# Patient Record
Sex: Female | Born: 1955
Health system: Southern US, Community
[De-identification: ages and names within clinical notes are randomized; demographics above are authoritative.]

## PROBLEM LIST (undated history)

## (undated) DIAGNOSIS — G4730 Sleep apnea, unspecified: Secondary | ICD-10-CM

## (undated) DIAGNOSIS — Z8489 Family history of other specified conditions: Secondary | ICD-10-CM

## (undated) DIAGNOSIS — Z86718 Personal history of other venous thrombosis and embolism: Secondary | ICD-10-CM

## (undated) DIAGNOSIS — R002 Palpitations: Secondary | ICD-10-CM

## (undated) DIAGNOSIS — M549 Dorsalgia, unspecified: Secondary | ICD-10-CM

## (undated) DIAGNOSIS — K219 Gastro-esophageal reflux disease without esophagitis: Secondary | ICD-10-CM

## (undated) DIAGNOSIS — I10 Essential (primary) hypertension: Secondary | ICD-10-CM

## (undated) DIAGNOSIS — Z800 Family history of malignant neoplasm of digestive organs: Secondary | ICD-10-CM

## (undated) DIAGNOSIS — Z923 Personal history of irradiation: Secondary | ICD-10-CM

## (undated) DIAGNOSIS — E785 Hyperlipidemia, unspecified: Secondary | ICD-10-CM

## (undated) DIAGNOSIS — Z803 Family history of malignant neoplasm of breast: Secondary | ICD-10-CM

## (undated) DIAGNOSIS — R600 Localized edema: Secondary | ICD-10-CM

## (undated) DIAGNOSIS — I471 Supraventricular tachycardia, unspecified: Secondary | ICD-10-CM

## (undated) DIAGNOSIS — K5900 Constipation, unspecified: Secondary | ICD-10-CM

## (undated) DIAGNOSIS — R011 Cardiac murmur, unspecified: Secondary | ICD-10-CM

## (undated) DIAGNOSIS — C801 Malignant (primary) neoplasm, unspecified: Secondary | ICD-10-CM

## (undated) HISTORY — DX: Supraventricular tachycardia, unspecified: I47.10

## (undated) HISTORY — DX: Constipation, unspecified: K59.00

## (undated) HISTORY — DX: Personal history of other venous thrombosis and embolism: Z86.718

## (undated) HISTORY — DX: Family history of malignant neoplasm of digestive organs: Z80.0

## (undated) HISTORY — DX: Family history of malignant neoplasm of breast: Z80.3

## (undated) HISTORY — DX: Localized edema: R60.0

## (undated) HISTORY — PX: HERNIA REPAIR: SHX51

## (undated) HISTORY — DX: Supraventricular tachycardia: I47.1

## (undated) HISTORY — DX: Dorsalgia, unspecified: M54.9

## (undated) HISTORY — DX: Palpitations: R00.2

## (undated) HISTORY — PX: ABDOMINAL HYSTERECTOMY: SHX81

---

## 1997-05-20 ENCOUNTER — Encounter: Admission: RE | Admit: 1997-05-20 | Discharge: 1997-05-20 | Payer: Self-pay | Admitting: Family Medicine

## 1997-05-25 ENCOUNTER — Encounter: Admission: RE | Admit: 1997-05-25 | Discharge: 1997-05-25 | Payer: Self-pay | Admitting: Family Medicine

## 1997-07-18 ENCOUNTER — Encounter: Admission: RE | Admit: 1997-07-18 | Discharge: 1997-07-18 | Payer: Self-pay | Admitting: Family Medicine

## 1997-08-10 ENCOUNTER — Encounter: Admission: RE | Admit: 1997-08-10 | Discharge: 1997-08-10 | Payer: Self-pay | Admitting: Family Medicine

## 2001-12-31 ENCOUNTER — Encounter: Payer: Self-pay | Admitting: Emergency Medicine

## 2001-12-31 ENCOUNTER — Emergency Department (HOSPITAL_COMMUNITY): Admission: EM | Admit: 2001-12-31 | Discharge: 2002-01-01 | Payer: Self-pay | Admitting: Emergency Medicine

## 2002-06-23 ENCOUNTER — Encounter: Admission: RE | Admit: 2002-06-23 | Discharge: 2002-06-23 | Payer: Self-pay | Admitting: Internal Medicine

## 2002-06-23 ENCOUNTER — Encounter: Payer: Self-pay | Admitting: Internal Medicine

## 2003-05-20 ENCOUNTER — Other Ambulatory Visit: Admission: RE | Admit: 2003-05-20 | Discharge: 2003-05-20 | Payer: Self-pay | Admitting: Obstetrics and Gynecology

## 2004-04-14 ENCOUNTER — Emergency Department (HOSPITAL_COMMUNITY): Admission: EM | Admit: 2004-04-14 | Discharge: 2004-04-14 | Payer: Self-pay | Admitting: Emergency Medicine

## 2007-01-30 DIAGNOSIS — J309 Allergic rhinitis, unspecified: Secondary | ICD-10-CM | POA: Insufficient documentation

## 2007-01-30 DIAGNOSIS — E1169 Type 2 diabetes mellitus with other specified complication: Secondary | ICD-10-CM | POA: Insufficient documentation

## 2007-01-30 DIAGNOSIS — I10 Essential (primary) hypertension: Secondary | ICD-10-CM | POA: Insufficient documentation

## 2007-01-30 DIAGNOSIS — E785 Hyperlipidemia, unspecified: Secondary | ICD-10-CM | POA: Insufficient documentation

## 2008-10-27 ENCOUNTER — Encounter: Admission: RE | Admit: 2008-10-27 | Discharge: 2008-10-27 | Payer: Self-pay | Admitting: Internal Medicine

## 2011-03-17 ENCOUNTER — Other Ambulatory Visit: Payer: Self-pay

## 2011-03-17 ENCOUNTER — Observation Stay (HOSPITAL_COMMUNITY)
Admission: EM | Admit: 2011-03-17 | Discharge: 2011-03-18 | Disposition: A | Discharge status: Home / Self Care | Payer: 59 | Attending: Internal Medicine | Admitting: Internal Medicine

## 2011-03-17 ENCOUNTER — Encounter (HOSPITAL_COMMUNITY): Payer: Self-pay | Admitting: *Deleted

## 2011-03-17 DIAGNOSIS — R000 Tachycardia, unspecified: Secondary | ICD-10-CM

## 2011-03-17 DIAGNOSIS — I498 Other specified cardiac arrhythmias: Principal | ICD-10-CM | POA: Insufficient documentation

## 2011-03-17 DIAGNOSIS — I10 Essential (primary) hypertension: Secondary | ICD-10-CM | POA: Insufficient documentation

## 2011-03-17 DIAGNOSIS — I471 Supraventricular tachycardia: Secondary | ICD-10-CM

## 2011-03-17 DIAGNOSIS — R002 Palpitations: Secondary | ICD-10-CM | POA: Insufficient documentation

## 2011-03-17 DIAGNOSIS — E876 Hypokalemia: Secondary | ICD-10-CM | POA: Insufficient documentation

## 2011-03-17 DIAGNOSIS — E785 Hyperlipidemia, unspecified: Secondary | ICD-10-CM | POA: Insufficient documentation

## 2011-03-17 DIAGNOSIS — E119 Type 2 diabetes mellitus without complications: Secondary | ICD-10-CM

## 2011-03-17 DIAGNOSIS — R079 Chest pain, unspecified: Secondary | ICD-10-CM | POA: Insufficient documentation

## 2011-03-17 DIAGNOSIS — E1169 Type 2 diabetes mellitus with other specified complication: Secondary | ICD-10-CM | POA: Insufficient documentation

## 2011-03-17 DIAGNOSIS — R0602 Shortness of breath: Secondary | ICD-10-CM | POA: Insufficient documentation

## 2011-03-17 HISTORY — DX: Hyperlipidemia, unspecified: E78.5

## 2011-03-17 HISTORY — DX: Essential (primary) hypertension: I10

## 2011-03-17 LAB — DIFFERENTIAL
Basophils Absolute: 0.1 10*3/uL (ref 0.0–0.1)
Basophils Relative: 1 % (ref 0–1)
Eosinophils Absolute: 0.2 10*3/uL (ref 0.0–0.7)
Eosinophils Relative: 3 % (ref 0–5)
Lymphocytes Relative: 24 % (ref 12–46)
Lymphs Abs: 2 10*3/uL (ref 0.7–4.0)
Monocytes Absolute: 0.6 10*3/uL (ref 0.1–1.0)
Monocytes Relative: 7 % (ref 3–12)
Neutro Abs: 5.5 10*3/uL (ref 1.7–7.7)
Neutrophils Relative %: 65 % (ref 43–77)

## 2011-03-17 LAB — CBC
HCT: 39.8 % (ref 36.0–46.0)
HCT: 36.6 % (ref 36.0–46.0)
Hemoglobin: 13.2 g/dL (ref 12.0–15.0)
Hemoglobin: 11.9 g/dL — ABNORMAL LOW (ref 12.0–15.0)
MCH: 26.6 pg (ref 26.0–34.0)
MCH: 26 pg (ref 26.0–34.0)
MCHC: 33.2 g/dL (ref 30.0–36.0)
MCHC: 32.5 g/dL (ref 30.0–36.0)
MCV: 80.2 fL (ref 78.0–100.0)
MCV: 80.1 fL (ref 78.0–100.0)
Platelets: 265 10*3/uL (ref 150–400)
Platelets: 242 10*3/uL (ref 150–400)
RBC: 4.96 MIL/uL (ref 3.87–5.11)
RBC: 4.57 MIL/uL (ref 3.87–5.11)
RDW: 14.4 % (ref 11.5–15.5)
RDW: 14.5 % (ref 11.5–15.5)
WBC: 8.4 10*3/uL (ref 4.0–10.5)
WBC: 8.9 10*3/uL (ref 4.0–10.5)

## 2011-03-17 LAB — TROPONIN I
Troponin I: 0.37 ng/mL (ref <0.30)
Troponin I: 0.36 ng/mL (ref <0.30)

## 2011-03-17 LAB — PROTIME-INR
INR: 0.91 (ref 0.00–1.49)
Prothrombin Time: 12.5 seconds (ref 11.6–15.2)

## 2011-03-17 LAB — BASIC METABOLIC PANEL
BUN: 21 mg/dL (ref 6–23)
CO2: 25 mEq/L (ref 19–32)
Calcium: 10.1 mg/dL (ref 8.4–10.5)
Chloride: 101 mEq/L (ref 96–112)
Creatinine, Ser: 1.12 mg/dL — ABNORMAL HIGH (ref 0.50–1.10)
GFR calc Af Amer: 63 mL/min — ABNORMAL LOW (ref >90)
GFR calc non Af Amer: 54 mL/min — ABNORMAL LOW (ref >90)
Glucose, Bld: 95 mg/dL (ref 70–99)
Potassium: 3 mEq/L — ABNORMAL LOW (ref 3.5–5.1)
Sodium: 140 mEq/L (ref 135–145)

## 2011-03-17 LAB — CARDIAC PANEL(CRET KIN+CKTOT+MB+TROPI)
CK, MB: 2.3 ng/mL (ref 0.3–4.0)
Relative Index: 0.8 (ref 0.0–2.5)
Total CK: 295 U/L — ABNORMAL HIGH (ref 7–177)
Troponin I: 0.37 ng/mL (ref <0.30)

## 2011-03-17 LAB — MAGNESIUM: Magnesium: 1.9 mg/dL (ref 1.5–2.5)

## 2011-03-17 LAB — CREATININE, SERUM
Creatinine, Ser: 0.98 mg/dL (ref 0.50–1.10)
GFR calc Af Amer: 74 mL/min — ABNORMAL LOW (ref >90)
GFR calc non Af Amer: 64 mL/min — ABNORMAL LOW (ref >90)

## 2011-03-17 MED ORDER — ROSUVASTATIN CALCIUM 20 MG PO TABS
20.0000 mg | ORAL_TABLET | Freq: Every day | ORAL | Status: DC
  Administered 2011-03-17 – 2011-03-18 (×2): 20 mg via ORAL
  Filled 2011-03-17 (×3): qty 1

## 2011-03-17 MED ORDER — HYDROCHLOROTHIAZIDE 25 MG PO TABS
25.0000 mg | ORAL_TABLET | Freq: Every day | ORAL | Status: DC
  Administered 2011-03-18: 25 mg via ORAL
  Filled 2011-03-17: qty 1

## 2011-03-17 MED ORDER — POTASSIUM CHLORIDE CRYS ER 20 MEQ PO TBCR
40.0000 meq | EXTENDED_RELEASE_TABLET | Freq: Every day | ORAL | Status: DC
  Administered 2011-03-18: 40 meq via ORAL
  Filled 2011-03-17: qty 2

## 2011-03-17 MED ORDER — ONDANSETRON HCL 4 MG/2ML IJ SOLN: 4.0000 mg | Freq: Four times a day (QID) | INTRAMUSCULAR | Status: DC | PRN

## 2011-03-17 MED ORDER — NITROGLYCERIN 0.4 MG SL SUBL: 0.4000 mg | SUBLINGUAL_TABLET | SUBLINGUAL | Status: DC | PRN

## 2011-03-17 MED ORDER — OLMESARTAN MEDOXOMIL 20 MG PO TABS
20.0000 mg | ORAL_TABLET | Freq: Every day | ORAL | Status: DC
  Administered 2011-03-18: 20 mg via ORAL
  Filled 2011-03-17: qty 1

## 2011-03-17 MED ORDER — ACETAMINOPHEN 325 MG PO TABS: 650.0000 mg | ORAL_TABLET | ORAL | Status: DC | PRN

## 2011-03-17 MED ORDER — POTASSIUM CHLORIDE 20 MEQ PO PACK
40.0000 meq | PACK | Freq: Once | ORAL | Status: DC
  Filled 2011-03-17: qty 2

## 2011-03-17 MED ORDER — POTASSIUM CHLORIDE 20 MEQ PO PACK
40.0000 meq | PACK | Freq: Every day | ORAL | Status: DC
  Filled 2011-03-17: qty 2

## 2011-03-17 MED ORDER — ASPIRIN 81 MG PO CHEW
324.0000 mg | CHEWABLE_TABLET | ORAL | Status: AC
  Administered 2011-03-17: 324 mg via ORAL
  Filled 2011-03-17: qty 4

## 2011-03-17 MED ORDER — HEPARIN SODIUM (PORCINE) 5000 UNIT/ML IJ SOLN
5000.0000 [IU] | Freq: Three times a day (TID) | INTRAMUSCULAR | Status: DC
  Administered 2011-03-17 – 2011-03-18 (×2): 5000 [IU] via SUBCUTANEOUS
  Filled 2011-03-17 (×5): qty 1

## 2011-03-17 MED ORDER — SODIUM CHLORIDE 0.9 % IJ SOLN: 3.0000 mL | INTRAMUSCULAR | Status: DC | PRN

## 2011-03-17 MED ORDER — ASPIRIN EC 325 MG PO TBEC
325.0000 mg | DELAYED_RELEASE_TABLET | Freq: Every day | ORAL | Status: DC
  Administered 2011-03-18: 325 mg via ORAL
  Filled 2011-03-17: qty 1

## 2011-03-17 MED ORDER — POTASSIUM CHLORIDE CRYS ER 20 MEQ PO TBCR
40.0000 meq | EXTENDED_RELEASE_TABLET | Freq: Once | ORAL | Status: AC
  Administered 2011-03-17: 40 meq via ORAL
  Filled 2011-03-17: qty 2

## 2011-03-17 MED ORDER — SODIUM CHLORIDE 0.9 % IV SOLN: 250.0000 mL | INTRAVENOUS | Status: DC | PRN

## 2011-03-17 MED ORDER — ASPIRIN 300 MG RE SUPP: 300.0000 mg | RECTAL | Status: AC

## 2011-03-17 MED ORDER — VALSARTAN-HYDROCHLOROTHIAZIDE 160-25 MG PO TABS: 1.0000 | ORAL_TABLET | Freq: Every day | ORAL | Status: DC

## 2011-03-17 MED ORDER — SODIUM CHLORIDE 0.9 % IJ SOLN
3.0000 mL | Freq: Two times a day (BID) | INTRAMUSCULAR | Status: DC
  Administered 2011-03-17 – 2011-03-18 (×2): 3 mL via INTRAVENOUS

## 2011-03-17 MED ORDER — POTASSIUM CHLORIDE CRYS ER 20 MEQ PO TBCR
40.0000 meq | EXTENDED_RELEASE_TABLET | Freq: Once | ORAL | Status: AC
  Administered 2011-03-18: 40 meq via ORAL
  Filled 2011-03-17: qty 2

## 2011-03-17 MED ORDER — ADULT MULTIVITAMIN W/MINERALS CH
1.0000 | ORAL_TABLET | Freq: Every day | ORAL | Status: DC
  Administered 2011-03-17 – 2011-03-18 (×2): 1 via ORAL
  Filled 2011-03-17 (×2): qty 1

## 2011-03-17 NOTE — ED Notes (Signed)
No distress at this time.  NSR on monitor.  No CP and no SOB, tachycardia at work was resolved with vagal maneuver.

## 2011-03-17 NOTE — ED Provider Notes (Signed)
History     CSN: [id number]  Arrival date & time 03/17/11  1713   First MD Initiated Contact with Patient 03/17/11 1716      No chief complaint on file.   (Consider location/radiation/quality/duration/timing/severity/associated sxs/prior treatment) Patient is a 56 y.o. female presenting with palpitations. The history is provided by the patient and the EMS personnel.  Palpitations  This is a new problem. The current episode started less than 1 hour ago. Episode frequency: first time. The problem has been resolved. Associated with: Occurred at rest. On average, each episode lasts 30 minutes. Associated symptoms include irregular heartbeat (He described his heart pounding) and weakness (Generalized). Pertinent negatives include no diaphoresis, no fever, no numbness, no chest pain, no chest pressure, no near-syncope, no syncope, no abdominal pain, no nausea, no vomiting, no back pain, no dizziness and no shortness of breath. Associated symptoms comments: Felt warm all over. She has tried nothing for the symptoms. Risk factors include diabetes mellitus and dyslipidemia. Her past medical history does not include hyperthyroidism.    Past Medical History  Diagnosis Date  . Diabetes mellitus   . Hypertension   . Hyperlipemia     No past surgical history on file.  No family history on file.  History  Substance Use Topics  . Smoking status: Not on file  . Smokeless tobacco: Not on file  . Alcohol Use: Not on file    OB History    Grav Para Term Preterm Abortions TAB SAB Ect Mult Living                  Review of Systems  Constitutional: Negative for fever, chills and diaphoresis.  HENT: Negative for congestion, sore throat and rhinorrhea.   Eyes: Negative for pain, redness and visual disturbance.  Respiratory: Negative for chest tightness and shortness of breath.   Cardiovascular: Positive for palpitations. Negative for chest pain, syncope and near-syncope.  Gastrointestinal:  Negative for nausea, vomiting, abdominal pain, diarrhea, constipation and rectal pain.  Genitourinary: Negative for dysuria and difficulty urinating.  Musculoskeletal: Negative for back pain and arthralgias.  Skin: Negative for rash and wound.  Neurological: Positive for weakness (Generalized). Negative for dizziness and numbness.  Psychiatric/Behavioral: Negative for confusion and dysphoric mood.  All other systems reviewed and are negative.    Allergies  Codeine and Meloxicam  Home Medications  No current outpatient prescriptions on file.  BP 131/80  Pulse 91  Temp 98.5 F (36.9 C)  Resp 20  SpO2 100%  Physical Exam  Nursing note and vitals reviewed. Constitutional: She is oriented to person, place, and time. She appears well-developed and well-nourished. No distress.  HENT:  Head: Normocephalic and atraumatic.  Mouth/Throat: Oropharynx is clear and moist.  Eyes: EOM are normal. Pupils are equal, round, and reactive to light.  Neck: Normal range of motion. Neck supple.  Cardiovascular: Normal rate, regular rhythm and normal heart sounds.  Exam reveals no friction rub.   No murmur heard. Pulmonary/Chest: Effort normal and breath sounds normal. No respiratory distress. She has no wheezes. She has no rales.  Abdominal: Soft. There is no tenderness. There is no rebound and no guarding.  Musculoskeletal: Normal range of motion. She exhibits no edema and no tenderness.  Lymphadenopathy:    She has no cervical adenopathy.  Neurological: She is alert and oriented to person, place, and time.  Skin: Skin is warm and dry. No rash noted.  Psychiatric: She has a normal mood and affect. Her behavior is normal.  ED Course  Procedures (including critical care time)  Results for orders placed during the hospital encounter of 03/17/11  CBC      Component Value Range   WBC 8.4  4.0 - 10.5 (K/uL)   RBC 4.96  3.87 - 5.11 (MIL/uL)   Hemoglobin 13.2  12.0 - 15.0 (g/dL)   HCT 16.1   09.6 - 04.5 (%)   MCV 80.2  78.0 - 100.0 (fL)   MCH 26.6  26.0 - 34.0 (pg)   MCHC 33.2  30.0 - 36.0 (g/dL)   RDW 40.9  81.1 - 91.4 (%)   Platelets 265  150 - 400 (K/uL)  DIFFERENTIAL      Component Value Range   Neutrophils Relative 65  43 - 77 (%)   Neutro Abs 5.5  1.7 - 7.7 (K/uL)   Lymphocytes Relative 24  12 - 46 (%)   Lymphs Abs 2.0  0.7 - 4.0 (K/uL)   Monocytes Relative 7  3 - 12 (%)   Monocytes Absolute 0.6  0.1 - 1.0 (K/uL)   Eosinophils Relative 3  0 - 5 (%)   Eosinophils Absolute 0.2  0.0 - 0.7 (K/uL)   Basophils Relative 1  0 - 1 (%)   Basophils Absolute 0.1  0.0 - 0.1 (K/uL)  BASIC METABOLIC PANEL      Component Value Range   Sodium 140  135 - 145 (mEq/L)   Potassium 3.0 (*) 3.5 - 5.1 (mEq/L)   Chloride 101  96 - 112 (mEq/L)   CO2 25  19 - 32 (mEq/L)   Glucose, Bld 95  70 - 99 (mg/dL)   BUN 21  6 - 23 (mg/dL)   Creatinine, Ser 7.82 (*) 0.50 - 1.10 (mg/dL)   Calcium 95.6  8.4 - 10.5 (mg/dL)   GFR calc non Af Amer 54 (*) >90 (mL/min)   GFR calc Af Amer 63 (*) >90 (mL/min)  TSH      Component Value Range   TSH 1.379  0.350 - 4.500 (uIU/mL)  T4, FREE      Component Value Range   Free T4 1.14  0.80 - 1.80 (ng/dL)  TROPONIN I      Component Value Range   Troponin I 0.37 (*) <0.30 (ng/mL)  TROPONIN I      Component Value Range   Troponin I 0.36 (*) <0.30 (ng/mL)  CARDIAC PANEL(CRET KIN+CKTOT+MB+TROPI)      Component Value Range   Total CK 295 (*) 7 - 177 (U/L)   CK, MB 2.3  0.3 - 4.0 (ng/mL)   Troponin I 0.37 (*) <0.30 (ng/mL)   Relative Index 0.8  0.0 - 2.5   PROTIME-INR      Component Value Range   Prothrombin Time 12.5  11.6 - 15.2 (seconds)   INR 0.91  0.00 - 1.49   MAGNESIUM      Component Value Range   Magnesium 1.9  1.5 - 2.5 (mg/dL)  CBC      Component Value Range   WBC 8.9  4.0 - 10.5 (K/uL)   RBC 4.57  3.87 - 5.11 (MIL/uL)   Hemoglobin 11.9 (*) 12.0 - 15.0 (g/dL)   HCT 21.3  08.6 - 57.8 (%)   MCV 80.1  78.0 - 100.0 (fL)   MCH 26.0  26.0 -  34.0 (pg)   MCHC 32.5  30.0 - 36.0 (g/dL)   RDW 46.9  62.9 - 52.8 (%)   Platelets 242  150 - 400 (K/uL)  CREATININE, SERUM  Component Value Range   Creatinine, Ser 0.98  0.50 - 1.10 (mg/dL)   GFR calc non Af Amer 64 (*) >90 (mL/min)   GFR calc Af Amer 74 (*) >90 (mL/min)     Date: 03/18/2011  Rate: 90  Rhythm: normal sinus rhythm  QRS Axis: normal  Intervals: normal  ST/T Wave abnormalities: nonspecific T wave changes  Conduction Disutrbances:none  Narrative Interpretation:   Old EKG Reviewed: none available     Clinical impression:  1. Chest pain 2. Supraventricular impression    MDM  57:19 PM 56 year old female with a history of hypertension, diabetes, hyperlipidemia here with palpitations. She reports that she was at work and [employer], [employer] when she'll the sudden felt palpitations. She also felt warm all over. She denies any chest pain shortness of breath nausea or vomiting. She states nurses at her facility took her heart rate her heart rate was in the 180s. This episode lasted roughly 30 minutes and resolved on its own. She is has been in normal sinus rhythm with a normal heart rate and rhythm for EMS. She is stable here and has no complaints. Her original low 90s currently. She denies any history of heart dysrhythmias or SVT. Her vitals are stable. Her lungs are clear. She does not drink a significant amount of caffeine. She's had no recent fever or illness. We'll check a CBC, BMP, TSH and free T4 and reassess.  Patient had a mildly elevated troponin at 0.36. Labs are otherwise unremarkable. Given that she did have some mild chest pain along with the fact that she is 41, with a history of diabetes, hypertension and ischemia cardiology was consulted and patient was admitted for further management.      Sheran Luz, MD 03/18/11 [id number]

## 2011-03-17 NOTE — ED Notes (Signed)
KENDERID: pt. Went into SVT for 30 minutes and converted on own. SVT HR 184. Pt. Asymptomatic. No cp, no cop, no diaphoresis and n/v. VS: 140/90, 88, 17, 100 l [place] 2l .piv 20ga rt. A/c. aler and oriented.

## 2011-03-17 NOTE — H&P (Signed)
Primary Cardiologist: None  CC: Narrow complex tachycardia at 180 BPM, elevated troponins  HPI: 56 YOAAF with PMH of DM, HTN, HLD who presents for evaluation of narrow complex tachycardia and chest pain.  She was in her usual state of health today and was working at [employer]).  She developed acute onset of tachypalpitations with associated chest pressure and shortness of breath.  She has pre-syncope but no syncope.  This last 20 minutes and resolved with vagal maneuvers.  She was placed on a monitor, but unfortunately, a 12-lead EKG was not obtained.  We do have 5 seconds of a telemetry strip that shows a narrow complex tachycardia at 180 BPM.  She denies chest pain currently.   Past Medical History  Diagnosis Date  . Diabetes mellitus   . Hypertension   . Hyperlipemia    SOCIAL HISTORY: The patient denies tobacco, alcohol or drug use.   FAMILY HISTORY: There is no family history of arrythmia.  Her dad died of MI at age 39.    Review of Systems: All systems reviewed and are negative except as mentioned above in the history of present illness.   MEDICATIONS: 1. Lipitor 40mg  daily 2. Metformin 500mg  BID 3. MVI daily 4. Diovan-HCT 160/25mg  daily  ALLERGIES: none  PHYSICAL EXAM: Filed Vitals:   03/17/11 2000  BP: 105/61  Pulse: 72  Temp:   Resp: 14   GENERAL: No acute distress.   HEENT: Normocephalic, atraumatic.  Oropharynx is pink and moist without lesions.  NECK: Supple, no LAD, no JVD, no masses. CV: Regular rate and rhythm with no murmurs, rubs, or gallops.   LUNGS: Clear to auscultation bilaterally.   ABDOMEN: +BS, soft, nontender, nondistended.  EXTREMITIES: No clubbing, cyanosis, or edema.   NEURO: AO x 3, no focal deficits. PYSCH: Normal affect. SKIN: No rashes.    ECG: 1: tele strip from OSH - narrow complex tachycardia at 180 BPM 2: 1720 - NSR 90 BPM with nonspecific T-wave changes V3-V6  Results for orders placed during the hospital encounter of  03/17/11 (from the past 24 hour(s))  CBC     Status: Normal   Collection Time   03/17/11  5:41 PM      Component Value Range   WBC 8.4  4.0 - 10.5 (K/uL)   RBC 4.96  3.87 - 5.11 (MIL/uL)   Hemoglobin 13.2  12.0 - 15.0 (g/dL)   HCT 16.1  09.6 - 04.5 (%)   MCV 80.2  78.0 - 100.0 (fL)   MCH 26.6  26.0 - 34.0 (pg)   MCHC 33.2  30.0 - 36.0 (g/dL)   RDW 40.9  81.1 - 91.4 (%)   Platelets 265  150 - 400 (K/uL)  DIFFERENTIAL     Status: Normal   Collection Time   03/17/11  5:41 PM      Component Value Range   Neutrophils Relative 65  43 - 77 (%)   Neutro Abs 5.5  1.7 - 7.7 (K/uL)   Lymphocytes Relative 24  12 - 46 (%)   Lymphs Abs 2.0  0.7 - 4.0 (K/uL)   Monocytes Relative 7  3 - 12 (%)   Monocytes Absolute 0.6  0.1 - 1.0 (K/uL)   Eosinophils Relative 3  0 - 5 (%)   Eosinophils Absolute 0.2  0.0 - 0.7 (K/uL)   Basophils Relative 1  0 - 1 (%)   Basophils Absolute 0.1  0.0 - 0.1 (K/uL)  BASIC METABOLIC PANEL  Status: Abnormal   Collection Time   03/17/11  5:41 PM      Component Value Range   Sodium 140  135 - 145 (mEq/L)   Potassium 3.0 (*) 3.5 - 5.1 (mEq/L)   Chloride 101  96 - 112 (mEq/L)   CO2 25  19 - 32 (mEq/L)   Glucose, Bld 95  70 - 99 (mg/dL)   BUN 21  6 - 23 (mg/dL)   Creatinine, Ser 0.98 (*) 0.50 - 1.10 (mg/dL)   Calcium 11.9  8.4 - 10.5 (mg/dL)   GFR calc non Af Amer 54 (*) >90 (mL/min)   GFR calc Af Amer 63 (*) >90 (mL/min)  TROPONIN I     Status: Abnormal   Collection Time   03/17/11  5:48 PM      Component Value Range   Troponin I 0.37 (*) <0.30 (ng/mL)   ASSESSMENT and PLAN:  89 YOAAF with PMH of DM, HTN, HLD who presents for evaluation of symptomatic narrow complex tachycardia (180 BPM) and chest pain which occurred in the setting of hypokalemia (K = 3.0)  1: Admit to [facility] - unassigned ([facility])  2: Symptomatic narrow complex tachycardia - it is unclear what type of SVT this is, as we do not have a 12-lead EKG.  This was likely precipitated by her  hypokalemia.  We will replace her potassium and start her on supplementation as she takes a diuretic.  Will start low dose beta-blocker (lopressor 25mg  BID).  Check TSH and magnesium.  Her elevated troponin is likely secondary to demand ischemia from her SVT (as opposed to an ACS) - however, in light of her risk factors and T-wave changes, I feel that she should be monitored overnight and ruled out for MI with CE.  Will start ASA 325mg  daily.  Consider outpatient EPS +/- ablation if she has a recurrence on beta-blockers.  3: HLD - check FLP, continue lipitor  4: HTN - continue home medications  5: DM - hold metformin, check A1c  6: FEN: SLIVF, Hypokalemia - will replaced potassium and repeat BMP, NPO after midnight.    7: DVT prophylaxis: sq heparin.  Bernestine Amass. Mayford Knife, MD Level 3 Admission

## 2011-03-17 NOTE — ED Notes (Signed)
Lab called with critical troponin,  ermd made aware of results.

## 2011-03-17 NOTE — ED Notes (Signed)
Report given to 2000 

## 2011-03-18 ENCOUNTER — Other Ambulatory Visit: Payer: Self-pay

## 2011-03-18 ENCOUNTER — Encounter (HOSPITAL_COMMUNITY): Payer: Self-pay | Admitting: *Deleted

## 2011-03-18 DIAGNOSIS — I10 Essential (primary) hypertension: Secondary | ICD-10-CM

## 2011-03-18 DIAGNOSIS — E119 Type 2 diabetes mellitus without complications: Secondary | ICD-10-CM

## 2011-03-18 DIAGNOSIS — E876 Hypokalemia: Secondary | ICD-10-CM | POA: Diagnosis present

## 2011-03-18 DIAGNOSIS — I498 Other specified cardiac arrhythmias: Secondary | ICD-10-CM

## 2011-03-18 DIAGNOSIS — I471 Supraventricular tachycardia: Secondary | ICD-10-CM | POA: Diagnosis present

## 2011-03-18 LAB — BASIC METABOLIC PANEL
BUN: 19 mg/dL (ref 6–23)
CO2: 27 mEq/L (ref 19–32)
Calcium: 9.6 mg/dL (ref 8.4–10.5)
Chloride: 104 mEq/L (ref 96–112)
Creatinine, Ser: 0.91 mg/dL (ref 0.50–1.10)
GFR calc Af Amer: 81 mL/min — ABNORMAL LOW (ref >90)
GFR calc non Af Amer: 70 mL/min — ABNORMAL LOW (ref >90)
Glucose, Bld: 106 mg/dL — ABNORMAL HIGH (ref 70–99)
Potassium: 3.5 mEq/L (ref 3.5–5.1)
Sodium: 139 mEq/L (ref 135–145)

## 2011-03-18 LAB — TSH
TSH: 1.379 u[IU]/mL (ref 0.350–4.500)
TSH: 2.664 u[IU]/mL (ref 0.350–4.500)

## 2011-03-18 LAB — HEMOGLOBIN A1C
Hgb A1c MFr Bld: 6.1 % — ABNORMAL HIGH (ref <5.7)
Mean Plasma Glucose: 128 mg/dL — ABNORMAL HIGH (ref <117)

## 2011-03-18 LAB — CARDIAC PANEL(CRET KIN+CKTOT+MB+TROPI)
CK, MB: 2.3 ng/mL (ref 0.3–4.0)
CK, MB: 2.2 ng/mL (ref 0.3–4.0)
Relative Index: 0.8 (ref 0.0–2.5)
Relative Index: 0.8 (ref 0.0–2.5)
Total CK: 277 U/L — ABNORMAL HIGH (ref 7–177)
Total CK: 274 U/L — ABNORMAL HIGH (ref 7–177)
Troponin I: 0.36 ng/mL (ref <0.30)
Troponin I: 0.32 ng/mL (ref <0.30)

## 2011-03-18 LAB — LIPID PANEL
Cholesterol: 163 mg/dL (ref 0–200)
HDL: 49 mg/dL (ref >39)
LDL Cholesterol: 91 mg/dL (ref 0–99)
Total CHOL/HDL Ratio: 3.3 RATIO
Triglycerides: 114 mg/dL (ref <150)
VLDL: 23 mg/dL (ref 0–40)

## 2011-03-18 LAB — T4, FREE: Free T4: 1.14 ng/dL (ref 0.80–1.80)

## 2011-03-18 MED ORDER — ASPIRIN 81 MG PO TBEC: 81.0000 mg | DELAYED_RELEASE_TABLET | Freq: Every day | ORAL | Status: AC

## 2011-03-18 MED ORDER — DILTIAZEM HCL ER COATED BEADS 120 MG PO CP24: 120.0000 mg | ORAL_CAPSULE | Freq: Every day | ORAL | Status: DC

## 2011-03-18 MED ORDER — POTASSIUM CHLORIDE CRYS ER 20 MEQ PO TBCR: 20.0000 meq | EXTENDED_RELEASE_TABLET | Freq: Every day | ORAL | Status: DC

## 2011-03-18 MED ORDER — NITROGLYCERIN 0.4 MG SL SUBL: 0.4000 mg | SUBLINGUAL_TABLET | SUBLINGUAL | Status: DC | PRN

## 2011-03-18 NOTE — Discharge Summary (Signed)
Patient ID: Melody Zhang,  MRN: [id number], DOB/AGE: [birth year] 56 y.o.  Admit date: 03/17/2011 Discharge date: 03/18/2011  Primary Cardiologist: J. Jacquita Mulhearn  Discharge Diagnoses Principal Problem:  *SVT (supraventricular tachycardia) Active Problems:  HYPERLIPIDEMIA  HYPERTENSION  Hypokalemia  Diabetes mellitus   Allergies No Known Allergies  Procedures  None  History of Present Illness  56 y/o female with the above problem list.  She was in her usoh, until the evening of 03/17/2011 when, while @ work, she had sudden onset of tachy-palpitations associated with sob and chest pain.  Pt was attached to a monitor (works @ [employer]) and rhythm strip was captured and showed SVT.  Vagal maneuvers were performed and SVT broke within approximately 20 minutes.  Pt was taken to the [facility] ED for further evaluation where she was found to have mild troponin elevation and she was admitted for further evaluation.  Hospital Course  Pt has had no further SVT, c/p, or dyspnea.  Her troponins have remained mildly elevated with a flat trend (0.36 - 0.37 - 0.36) and normal CK- MB's.  We have initiated low-dose Diltiazem therapy along with ASA.  Further, we have added KCl to her daily regimen secondary to hypokalemia noted during this admission.  In the setting of mild troponin elevation, we have arranged for a BMET later this week and an exercise myoview next week.  She is being discharged today in good condition.  Discharge Vitals Blood pressure 117/65, pulse 62, temperature 98 F (36.7 C), temperature source Oral, resp. rate 18, height 5\' 7"  (1.702 m), weight 255 lb 1.2 oz (115.7 kg), SpO2 97.00%.  Filed Weights   03/17/11 2342 03/18/11 0012  Weight: 255 lb 1.2 oz (115.7 kg) 255 lb 1.2 oz (115.7 kg)    Labs  CBC  Basename 03/17/11 2210 03/17/11 1741  WBC 8.9 8.4  NEUTROABS -- 5.5  HGB 11.9* 13.2  HCT 36.6 39.8  MCV 80.1 80.2  PLT 242 265   Basic Metabolic Panel  Basename  03/18/11 0331 03/17/11 2210 03/17/11 1741  NA 139 -- 140  K 3.5 -- 3.0*  CL 104 -- 101  CO2 27 -- 25  GLUCOSE 106* -- 95  BUN 19 -- 21  CREATININE 0.91 0.98 --  CALCIUM 9.6 -- 10.1  MG -- 1.9 --  PHOS -- -- --   Cardiac Enzymes  Basename 03/18/11 0331 03/17/11 2215 03/17/11 2042  CKTOTAL 277* 295* --  CKMB 2.3 2.3 --  CKMBINDEX -- -- --  TROPONINI 0.36* 0.37* 0.36*   Fasting Lipid Panel  Basename 03/18/11 0331  CHOL 163  HDL 49  LDLCALC 91  TRIG 114  CHOLHDL 3.3  LDLDIRECT --   Thyroid Function Tests  Basename 03/17/11 1741  TSH 1.379  T4TOTAL --  T3FREE --  THYROIDAB --    Disposition  Pt is being discharged home today in good condition.  Follow-up Plans & Appointments  Follow-up Information    [facility] with  [facility] on 03/[facility]/2011. (8:15am for stress test  - nothing to eat or drink after midnight.  No caffeine on 2/26.)    Contact information:   [address] [place] [phone]      Follow up with Tereso Newcomer, PA on 04/12/2011. (10:30am)    Contact information:   [address] [place] [address] [phone]       Follow up with [facility] on 03/20/2011. (Blood chemistry to follow-up potassium - anytime after 8:30am)  Contact information:   [address] [place] [phone]         Discharge Medications  Medication List  As of 03/18/2011  8:51 AM   TAKE these medications         aspirin 81 MG EC tablet   Take 1 tablet (81 mg total) by mouth daily.      atorvastatin 40 MG tablet   Commonly known as: LIPITOR   Take 40 mg by mouth at bedtime.      diltiazem 120 MG 24 hr capsule   Commonly known as: CARDIZEM CD   Take 1 capsule (120 mg total) by mouth daily.      metFORMIN 500 MG tablet   Commonly known as: GLUCOPHAGE   Take 500 mg by mouth 2 (two) times daily with a meal.      mulitivitamin with minerals Tabs   Take 1 tablet by mouth daily.      nitroGLYCERIN 0.4  MG SL tablet   Commonly known as: NITROSTAT   Place 1 tablet (0.4 mg total) under the tongue every 5 (five) minutes x 3 doses as needed for chest pain.      potassium chloride SA 20 MEQ tablet   Commonly known as: K-DUR,KLOR-CON   Take 1 tablet (20 mEq total) by mouth daily.      valsartan-hydrochlorothiazide 160-25 MG per tablet   Commonly known as: DIOVAN-HCT   Take 1 tablet by mouth daily.            Outstanding Labs/Studies  BMET on 2/20; Outpt exercise myoview on 2/27 (2 day study)  Duration of Discharge Encounter   Greater than 30 minutes including physician time.  Signed, Nicolasa Ducking NP 03/18/2011, 8:51 AM   I have seen, examined the patient, and reviewed the above assessment and plan.   Co Sign: Hillis Range, MD 03/18/2011 10:08 AM

## 2011-03-18 NOTE — Discharge Instructions (Signed)
***  PLEASE REMEMBER TO BRING ALL OF YOUR MEDICATIONS TO EACH OF YOUR FOLLOW-UP OFFICE VISITS.  

## 2011-03-18 NOTE — ED Provider Notes (Signed)
I saw and evaluated the patient, reviewed the resident's note and I agree with the findings and plan.  Acute onset of palpitations with chest pain and SOB.  SVT to 180s for EMS, broke spontaneously.  Asymptomatic with NSR 90s on arrival. Hx DM, HLD, HTN, mild troponin elevation.  Perhaps from demand ischemia but with risk factors, cardiology will rule out overnight.  Glynn Octave, MD 03/18/11 [phone]

## 2011-03-18 NOTE — Progress Notes (Signed)
SUBJECTIVE: The patient is doing well today.  At this time, she denies chest pain, shortness of breath, or any new concerns.   She has had no further arrhythmias overnight.     Marland Kitchen aspirin  324 mg Oral NOW   Or  . aspirin  300 mg Rectal NOW  . aspirin EC  325 mg Oral Daily  . heparin  5,000 Units Subcutaneous Q8H  . hydrochlorothiazide  25 mg Oral Daily  . mulitivitamin with minerals  1 tablet Oral Daily  . olmesartan  20 mg Oral Daily  . potassium chloride  40 mEq Oral Once  . potassium chloride  40 mEq Oral Once  . potassium chloride SA  40 mEq Oral Daily  . rosuvastatin  20 mg Oral Daily  . sodium chloride  3 mL Intravenous Q12H  . DISCONTD: potassium chloride  40 mEq Oral Once  . DISCONTD: potassium chloride  40 mEq Oral Daily  . DISCONTD: valsartan-hydrochlorothiazide  1 tablet Oral Daily      OBJECTIVE: Physical Exam: Filed Vitals:   03/17/11 2300 03/17/11 2342 03/18/11 0012 03/18/11 0401  BP: 118/76 126/81 126/81 117/65  Pulse: 62 64 64 62  Temp:  98.3 F (36.8 C) 98.3 F (36.8 C) 98 F (36.7 C)  TempSrc:  Oral  Oral  Resp: 15 17 17 18   Height:   5\' 7"  (1.702 m)   Weight:  255 lb 1.2 oz (115.7 kg) 255 lb 1.2 oz (115.7 kg)   SpO2: 100% 99% 99% 97%   No intake or output data in the 24 hours ending 03/18/11 0748  Telemetry reveals sinus rhythm  GEN- The patient is well appearing, alert and oriented x 3 today.   Head- normocephalic, atraumatic Eyes-  Sclera clear, conjunctiva pink Ears- hearing intact Oropharynx- clear Neck- supple, no JVP Lymph- no cervical lymphadenopathy Lungs- Clear to ausculation bilaterally, normal work of breathing Heart- Regular rate and rhythm, no murmurs, rubs or gallops, PMI not laterally displaced GI- soft, NT, ND, + BS Extremities- no clubbing, cyanosis, or edema Skin- no rash or lesion Psych- euthymic mood, full affect Neuro- strength and sensation are intact  LABS: Basic Metabolic Panel:  Basename 03/18/11 0331 03/17/11  2210 03/17/11 1741  NA 139 -- 140  K 3.5 -- 3.0*  CL 104 -- 101  CO2 27 -- 25  GLUCOSE 106* -- 95  BUN 19 -- 21  CREATININE 0.91 0.98 --  CALCIUM 9.6 -- 10.1  MG -- 1.9 --  PHOS -- -- --   Liver Function Tests: No results found for this basename: AST:2,ALT:2,ALKPHOS:2,BILITOT:2,PROT:2,ALBUMIN:2 in the last 72 hours No results found for this basename: LIPASE:2,AMYLASE:2 in the last 72 hours CBC:  Basename 03/17/11 2210 03/17/11 1741  WBC 8.9 8.4  NEUTROABS -- 5.5  HGB 11.9* 13.2  HCT 36.6 39.8  MCV 80.1 80.2  PLT 242 265   Cardiac Enzymes:  Basename 03/18/11 0331 03/17/11 2215 03/17/11 2042  CKTOTAL 277* 295* --  CKMB 2.3 2.3 --  CKMBINDEX -- -- --  TROPONINI 0.36* 0.37* 0.36*    Fasting Lipid Panel:  Basename 03/18/11 0331  CHOL 163  HDL 49  LDLCALC 91  TRIG 114  CHOLHDL 3.3  LDLDIRECT --   Thyroid Function Tests:  Basename 03/17/11 1741  TSH 1.379  T4TOTAL --  T3FREE --  THYROIDAB --   ASSESSMENT AND PLAN:  Active Problems:  * No active hospital problems. *   1. SVT- I have reviewed strips which reveal a narrow complex  short RP tachycardia 180 bpm No further arrhythmias Add cardizem CD 120mg  daily to her home BP regimen at discharge Follow-up with Tereso Newcomer in 3-4 weeks  2. + troponin- likely due to elevated heart rates with SVT No symptoms of ischemia presently EKG this am without ischemic changes Plan gxt myoview in our office later this week  3. Hypokalemia- add KCL daily at discharge Repeat BMET later this week  4. HTN- stable Add cardizem to home regimen as above  DC to home today   Hillis Range, MD 03/18/2011 7:48 AM

## 2011-03-18 NOTE — Progress Notes (Signed)
Discharge education performed. Pt acknowledged understanding. Removed telemetry monitoring and peripheral IV. Catheter intact, no bleeding noted, pt tolerated well. Will wheel pt down to transportation when ride arrives.

## 2011-03-20 ENCOUNTER — Encounter (HOSPITAL_COMMUNITY): Payer: 59 | Admitting: Radiology

## 2011-03-20 ENCOUNTER — Ambulatory Visit (INDEPENDENT_AMBULATORY_CARE_PROVIDER_SITE_OTHER): Payer: 59 | Admitting: *Deleted

## 2011-03-20 DIAGNOSIS — I10 Essential (primary) hypertension: Secondary | ICD-10-CM

## 2011-03-20 LAB — BASIC METABOLIC PANEL
BUN: 16 mg/dL (ref 6–23)
CO2: 28 mEq/L (ref 19–32)
Calcium: 9.6 mg/dL (ref 8.4–10.5)
Chloride: 102 mEq/L (ref 96–112)
Creatinine, Ser: 0.9 mg/dL (ref 0.4–1.2)
GFR: 80.34 mL/min (ref >60.00)
Glucose, Bld: 92 mg/dL (ref 70–99)
Potassium: 4.2 mEq/L (ref 3.5–5.1)
Sodium: 138 mEq/L (ref 135–145)

## 2011-03-26 ENCOUNTER — Other Ambulatory Visit (HOSPITAL_COMMUNITY): Payer: Self-pay | Admitting: Internal Medicine

## 2011-03-26 DIAGNOSIS — R079 Chest pain, unspecified: Secondary | ICD-10-CM

## 2011-03-27 ENCOUNTER — Ambulatory Visit (HOSPITAL_COMMUNITY): Payer: 59 | Attending: Cardiology | Admitting: Radiology

## 2011-03-27 VITALS — BP 105/70 | Ht 67.0 in | Wt 257.0 lb

## 2011-03-27 DIAGNOSIS — R0602 Shortness of breath: Secondary | ICD-10-CM

## 2011-03-27 DIAGNOSIS — E669 Obesity, unspecified: Secondary | ICD-10-CM | POA: Insufficient documentation

## 2011-03-27 DIAGNOSIS — R079 Chest pain, unspecified: Secondary | ICD-10-CM

## 2011-03-27 DIAGNOSIS — R0989 Other specified symptoms and signs involving the circulatory and respiratory systems: Secondary | ICD-10-CM | POA: Insufficient documentation

## 2011-03-27 DIAGNOSIS — R0789 Other chest pain: Secondary | ICD-10-CM | POA: Insufficient documentation

## 2011-03-27 DIAGNOSIS — R002 Palpitations: Secondary | ICD-10-CM | POA: Insufficient documentation

## 2011-03-27 DIAGNOSIS — E119 Type 2 diabetes mellitus without complications: Secondary | ICD-10-CM | POA: Insufficient documentation

## 2011-03-27 DIAGNOSIS — Z8249 Family history of ischemic heart disease and other diseases of the circulatory system: Secondary | ICD-10-CM | POA: Insufficient documentation

## 2011-03-27 DIAGNOSIS — R0609 Other forms of dyspnea: Secondary | ICD-10-CM | POA: Insufficient documentation

## 2011-03-27 DIAGNOSIS — E785 Hyperlipidemia, unspecified: Secondary | ICD-10-CM | POA: Insufficient documentation

## 2011-03-27 DIAGNOSIS — I498 Other specified cardiac arrhythmias: Secondary | ICD-10-CM | POA: Insufficient documentation

## 2011-03-27 DIAGNOSIS — I471 Supraventricular tachycardia: Secondary | ICD-10-CM

## 2011-03-27 DIAGNOSIS — I10 Essential (primary) hypertension: Secondary | ICD-10-CM | POA: Insufficient documentation

## 2011-03-27 MED ORDER — TECHNETIUM TC 99M TETROFOSMIN IV KIT
30.0000 | PACK | Freq: Once | INTRAVENOUS | Status: AC | PRN
  Administered 2011-03-27: 30 via INTRAVENOUS

## 2011-03-27 NOTE — Progress Notes (Signed)
[facility] SITE 3 NUCLEAR MED [address] [place] [address] [phone]  Cardiology Nuclear Med Study  Melody Zhang is a 56 y.o. female [id number] [birth year]   Nuclear Med Background Indication for Stress Test:  Evaluation for Ischemia and 03/17/11 Post Hospital with Chest Pain and SVT, (+) Troponin-likely d/t SVT History:  No previous documented CAD Cardiac Risk Factors: Family History - CAD, Hypertension, Lipids, NIDDM and Obesity  Symptoms:  Chest Tightness with and without Exertion (last episode of chest discomfort: none since discharge), DOE, Palpitations, Rapid HR and SOB w/SVT   Nuclear Pre-Procedure Caffeine/Decaff Intake:  None NPO After: 8:00pm   Lungs:  Clear. IV 0.9% NS with Angio Cath:  22g  IV Site: R Hand  IV Started by:  Stanton Kidney, EMT-P  Chest Size (in):  40 Cup Size: FF  Height: 5\' 7"  (1.702 m)  Weight:  257 lb (116.574 kg)  BMI:  Body mass index is 40.25 kg/(m^2). Tech Comments:  CBG= 125 @ 7:10 am, per patient.    Nuclear Med Study 1 or 2 day study: 2 day  Stress Test Type:  Stress  Reading MD: Olga Millers, MD  Order Authorizing Provider:  Hillis Range, MD; Tereso Newcomer, PA-C  Resting Radionuclide: Technetium 64m Tetrofosmin  Resting Radionuclide Dose: 33.0 mCi on 03/28/11   Stress Radionuclide:  Technetium 28m Tetrofosmin  Stress Radionuclide Dose: 32.5 mCi on 03/27/11           Stress Protocol Rest HR: 62 Stress HR: 146  Rest BP: Sitting:105/70  Standing:96/68 Stress BP: 173/88  Exercise Time (min): 7:01 METS: 7.0   Predicted Max HR: 165 bpm % Max HR: 88.48 bpm Rate Pressure Product: 57846   Dose of Adenosine (mg):  n/a Dose of Lexiscan: n/a mg  Dose of Atropine (mg): n/a Dose of Dobutamine: n/a mcg/kg/min (at max HR)  Stress Test Technologist: Smiley Houseman, CMA-N  Nuclear Technologist:  Doyne Keel, CNMT     Rest Procedure:  Myocardial perfusion imaging was performed at rest 45 minutes following the  intravenous administration of Technetium 32m Tetrofosmin.  Rest ECG: Nonspecific T-wave changes in lead III.  Stress Procedure:  The patient exercised for 7:01 on the treadmill utilizing the Bruce protocol.  The patient stopped due to fatigue and denied any chest pain.  There were no diagnostic ST-T wave changes, occasional PVC's and PAC's were noted. Technetium 19m Tetrofosmin was injected at peak exercise and myocardial perfusion imaging was performed after a brief delay.  Stress ECG: Insignificant upsloping ST segment depression.  QPS Raw Data Images:  There is a breast shadow that accounts for the anterior attenuation. Stress Images:  Small apical perfusion defect.  Rest Images:  Small apical perfusion defect, less pronounced than stress. Subtraction (SDS):  Small, partially reversible apical perfusion defect.  Transient Ischemic Dilatation (Normal <1.22):  1.14 Lung/Heart Ratio (Normal <0.45):  0.34  Quantitative Gated Spect Images QGS EDV:  101 ml QGS ESV:  39 ml QGS cine images:  NL LV Function; NL Wall Motion QGS EF: 62%  Impression Exercise Capacity:  Fair exercise capacity. BP Response:  Normal blood pressure response. Clinical Symptoms:  Short of breath, fatigue.  ECG Impression:  Insignificant upsloping ST segment depression. Comparison with Prior Nuclear Study: No previous nuclear study performed.  Overall Impression:  Low risk stress nuclear study.  Small, partially reversible apical perfusion defect is likely breast attenuation (prominent shadow).  Suspect no ischemia or infarction.  EF is normal.   Marca Ancona

## 2011-03-28 ENCOUNTER — Ambulatory Visit (HOSPITAL_COMMUNITY): Payer: 59 | Attending: Cardiology

## 2011-03-28 DIAGNOSIS — R0989 Other specified symptoms and signs involving the circulatory and respiratory systems: Secondary | ICD-10-CM

## 2011-03-28 MED ORDER — TECHNETIUM TC 99M TETROFOSMIN IV KIT
33.0000 | PACK | Freq: Once | INTRAVENOUS | Status: AC | PRN
  Administered 2011-03-28: 33 via INTRAVENOUS

## 2011-04-12 ENCOUNTER — Ambulatory Visit (INDEPENDENT_AMBULATORY_CARE_PROVIDER_SITE_OTHER): Payer: 59 | Admitting: Physician Assistant

## 2011-04-12 ENCOUNTER — Encounter: Payer: Self-pay | Admitting: Physician Assistant

## 2011-04-12 ENCOUNTER — Telehealth: Payer: Self-pay | Admitting: *Deleted

## 2011-04-12 VITALS — BP 118/80 | HR 67 | Ht 67.0 in | Wt 258.0 lb

## 2011-04-12 DIAGNOSIS — I10 Essential (primary) hypertension: Secondary | ICD-10-CM

## 2011-04-12 DIAGNOSIS — I471 Supraventricular tachycardia, unspecified: Secondary | ICD-10-CM

## 2011-04-12 DIAGNOSIS — E876 Hypokalemia: Secondary | ICD-10-CM

## 2011-04-12 DIAGNOSIS — I498 Other specified cardiac arrhythmias: Secondary | ICD-10-CM

## 2011-04-12 DIAGNOSIS — R000 Tachycardia, unspecified: Secondary | ICD-10-CM

## 2011-04-12 LAB — BASIC METABOLIC PANEL
BUN: 18 mg/dL (ref 6–23)
CO2: 29 mEq/L (ref 19–32)
Calcium: 9.6 mg/dL (ref 8.4–10.5)
Chloride: 102 mEq/L (ref 96–112)
Creatinine, Ser: 0.8 mg/dL (ref 0.4–1.2)
GFR: 90.33 mL/min (ref >60.00)
Glucose, Bld: 97 mg/dL (ref 70–99)
Potassium: 3.7 mEq/L (ref 3.5–5.1)
Sodium: 139 mEq/L (ref 135–145)

## 2011-04-12 NOTE — Progress Notes (Signed)
[address] [place], [place]  [address] Phone: [phone] Fax:  [phone]  Date:  04/12/2011   Name:  Melody Zhang       DOB:  1955/06/28 MRN:  [id number]  PCP:  Dr. Allyne Gee Primary Cardiologist:  Dr. Hillis Range  Primary Electrophysiologist:  Dr. Hillis Range    History of Present Illness: Anecia Nusbaum is a 56 y.o. female who returns for post hospital follow up.  She has a history of hypertension, hyperlipidemia and diabetes.  She was admitted 2/17-2/18 with mildly elevated troponin in the setting of SVT.  She works at [employer].  Telemetry strip demonstrated SVT.  Vagal maneuvers broke the rhythm.  Her CK MBs remained normal.  She was placed on low-dose diltiazem therapy.  Dr. Johney Frame reviewed are strips and noted that she had a narrow complex short RP tachycardia with 180 beats per minute.  He recommended continuing diltiazem.  It was felt that her elevated troponin was likely related to supply demand mismatch due to elevated heart rates of SVT.  OP Myoview was performed 03/26/11: Low risk with small partially reversible apical perfusion defect likely breast attenuation, no ischemia, no scar, EF 62%.  She had a low potassium and was to have follow up blood work:  on 03/20/11 potassium was 4.2 and creatinine 0.9.  Doing well.  She notes occ brief palpitations.  Thinks it may be nerves.  No further episodes like SVT.  Taking all medications.  The patient denies chest pain, shortness of breath, syncope, orthopnea, PND or significant pedal edema.   Past Medical History  Diagnosis Date  . Diabetes mellitus   . Hypertension   . Hyperlipemia   . Paroxysmal SVT (supraventricular tachycardia)     Myoview was performed 03/26/11: Low risk with small partially reversible apical perfusion defect likely breast attenuation, no ischemia, no scar, EF 62%.     Current Outpatient Prescriptions  Medication Sig Dispense Refill  . aspirin 81 MG tablet Take 81  mg by mouth daily.      Marland Kitchen atorvastatin (LIPITOR) 40 MG tablet Take 40 mg by mouth at bedtime.      Marland Kitchen diltiazem (CARDIZEM CD) 120 MG 24 hr capsule Take 1 capsule (120 mg total) by mouth daily.  30 capsule  6  . metFORMIN (GLUCOPHAGE) 500 MG tablet Take 500 mg by mouth 2 (two) times daily with a meal.      . Multiple Vitamin (MULITIVITAMIN WITH MINERALS) TABS Take 1 tablet by mouth daily.      . nitroGLYCERIN (NITROSTAT) 0.4 MG SL tablet Place 1 tablet (0.4 mg total) under the tongue every 5 (five) minutes x 3 doses as needed for chest pain.  25 tablet  3  . potassium chloride SA (K-DUR,KLOR-CON) 20 MEQ tablet Take 1 tablet (20 mEq total) by mouth daily.  30 tablet  6  . valsartan-hydrochlorothiazide (DIOVAN-HCT) 160-25 MG per tablet Take 1 tablet by mouth daily.        Allergies: No Known Allergies  History  Substance Use Topics  . Smoking status: Never Smoker   . Smokeless tobacco: Never Used  . Alcohol Use: No      PHYSICAL EXAM: VS:  BP 118/80  Pulse 67  Ht 5\' 7"  (1.702 m)  Wt 258 lb (117.028 kg)  BMI 40.41 kg/m2 Well nourished, well developed, in no acute distress HEENT: normal Neck: no JVD Cardiac:  normal S1, S2; RRR; no murmur Lungs:  clear to auscultation bilaterally, no wheezing,  rhonchi or rales Abd: soft, nontender, no hepatomegaly Ext: no edema Skin: warm and dry Neuro:  CNs 2-12 intact, no focal abnormalities noted  EKG:  Sinus rhythm, heart rate 67, normal axis, nonspecific ST-T wave changes  ASSESSMENT AND PLAN:  1. SVT (supraventricular tachycardia)  She has occ palps but no significant tachypalps.  Continue current meds.  Follow up with Dr. Hillis Range in 3 mos or sooner as needed.  We reviewed her myoview results.  No further cardiac workup warranted at this time.   2. HYPERTENSION  Controlled.  Continue current therapy.    3. Hypokalemia  Repeat bmet today.    Signed, Tereso Newcomer, PA-C  10:54 AM 04/12/2011

## 2011-04-12 NOTE — Telephone Encounter (Signed)
pt notified of lab results today, gave verbal understanding. Melody Zhang

## 2011-04-12 NOTE — Telephone Encounter (Signed)
Message copied by Tarri Fuller on Fri Apr 12, 2011  3:20 PM ------      Message from: [place], [place] T      Created: Fri Apr 12, 2011  3:04 PM       Please notify patient that the lab results are ok.      Tereso Newcomer, PA-C  3:04 PM 04/12/2011

## 2011-04-12 NOTE — Patient Instructions (Signed)
Your physician recommends that you schedule a follow-up appointment in: DR. Johney Frame IN 3 MONTHS  Your physician recommends that you return for lab work in: TODAY BMET SVT

## 2011-04-17 ENCOUNTER — Other Ambulatory Visit: Payer: 59

## 2011-07-25 ENCOUNTER — Ambulatory Visit: Payer: 59 | Admitting: Internal Medicine

## 2011-09-19 ENCOUNTER — Encounter: Payer: Self-pay | Admitting: Internal Medicine

## 2011-09-19 ENCOUNTER — Ambulatory Visit (INDEPENDENT_AMBULATORY_CARE_PROVIDER_SITE_OTHER): Payer: 59 | Admitting: Internal Medicine

## 2011-09-19 VITALS — BP 124/82 | HR 71 | Resp 18 | Ht 67.0 in | Wt 265.1 lb

## 2011-09-19 DIAGNOSIS — I498 Other specified cardiac arrhythmias: Secondary | ICD-10-CM

## 2011-09-19 DIAGNOSIS — I10 Essential (primary) hypertension: Secondary | ICD-10-CM

## 2011-09-19 DIAGNOSIS — I471 Supraventricular tachycardia: Secondary | ICD-10-CM

## 2011-09-19 MED ORDER — POTASSIUM CHLORIDE CRYS ER 20 MEQ PO TBCR: 20.0000 meq | EXTENDED_RELEASE_TABLET | Freq: Every day | ORAL | Status: DC

## 2011-09-19 MED ORDER — DILTIAZEM HCL ER COATED BEADS 120 MG PO CP24: 120.0000 mg | ORAL_CAPSULE | Freq: Every day | ORAL | Status: DC

## 2011-09-19 NOTE — Assessment & Plan Note (Signed)
Stable No change required today  

## 2011-09-19 NOTE — Progress Notes (Signed)
PCP: Gwynneth Aliment, MD  Melody Zhang is a 56 y.o. female who presents today for routine electrophysiology followup.  Since last being seen in our clinic, the patient reports doing very well.  She denies any episodes of sustained arrhythmia.  She has very rare palpitations, lasting only 1-2 seconds. Today, she denies symptoms of chest pain, shortness of breath,  lower extremity edema, dizziness, presyncope, or syncope.  The patient is otherwise without complaint today.   Past Medical History  Diagnosis Date  . Diabetes mellitus   . Hypertension   . Hyperlipemia   . Paroxysmal SVT (supraventricular tachycardia)     Myoview was performed 03/26/11: Low risk with small partially reversible apical perfusion defect likely breast attenuation, no ischemia, no scar, EF 62%.    Past Surgical History  Procedure Date  . Abdominal hysterectomy     Current Outpatient Prescriptions  Medication Sig Dispense Refill  . aspirin 81 MG tablet Take 81 mg by mouth daily.      Marland Kitchen atorvastatin (LIPITOR) 40 MG tablet Take 40 mg by mouth at bedtime.      Marland Kitchen diltiazem (CARDIZEM CD) 120 MG 24 hr capsule Take 1 capsule (120 mg total) by mouth daily.  30 capsule  6  . metFORMIN (GLUCOPHAGE) 500 MG tablet Take 500 mg by mouth 2 (two) times daily with a meal.      . Multiple Vitamin (MULITIVITAMIN WITH MINERALS) TABS Take 1 tablet by mouth daily.      . nitroGLYCERIN (NITROSTAT) 0.4 MG SL tablet Place 1 tablet (0.4 mg total) under the tongue every 5 (five) minutes x 3 doses as needed for chest pain.  25 tablet  3  . potassium chloride SA (K-DUR,KLOR-CON) 20 MEQ tablet Take 1 tablet (20 mEq total) by mouth daily.  30 tablet  6  . valsartan-hydrochlorothiazide (DIOVAN-HCT) 160-25 MG per tablet Take 1 tablet by mouth daily.        Physical Exam: Filed Vitals:   09/19/11 1033  BP: 124/82  Pulse: 71  Resp: 18  Height: 5\' 7"  (1.702 m)  Weight: 265 lb 1.9 oz (120.258 kg)  SpO2: 91%    GEN- The patient is well  appearing, alert and oriented x 3 today.   Head- normocephalic, atraumatic Eyes-  Sclera clear, conjunctiva pink Ears- hearing intact Oropharynx- clear Lungs- Clear to ausculation bilaterally, normal work of breathing Heart- Regular rate and rhythm, no murmurs, rubs or gallops, PMI not laterally displaced GI- soft, NT, ND, + BS Extremities- no clubbing, cyanosis, or edema  ekg today reveals sinus rhythm 66 bpm,PR 168, QRS 82, Qtc 423, normal ekg  Assessment and Plan:

## 2011-09-19 NOTE — Patient Instructions (Addendum)
Your physician wants you to follow-up in: 12 months with Dr Allred You will receive a reminder letter in the mail two months in advance. If you don't receive a letter, please call our office to schedule the follow-up appointment.  

## 2011-09-19 NOTE — Assessment & Plan Note (Signed)
Presently controlled with diltiazem Today, we discussed ablation.  She is very clear that she wishes to avoid catheter ablation at this time. She will try vagal maneuvers as instructed today if her tachycardia returns

## 2012-12-03 ENCOUNTER — Other Ambulatory Visit: Payer: Self-pay

## 2012-12-03 DIAGNOSIS — Z1231 Encounter for screening mammogram for malignant neoplasm of breast: Secondary | ICD-10-CM

## 2013-01-14 ENCOUNTER — Ambulatory Visit
Admission: RE | Admit: 2013-01-14 | Discharge: 2013-01-14 | Disposition: A | Discharge status: Home / Self Care | Payer: 59 | Source: Ambulatory Visit

## 2013-01-14 DIAGNOSIS — Z1231 Encounter for screening mammogram for malignant neoplasm of breast: Secondary | ICD-10-CM

## 2013-02-02 ENCOUNTER — Other Ambulatory Visit: Payer: Self-pay | Admitting: Internal Medicine

## 2013-03-05 ENCOUNTER — Other Ambulatory Visit: Payer: Self-pay | Admitting: Internal Medicine

## 2013-05-09 ENCOUNTER — Other Ambulatory Visit: Payer: Self-pay | Admitting: Internal Medicine

## 2013-06-19 ENCOUNTER — Other Ambulatory Visit: Payer: Self-pay | Admitting: Internal Medicine

## 2015-06-14 NOTE — Progress Notes (Signed)
Electrophysiology Office Note   Date:  06/15/2015   ID:  Melody, Zhang 02-20-1955, MRN [id number]  PCP:  Maximino Greenland, MD Primary Electrophysiologist:  Will Meredith Leeds, MD    Chief Complaint  Patient presents with  . New Patient (Initial Visit)  . SVT     History of Present Illness: Melody Zhang is a 60 y.o. female who presents today for electrophysiology evaluation.   She was initially followed in 2013 by Dr. Rayann Heman for palpitations lasting 1-2 seconds at a time.  At that time, her SVT was controlled with diltiazem.  She wished to avoid catheter ablation at that time.She returns today complaining of episodic chest pain and shortness of breath with exertion and shortness of breath when lying on her left side. She says the chest pain occurs at random times and sometimes with exertion sometimes not. She says that she can take deep breaths and that improved his chest pain. 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 On her walk from the building to her car, she does get short of breath with exertion and has to take a break to rest. She says that this is a new problem for her. She has not noticed palpitations, but does say that she has an occasional skipped beat.   Today, she denies symptoms of palpitations,PND, lower extremity edema, claudication, dizziness, presyncope, syncope, bleeding, or neurologic sequela. The patient is tolerating medications without difficulties and is otherwise without complaint today.    Past Medical History  Diagnosis Date  . Diabetes mellitus   . Hypertension   . Hyperlipemia   . Paroxysmal SVT (supraventricular tachycardia) (HCC)     Myoview was performed 03/26/11: Low risk with small partially reversible apical perfusion defect likely breast attenuation, no ischemia, no scar, EF 62%.    Past Surgical History  Procedure Laterality Date  . Abdominal hysterectomy       Current Outpatient  Prescriptions  Medication Sig Dispense Refill  . aspirin 81 MG tablet Take 81 mg by mouth daily.    Marland Kitchen atorvastatin (LIPITOR) 40 MG tablet Take 40 mg by mouth at bedtime.    Marland Kitchen diltiazem (CARDIZEM CD) 120 MG 24 hr capsule TAKE ONE CAPSULE BY MOUTH ONCE DAILY 15 capsule 0  . KLOR-CON M20 20 MEQ tablet TAKE ONE TABLET BY MOUTH EVERY DAY 15 tablet 0  . metFORMIN (GLUCOPHAGE) 500 MG tablet Take 500 mg by mouth 2 (two) times daily with a meal.    . Multiple Vitamin (MULITIVITAMIN WITH MINERALS) TABS Take 1 tablet by mouth daily.    . nitroGLYCERIN (NITROSTAT) 0.4 MG SL tablet Place 1 tablet (0.4 mg total) under the tongue every 5 (five) minutes x 3 doses as needed for chest pain. 25 tablet 3  . valsartan-hydrochlorothiazide (DIOVAN-HCT) 160-25 MG per tablet Take 1 tablet by mouth daily.     No current facility-administered medications for this visit.    Allergies:   Review of patient's allergies indicates no known allergies.   Social History:  The patient  reports that she has never smoked. She has never used smokeless tobacco. She reports that she does not drink alcohol or use illicit drugs.   Family History:  The patient's family history includes Healthy in her mother; Heart attack in her father.    ROS:  Please see the history of present illness.   Otherwise, review of systems is positive for chest pain, palpitations, DOE, orthopnea.   All other  systems are reviewed and negative.    PHYSICAL EXAM: VS:  BP 142/96 mmHg  Pulse 75  Ht 5\' 7"  (1.702 m)  Wt 277 lb 3.2 oz (125.737 kg)  BMI 43.41 kg/m2 , BMI Body mass index is 43.41 kg/(m^2). GEN: Well nourished, well developed, in no acute distress HEENT: normal Neck: no JVD, carotid bruits, or masses Cardiac: RRR; no murmurs, rubs, or gallops,no edema  Respiratory:  clear to auscultation bilaterally, normal work of breathing GI: soft, nontender, nondistended, + BS MS: no deformity or atrophy Skin: warm and dry Neuro:  Strength and  sensation are intact Psych: euthymic mood, full affect  EKG:  EKG is ordered today. The ekg ordered today shows sinus rhythm, rate 75  Recent Labs: No results found for requested labs within last 365 days.    Lipid Panel     Component Value Date/Time   CHOL 163 03/18/2011 0331   TRIG 114 03/18/2011 0331   HDL 49 03/18/2011 0331   CHOLHDL 3.3 03/18/2011 0331   VLDL 23 03/18/2011 0331   LDLCALC 91 03/18/2011 0331     Wt Readings from Last 3 Encounters:  06/15/15 277 lb 3.2 oz (125.737 kg)  09/19/11 265 lb 1.9 oz (120.258 kg)  04/12/11 258 lb (117.028 kg)      Other studies Reviewed: Additional studies/ records that were reviewed today include: Epic records  ASSESSMENT AND PLAN:  1.  Chest pain: At this time, her chest pain appears to be somewhat atypical. That being said she does have shortness of breath exertion, and possible history of orthopnea. Due to that, we'll get a stress echocardiogram to determine her EF valvular function and see if she has any evidence of ischemia.   2. Palpitations: Currently her palpitations are much improved on the diltiazem. She does say that she has some skipped beats, but these are likely due to PVCs or APCs. No further management as needed.    Current medicines are reviewed at length with the patient today.   The patient does not have concerns regarding her medicines.  The following changes were made today:  none  Labs/ tests ordered today include:  Orders Placed This Encounter  Procedures  . EKG 12-Lead  . ECHO STRESS TEST     Disposition:   FU with Will Camnitz pending stress testing  Signed, Will Meredith Leeds, MD  06/15/2015 9:50 AM     [facility] [address] [place] [address] [phone] (office) [phone] (fax)

## 2015-06-15 ENCOUNTER — Ambulatory Visit (INDEPENDENT_AMBULATORY_CARE_PROVIDER_SITE_OTHER): Payer: BLUE CROSS/BLUE SHIELD | Admitting: Cardiology

## 2015-06-15 ENCOUNTER — Encounter: Payer: Self-pay | Admitting: Cardiology

## 2015-06-15 VITALS — BP 142/96 | HR 75 | Ht 67.0 in | Wt 277.2 lb

## 2015-06-15 DIAGNOSIS — I471 Supraventricular tachycardia: Secondary | ICD-10-CM

## 2015-06-15 DIAGNOSIS — R079 Chest pain, unspecified: Secondary | ICD-10-CM | POA: Diagnosis not present

## 2015-06-15 NOTE — Patient Instructions (Signed)
Medication Instructions:  Your physician recommends that you continue on your current medications as directed. Please refer to the Current Medication list given to you today.  Labwork: None ordered  Testing/Procedures: Your physician has requested that you have a stress echocardiogram. For further information please visit [email or web address]. Please follow instruction sheet as given.  Follow-Up: To be determined pending test results. We will  Call you with the results of this test once it is reviewed by the physician.  Thank you for choosing [facility]!!   Trinidad Curet, RN [phone]

## 2015-07-07 ENCOUNTER — Telehealth (HOSPITAL_COMMUNITY): Payer: Self-pay | Admitting: *Deleted

## 2015-07-07 NOTE — Telephone Encounter (Signed)
Patient given detailed instructions per Stress Test Requisition Sheet for test on 07/10/15 at 7:30.Patient Notified to arrive 30 minutes early, and that it is imperative to arrive on time for appointment to keep from having the test rescheduled.  Patient verbalized understanding. Melody Zhang

## 2015-07-10 ENCOUNTER — Ambulatory Visit (HOSPITAL_COMMUNITY): Payer: BLUE CROSS/BLUE SHIELD | Attending: Cardiovascular Disease

## 2015-07-10 ENCOUNTER — Ambulatory Visit (HOSPITAL_COMMUNITY): Payer: BLUE CROSS/BLUE SHIELD

## 2015-07-10 ENCOUNTER — Encounter (HOSPITAL_COMMUNITY): Payer: Self-pay

## 2015-07-10 NOTE — Progress Notes (Signed)
Melody Zhang presented for an exercise stress echo this morning. Unfortunately due to leg fatigue, she couldn't get to target so we switched to Dobutamine stress echo. She got anxious and body started shaking badly 5 minutes in so we aborted the exam. Talked to DOD (Dr. Radford Pax) and she advised me to talk to Dr. Curt Bears when he comes in a little later. Patient was discharged after she calmed down.   [name], [place] 07/10/2015

## 2015-07-10 NOTE — Progress Notes (Signed)
Talked with Dr. Curt Bears about the aborted stress echo. He inquired about heart function at baseline. I responded that her LV had a good squeeze. He acknowledged and said he will take care of the rest.  [name], [facility] 07/10/2015

## 2015-07-18 ENCOUNTER — Telehealth: Payer: Self-pay | Admitting: *Deleted

## 2015-07-18 DIAGNOSIS — R079 Chest pain, unspecified: Secondary | ICD-10-CM

## 2015-07-18 NOTE — Telephone Encounter (Signed)
Will Meredith Leeds, MD [phone])   Sent: Tue July 11, 2015 4:45 PM    To: Stanton Kidney, RN        Message     Can we get a TTE and a lexi myoview. Thanks.    ----- Message -----     From: Etter Sjogren, RDCS     Sent: 07/10/2015  1:31 PM      To: Will Meredith Leeds, MD                   Melody Zhang  07/10/2015  Documentation  MRN:  [id number]   Description: Female DOB: Sep 27, 1955  Provider: Etter Sjogren, RDCS  Department: [facility]          Reason for Visit     Reason for Visit History         Progress Notes      Etter Sjogren, RDCS at 07/10/2015 8:18 AM     Status: Signed       Expand All Collapse All   [name] presented for an exercise stress echo this morning. Unfortunately due to leg fatigue, she couldn't get to target so we switched to Dobutamine stress echo. She got anxious and body started shaking badly 5 minutes in so we aborted the exam. Talked to DOD (Dr. Radford Pax) and she advised me to talk to Dr. Curt Bears when he comes in a little later. Patient was discharged after she calmed down.   [name], [place] 07/10/2015

## 2015-07-18 NOTE — Telephone Encounter (Signed)
lmtcb   (to inform patient of below recommendations from Dr. Curt Bears)

## 2015-07-28 ENCOUNTER — Encounter: Payer: Self-pay | Admitting: Cardiology

## 2015-07-28 NOTE — Telephone Encounter (Signed)
Advised patient that Dr. Curt Bears would like to have patient do different testing. Informed her of orders for echo and lexi testing. Patient is agreeable and understands office will call her to arrange these tests.

## 2015-07-28 NOTE — Telephone Encounter (Signed)
Christene Lye at 07/28/2015 2:22 PM     Status: Signed       Expand All Collapse All   Follow-up     The pt is returning the nurses call

## 2015-07-28 NOTE — Telephone Encounter (Signed)
This encounter was created in error - please disregard.

## 2015-07-28 NOTE — Telephone Encounter (Signed)
lmtcb

## 2015-07-28 NOTE — Telephone Encounter (Signed)
Follow-up ° ° ° ° °The pt is returning the nurses call °

## 2015-08-16 ENCOUNTER — Telehealth (HOSPITAL_COMMUNITY): Payer: Self-pay | Admitting: *Deleted

## 2015-08-16 NOTE — Telephone Encounter (Signed)
Left message on voicemail per DPR in reference to upcoming appointment scheduled on 08/21/15 with detailed instructions given per Myocardial Perfusion Study Information Sheet for the test. LM to arrive 15 minutes early, and that it is imperative to arrive on time for appointment to keep from having the test rescheduled. If you need to cancel or reschedule your appointment, please call the office within 24 hours of your appointment. Failure to do so may result in a cancellation of your appointment, and a $50 no show fee. Phone number given for call back for any questions. Hubbard Robinson, RN

## 2015-08-21 ENCOUNTER — Other Ambulatory Visit: Payer: Self-pay

## 2015-08-21 ENCOUNTER — Ambulatory Visit (HOSPITAL_COMMUNITY): Payer: BLUE CROSS/BLUE SHIELD | Attending: Internal Medicine

## 2015-08-21 ENCOUNTER — Ambulatory Visit (HOSPITAL_BASED_OUTPATIENT_CLINIC_OR_DEPARTMENT_OTHER): Payer: BLUE CROSS/BLUE SHIELD

## 2015-08-21 DIAGNOSIS — R0609 Other forms of dyspnea: Secondary | ICD-10-CM | POA: Insufficient documentation

## 2015-08-21 DIAGNOSIS — R0602 Shortness of breath: Secondary | ICD-10-CM | POA: Diagnosis not present

## 2015-08-21 DIAGNOSIS — Z8249 Family history of ischemic heart disease and other diseases of the circulatory system: Secondary | ICD-10-CM | POA: Diagnosis not present

## 2015-08-21 DIAGNOSIS — E785 Hyperlipidemia, unspecified: Secondary | ICD-10-CM | POA: Insufficient documentation

## 2015-08-21 DIAGNOSIS — E119 Type 2 diabetes mellitus without complications: Secondary | ICD-10-CM | POA: Diagnosis not present

## 2015-08-21 DIAGNOSIS — R079 Chest pain, unspecified: Secondary | ICD-10-CM | POA: Insufficient documentation

## 2015-08-21 DIAGNOSIS — Z6841 Body Mass Index (BMI) 40.0 and over, adult: Secondary | ICD-10-CM | POA: Diagnosis not present

## 2015-08-21 DIAGNOSIS — R002 Palpitations: Secondary | ICD-10-CM | POA: Diagnosis not present

## 2015-08-21 DIAGNOSIS — I119 Hypertensive heart disease without heart failure: Secondary | ICD-10-CM | POA: Diagnosis not present

## 2015-08-21 DIAGNOSIS — I340 Nonrheumatic mitral (valve) insufficiency: Secondary | ICD-10-CM | POA: Insufficient documentation

## 2015-08-21 MED ORDER — TECHNETIUM TC 99M TETROFOSMIN IV KIT
33.0000 | PACK | Freq: Once | INTRAVENOUS | Status: AC | PRN
  Administered 2015-08-21: 33 via INTRAVENOUS
  Filled 2015-08-21: qty 33

## 2015-08-21 MED ORDER — REGADENOSON 0.4 MG/5ML IV SOLN
0.4000 mg | Freq: Once | INTRAVENOUS | Status: AC
  Administered 2015-08-21: 0.4 mg via INTRAVENOUS

## 2015-08-22 ENCOUNTER — Ambulatory Visit (HOSPITAL_COMMUNITY): Payer: BLUE CROSS/BLUE SHIELD | Attending: Cardiology

## 2015-08-22 LAB — MYOCARDIAL PERFUSION IMAGING
LV dias vol: 107 mL (ref 46–106)
LV sys vol: 46 mL
Peak HR: 96 {beats}/min
RATE: 0.25
Rest HR: 69 {beats}/min
SDS: 3
SRS: 2
SSS: 5
TID: 0.94

## 2015-08-22 MED ORDER — TECHNETIUM TC 99M TETROFOSMIN IV KIT
33.0000 | PACK | Freq: Once | INTRAVENOUS | Status: AC | PRN
  Administered 2015-08-22: 33 via INTRAVENOUS
  Filled 2015-08-22: qty 33

## 2015-09-28 ENCOUNTER — Telehealth: Payer: Self-pay | Admitting: Cardiology

## 2015-09-28 NOTE — Telephone Encounter (Signed)
Follow Up:    Returning Sherry's call.

## 2015-09-28 NOTE — Telephone Encounter (Signed)
Pt states that Trinidad Curet RN called her yesterday. I was not able to fine that a phone call was made yesterday. Pt is aware that this message will be send to [facility] for reviewing.

## 2015-09-28 NOTE — Telephone Encounter (Signed)
lmtcb  Lm asking patient to inform us if she would like to follow up with Dr. Curt Bears in one yr or PRN since testing was negative

## 2015-10-31 ENCOUNTER — Encounter: Payer: Self-pay | Admitting: Cardiology

## 2015-10-31 NOTE — Telephone Encounter (Signed)
See 8/31 telephone encounter for documentation   This encounter was created in error - please disregard.

## 2015-10-31 NOTE — Telephone Encounter (Signed)
Glyn Ade at 10/31/2015 12:59 PM   Status: Signed    Follow Up:    Pt called and said she would like follow up with Dr Curt Bears once a year.      I will place recall in system.

## 2015-10-31 NOTE — Telephone Encounter (Signed)
Follow Up:    Pt called and said she would like follow up with Dr Curt Bears once a year.

## 2016-07-09 DIAGNOSIS — Z6841 Body Mass Index (BMI) 40.0 and over, adult: Secondary | ICD-10-CM | POA: Diagnosis not present

## 2016-07-09 DIAGNOSIS — Z1211 Encounter for screening for malignant neoplasm of colon: Secondary | ICD-10-CM | POA: Diagnosis not present

## 2016-07-09 DIAGNOSIS — E119 Type 2 diabetes mellitus without complications: Secondary | ICD-10-CM | POA: Diagnosis not present

## 2016-07-09 DIAGNOSIS — I471 Supraventricular tachycardia: Secondary | ICD-10-CM | POA: Diagnosis not present

## 2016-07-09 DIAGNOSIS — I10 Essential (primary) hypertension: Secondary | ICD-10-CM | POA: Diagnosis not present

## 2016-07-09 DIAGNOSIS — E7800 Pure hypercholesterolemia, unspecified: Secondary | ICD-10-CM | POA: Diagnosis not present

## 2016-08-26 MED FILL — METFORMIN HCL ER 750 MG TAB: 750 | 90 days supply | Qty: 90 | Fill #0

## 2016-08-26 MED FILL — POTASSIUM CL ER 20 MEQ TABL: 20 | 30 days supply | Qty: 30 | Fill #0

## 2016-08-26 MED FILL — LOSARTAN-HCTZ 100-25 MG TAB: 100-25 | 90 days supply | Qty: 90 | Fill #0

## 2016-08-26 MED FILL — dilTIAZem HCL 120 MG TABS: 120 | 90 days supply | Qty: 90 | Fill #0

## 2016-09-03 MED FILL — ROSUVASTATIN CALCIUM 20 MG: 20 | 90 days supply | Qty: 90 | Fill #0

## 2016-10-16 MED FILL — POTASSIUM CL ER 20 MEQ TABL: 20 | 60 days supply | Qty: 60 | Fill #1

## 2016-11-12 DIAGNOSIS — Z23 Encounter for immunization: Secondary | ICD-10-CM | POA: Diagnosis not present

## 2016-12-16 MED FILL — ROSUVASTATIN CALCIUM 20 MG: 20 | 60 days supply | Qty: 60 | Fill #1

## 2016-12-16 MED FILL — dilTIAZem HCL 120 MG TABS: 120 | 60 days supply | Qty: 60 | Fill #1

## 2016-12-16 MED FILL — LOSARTAN-HCTZ 100-25 MG TAB: 100-25 | 60 days supply | Qty: 60 | Fill #1

## 2016-12-16 MED FILL — POTASSIUM CL ER 20 MEQ TABL: 20 | 60 days supply | Qty: 60 | Fill #2

## 2016-12-16 MED FILL — METFORMIN HCL ER 750 MG TAB: 750 | 60 days supply | Qty: 60 | Fill #1

## 2017-01-14 DIAGNOSIS — Z23 Encounter for immunization: Secondary | ICD-10-CM | POA: Diagnosis not present

## 2017-01-14 DIAGNOSIS — I10 Essential (primary) hypertension: Secondary | ICD-10-CM | POA: Diagnosis not present

## 2017-01-14 DIAGNOSIS — Z1211 Encounter for screening for malignant neoplasm of colon: Secondary | ICD-10-CM | POA: Diagnosis not present

## 2017-01-14 DIAGNOSIS — Z6841 Body Mass Index (BMI) 40.0 and over, adult: Secondary | ICD-10-CM | POA: Diagnosis not present

## 2017-01-14 DIAGNOSIS — E119 Type 2 diabetes mellitus without complications: Secondary | ICD-10-CM | POA: Diagnosis not present

## 2017-01-14 DIAGNOSIS — E7800 Pure hypercholesterolemia, unspecified: Secondary | ICD-10-CM | POA: Diagnosis not present

## 2017-01-14 DIAGNOSIS — I471 Supraventricular tachycardia: Secondary | ICD-10-CM | POA: Diagnosis not present

## 2017-01-14 DIAGNOSIS — Z0000 Encounter for general adult medical examination without abnormal findings: Secondary | ICD-10-CM | POA: Diagnosis not present

## 2017-01-14 DIAGNOSIS — G4733 Obstructive sleep apnea (adult) (pediatric): Secondary | ICD-10-CM | POA: Diagnosis not present

## 2017-01-14 DIAGNOSIS — Z1159 Encounter for screening for other viral diseases: Secondary | ICD-10-CM | POA: Diagnosis not present

## 2017-01-16 ENCOUNTER — Other Ambulatory Visit: Payer: Self-pay | Admitting: Family Medicine

## 2017-01-16 DIAGNOSIS — Z1231 Encounter for screening mammogram for malignant neoplasm of breast: Secondary | ICD-10-CM

## 2017-01-28 DIAGNOSIS — C50919 Malignant neoplasm of unspecified site of unspecified female breast: Secondary | ICD-10-CM

## 2017-01-28 DIAGNOSIS — Z923 Personal history of irradiation: Secondary | ICD-10-CM

## 2017-01-28 HISTORY — DX: Personal history of irradiation: Z92.3

## 2017-01-28 HISTORY — DX: Malignant neoplasm of unspecified site of unspecified female breast: C50.919

## 2017-02-13 ENCOUNTER — Ambulatory Visit
Admission: RE | Admit: 2017-02-13 | Discharge: 2017-02-13 | Disposition: A | Discharge status: Home / Self Care | Payer: BLUE CROSS/BLUE SHIELD | Source: Ambulatory Visit | Attending: Family Medicine | Admitting: Family Medicine

## 2017-02-13 DIAGNOSIS — Z1231 Encounter for screening mammogram for malignant neoplasm of breast: Secondary | ICD-10-CM | POA: Diagnosis not present

## 2017-02-14 ENCOUNTER — Other Ambulatory Visit: Payer: Self-pay | Admitting: Family Medicine

## 2017-02-14 DIAGNOSIS — R928 Other abnormal and inconclusive findings on diagnostic imaging of breast: Secondary | ICD-10-CM

## 2017-02-17 ENCOUNTER — Ambulatory Visit
Admission: RE | Admit: 2017-02-17 | Discharge: 2017-02-17 | Disposition: A | Discharge status: Home / Self Care | Payer: 59 | Source: Ambulatory Visit | Attending: Family Medicine | Admitting: Family Medicine

## 2017-02-17 ENCOUNTER — Other Ambulatory Visit: Payer: Self-pay | Admitting: Family Medicine

## 2017-02-17 DIAGNOSIS — R928 Other abnormal and inconclusive findings on diagnostic imaging of breast: Secondary | ICD-10-CM | POA: Diagnosis not present

## 2017-02-17 DIAGNOSIS — N6321 Unspecified lump in the left breast, upper outer quadrant: Secondary | ICD-10-CM | POA: Diagnosis not present

## 2017-02-17 DIAGNOSIS — N6320 Unspecified lump in the left breast, unspecified quadrant: Secondary | ICD-10-CM

## 2017-02-19 ENCOUNTER — Other Ambulatory Visit: Payer: 59

## 2017-02-20 ENCOUNTER — Other Ambulatory Visit: Payer: Self-pay | Admitting: Family Medicine

## 2017-02-20 ENCOUNTER — Other Ambulatory Visit: Payer: 59

## 2017-02-24 ENCOUNTER — Ambulatory Visit
Admission: RE | Admit: 2017-02-24 | Discharge: 2017-02-24 | Disposition: A | Discharge status: Home / Self Care | Payer: 59 | Source: Ambulatory Visit | Attending: Family Medicine | Admitting: Family Medicine

## 2017-02-24 ENCOUNTER — Other Ambulatory Visit: Payer: Self-pay | Admitting: Family Medicine

## 2017-02-24 DIAGNOSIS — N6320 Unspecified lump in the left breast, unspecified quadrant: Secondary | ICD-10-CM

## 2017-02-24 DIAGNOSIS — C50212 Malignant neoplasm of upper-inner quadrant of left female breast: Secondary | ICD-10-CM | POA: Diagnosis not present

## 2017-02-24 DIAGNOSIS — N6322 Unspecified lump in the left breast, upper inner quadrant: Secondary | ICD-10-CM | POA: Diagnosis not present

## 2017-02-24 HISTORY — PX: BREAST BIOPSY: SHX20

## 2017-02-25 DIAGNOSIS — D509 Iron deficiency anemia, unspecified: Secondary | ICD-10-CM | POA: Diagnosis not present

## 2017-02-25 DIAGNOSIS — Z1211 Encounter for screening for malignant neoplasm of colon: Secondary | ICD-10-CM | POA: Diagnosis not present

## 2017-02-25 DIAGNOSIS — Z86010 Personal history of colonic polyps: Secondary | ICD-10-CM | POA: Diagnosis not present

## 2017-02-25 DIAGNOSIS — K5904 Chronic idiopathic constipation: Secondary | ICD-10-CM | POA: Diagnosis not present

## 2017-02-26 ENCOUNTER — Telehealth: Payer: Self-pay | Admitting: Hematology and Oncology

## 2017-02-26 MED FILL — LOSARTAN-HCTZ 100-25 MG TAB: 100-25 | 90 days supply | Qty: 90 | Fill #0

## 2017-02-26 MED FILL — POTASSIUM CL ER 20 MEQ TABL: 20 | 90 days supply | Qty: 90 | Fill #0

## 2017-02-26 NOTE — Telephone Encounter (Signed)
Spoke with patient to confirm afternoon appointment for [facility] on 03/05/17, emailed packet to patient

## 2017-02-27 ENCOUNTER — Encounter: Payer: Self-pay | Admitting: *Deleted

## 2017-03-03 ENCOUNTER — Encounter: Payer: Self-pay | Admitting: General Surgery

## 2017-03-03 DIAGNOSIS — G4733 Obstructive sleep apnea (adult) (pediatric): Secondary | ICD-10-CM | POA: Diagnosis not present

## 2017-03-04 ENCOUNTER — Other Ambulatory Visit: Payer: Self-pay | Admitting: *Deleted

## 2017-03-04 DIAGNOSIS — C50212 Malignant neoplasm of upper-inner quadrant of left female breast: Secondary | ICD-10-CM | POA: Insufficient documentation

## 2017-03-04 DIAGNOSIS — Z170 Estrogen receptor positive status [ER+]: Secondary | ICD-10-CM | POA: Insufficient documentation

## 2017-03-05 ENCOUNTER — Ambulatory Visit: Payer: 59 | Attending: General Surgery | Admitting: Physical Therapy

## 2017-03-05 ENCOUNTER — Inpatient Hospital Stay: Payer: 59 | Attending: Hematology and Oncology | Admitting: Hematology and Oncology

## 2017-03-05 ENCOUNTER — Ambulatory Visit
Admission: RE | Admit: 2017-03-05 | Discharge: 2017-03-05 | Disposition: A | Discharge status: Home / Self Care | Payer: 59 | Source: Ambulatory Visit | Attending: Radiation Oncology | Admitting: Radiation Oncology

## 2017-03-05 ENCOUNTER — Other Ambulatory Visit: Payer: Self-pay | Admitting: General Surgery

## 2017-03-05 ENCOUNTER — Other Ambulatory Visit: Payer: Self-pay

## 2017-03-05 ENCOUNTER — Encounter: Payer: Self-pay | Admitting: Hematology and Oncology

## 2017-03-05 ENCOUNTER — Inpatient Hospital Stay: Payer: 59

## 2017-03-05 ENCOUNTER — Encounter: Payer: Self-pay | Admitting: Physical Therapy

## 2017-03-05 DIAGNOSIS — Z170 Estrogen receptor positive status [ER+]: Secondary | ICD-10-CM | POA: Insufficient documentation

## 2017-03-05 DIAGNOSIS — E119 Type 2 diabetes mellitus without complications: Secondary | ICD-10-CM | POA: Diagnosis not present

## 2017-03-05 DIAGNOSIS — C50212 Malignant neoplasm of upper-inner quadrant of left female breast: Secondary | ICD-10-CM

## 2017-03-05 DIAGNOSIS — I10 Essential (primary) hypertension: Secondary | ICD-10-CM | POA: Diagnosis not present

## 2017-03-05 DIAGNOSIS — Z803 Family history of malignant neoplasm of breast: Secondary | ICD-10-CM | POA: Diagnosis not present

## 2017-03-05 DIAGNOSIS — Z90710 Acquired absence of both cervix and uterus: Secondary | ICD-10-CM | POA: Diagnosis not present

## 2017-03-05 DIAGNOSIS — R293 Abnormal posture: Secondary | ICD-10-CM | POA: Diagnosis present

## 2017-03-05 LAB — CMP (CANCER CENTER ONLY)
ALT: 13 U/L (ref 0–55)
AST: 14 U/L (ref 5–34)
Albumin: 3.7 g/dL (ref 3.5–5.0)
Alkaline Phosphatase: 73 U/L (ref 40–150)
Anion gap: 12 — ABNORMAL HIGH (ref 3–11)
BUN: 22 mg/dL (ref 7–26)
CO2: 26 mmol/L (ref 22–29)
Calcium: 9.9 mg/dL (ref 8.4–10.4)
Chloride: 102 mmol/L (ref 98–109)
Creatinine: 0.92 mg/dL (ref 0.60–1.10)
GFR, Est AFR Am: >60 mL/min (ref >60)
GFR, Estimated: >60 mL/min (ref >60)
Glucose, Bld: 102 mg/dL (ref 70–140)
Potassium: 3.5 mmol/L (ref 3.5–5.1)
Sodium: 140 mmol/L (ref 136–145)
Total Bilirubin: 0.3 mg/dL (ref 0.2–1.2)
Total Protein: 7.4 g/dL (ref 6.4–8.3)

## 2017-03-05 LAB — CBC WITH DIFFERENTIAL (CANCER CENTER ONLY)
Basophils Absolute: 0.1 10*3/uL (ref 0.0–0.1)
Basophils Relative: 1 %
Eosinophils Absolute: 0.2 10*3/uL (ref 0.0–0.5)
Eosinophils Relative: 3 %
HCT: 37.7 % (ref 34.8–46.6)
Hemoglobin: 12.1 g/dL (ref 11.6–15.9)
Lymphocytes Relative: 18 %
Lymphs Abs: 1.7 10*3/uL (ref 0.9–3.3)
MCH: 25.4 pg (ref 25.1–34.0)
MCHC: 31.9 g/dL (ref 31.5–36.0)
MCV: 79.4 fL — ABNORMAL LOW (ref 79.5–101.0)
Monocytes Absolute: 0.5 10*3/uL (ref 0.1–0.9)
Monocytes Relative: 5 %
Neutro Abs: 7 10*3/uL — ABNORMAL HIGH (ref 1.5–6.5)
Neutrophils Relative %: 73 %
Platelet Count: 252 10*3/uL (ref 145–400)
RBC: 4.75 MIL/uL (ref 3.70–5.45)
RDW: 15.6 % — ABNORMAL HIGH (ref 11.2–14.5)
WBC Count: 9.5 10*3/uL (ref 3.9–10.3)

## 2017-03-05 NOTE — Progress Notes (Signed)
[facility] CONSULT NOTE  Patient Care Team: Maury Dus, MD as PCP - General (Family Medicine)  CHIEF COMPLAINTS/PURPOSE OF CONSULTATION:  Newly diagnosed breast cancer  HISTORY OF PRESENTING ILLNESS:  Melody Zhang 62 y.o. female is here because of recent diagnosis of left breast cancer.  Patient had a routine screening mammogram of the left breast which showed a 1.9 cm mass.  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  She was presented this morning in the multidisciplinary tumor board and she is here today to discuss her treatment plan.  She is here today accompanied by multiple friends of her.  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  I reviewed her records extensively and collaborated the history with the patient.  SUMMARY OF ONCOLOGIC HISTORY:   Malignant neoplasm of upper-inner quadrant of left breast in female, estrogen receptor positive ([place])   02/24/2017 Initial Diagnosis    Screening detected left breast mass UIQ 1130 position: 1.9 cm, ultrasound biopsy: IDC grade 2, DCIS, ER 100%, PR 20%, Ki-67 25%, HER-2 negative ratio 1.43, T1 cN0 stage I a AJCC 8      MEDICAL HISTORY:  Past Medical History:  Diagnosis Date  . Diabetes mellitus   . Hyperlipemia   . Hypertension   . Paroxysmal SVT (supraventricular tachycardia) (HCC)    Myoview was performed 03/26/11: Low risk with small partially reversible apical perfusion defect likely breast attenuation, no ischemia, no scar, EF 62%.     SURGICAL HISTORY: Past Surgical History:  Procedure Laterality Date  . ABDOMINAL HYSTERECTOMY    . HERNIA REPAIR      SOCIAL HISTORY: Social History   Socioeconomic History  . Marital status: Divorced    Spouse name: Not on file  . Number of children: Not on file  . Years of education: Not on file  . Highest education level: Not  on file  Social Needs  . Financial resource strain: Not on file  . Food insecurity - worry: Not on file  . Food insecurity - inability: Not on file  . Transportation needs - medical: Not on file  . Transportation needs - non-medical: Not on file  Occupational History  . Not on file  Tobacco Use  . Smoking status: Never Smoker  . Smokeless tobacco: Never Used  Substance and Sexual Activity  . Alcohol use: No  . Drug use: No  . Sexual activity: No  Other Topics Concern  . Not on file  Social History Narrative  . Not on file    FAMILY HISTORY: Family History  Problem Relation Age of Onset  . Healthy Mother        There is no family history of arrythmia  . Multiple myeloma Mother   . Heart attack Father        There is no family history of arrythmia    ALLERGIES:  is allergic to lisinopril.  MEDICATIONS:  Current Outpatient Medications  Medication Sig Dispense Refill  . aspirin 81 MG tablet Take 81 mg by mouth daily.    Marland Kitchen atorvastatin (LIPITOR) 40 MG tablet Take 40 mg by mouth at bedtime.    Marland Kitchen diltiazem (CARDIZEM CD) 120 MG 24 hr capsule TAKE ONE CAPSULE BY MOUTH ONCE DAILY 15 capsule 0  . KLOR-CON M20 20 MEQ tablet TAKE ONE TABLET BY MOUTH EVERY DAY 15 tablet 0  .  metFORMIN (GLUCOPHAGE) 500 MG tablet Take 750 mg by mouth daily with breakfast.     . Multiple Vitamin (MULITIVITAMIN WITH MINERALS) TABS Take 1 tablet by mouth daily.    . Probiotic Product (ACIDOPHILUS/BIFIDUS PO) Take 1 tablet by mouth.    . VALSARTAN-HYDROCHLOROTHIAZIDE PO Take 1 tablet by mouth daily.     . vitamin B-12 (CYANOCOBALAMIN) 500 MCG tablet Take 500 mcg by mouth daily.    . vitamin C (ASCORBIC ACID) 500 MG tablet Take 500 mg by mouth daily.    . nitroGLYCERIN (NITROSTAT) 0.4 MG SL tablet Place 1 tablet (0.4 mg total) under the tongue every 5 (five) minutes x 3 doses as needed for chest pain. 25 tablet 3   No current facility-administered medications for this visit.     REVIEW OF SYSTEMS:    Constitutional: Denies fevers, chills or abnormal night sweats Eyes: Denies blurriness of vision, double vision or watery eyes Ears, nose, mouth, throat, and face: Denies mucositis or sore throat Respiratory: Denies cough, dyspnea or wheezes Cardiovascular: Denies palpitation, chest discomfort or lower extremity swelling Gastrointestinal:  Denies nausea, heartburn or change in bowel habits Skin: Denies abnormal skin rashes Lymphatics: Denies new lymphadenopathy or easy bruising Neurological:Denies numbness, tingling or new weaknesses Behavioral/Psych: Mood is stable, no new changes  Breast:  Denies any palpable lumps or discharge All other systems were reviewed with the patient and are negative.  PHYSICAL EXAMINATION: ECOG PERFORMANCE STATUS: 0 - Asymptomatic  Vitals:   03/05/17 1235  BP: 132/73  Pulse: 70  Resp: 18  Temp: 98.7 F (37.1 C)  SpO2: 99%   Filed Weights   03/05/17 1235  Weight: 271 lb 14.4 oz (123.3 kg)    GENERAL:alert, no distress and comfortable SKIN: skin color, texture, turgor are normal, no rashes or significant lesions EYES: normal, conjunctiva are pink and non-injected, sclera clear OROPHARYNX:no exudate, no erythema and lips, buccal mucosa, and tongue normal  NECK: supple, thyroid normal size, non-tender, without nodularity LYMPH:  no palpable lymphadenopathy in the cervical, axillary or inguinal LUNGS: clear to auscultation and percussion with normal breathing effort HEART: regular rate & rhythm and no murmurs and no lower extremity edema ABDOMEN:abdomen soft, non-tender and normal bowel sounds Musculoskeletal:no cyanosis of digits and no clubbing  PSYCH: alert & oriented x 3 with fluent speech NEURO: no focal motor/sensory deficits BREAST: No palpable nodules in breast. No palpable axillary or supraclavicular lymphadenopathy (exam performed in the presence of a chaperone)   LABORATORY DATA:  I have reviewed the data as listed Lab Results   Component Value Date   WBC 9.5 03/05/2017   HGB 11.9 (L) 03/17/2011   HCT 37.7 03/05/2017   MCV 79.4 (L) 03/05/2017   PLT 252 03/05/2017   Lab Results  Component Value Date   NA 140 03/05/2017   K 3.5 03/05/2017   CL 102 03/05/2017   CO2 26 03/05/2017    RADIOGRAPHIC STUDIES: I have personally reviewed the radiological reports and agreed with the findings in the report.  ASSESSMENT AND PLAN:  Malignant neoplasm of upper-inner quadrant of left breast in female, estrogen receptor positive ([place]) 02/24/2017: Screening detected left breast mass UIQ 1130 position: 1.9 cm, ultrasound biopsy: IDC grade 2, DCIS, ER 100%, PR 20%, Ki-67 25%, HER-2 negative ratio 1.43, T1 cN0 stage I a AJCC 8  Pathology and radiology counseling:Discussed with the patient, the details of pathology including the type of breast cancer,the clinical staging, the significance of ER, PR and HER-2/neu receptors and the implications  for treatment. After reviewing the pathology in detail, we proceeded to discuss the different treatment options between surgery, radiation, chemotherapy, antiestrogen therapies.  Recommendations: Patient has 2 family members with breast cancers in the 16s and 57s on the maternal side.  I believe she qualifies for genetic testing.  1. Breast conserving surgery followed by 2. Oncotype DX testing to determine if chemotherapy would be of any benefit followed by 3. Adjuvant radiation therapy followed by 4. Adjuvant antiestrogen therapy  Oncotype counseling: I discussed Oncotype DX test. I explained to the patient that this is a 21 gene panel to evaluate patient tumors DNA to calculate recurrence score. This would help determine whether patient has high risk or intermediate risk or low risk breast cancer. She understands that if her tumor was found to be high risk, she would benefit from systemic chemotherapy. If low risk, no need of chemotherapy. If she was found to be intermediate risk, we would  need to evaluate the score as well as other risk factors and determine if an abbreviated chemotherapy may be of benefit.  Return to clinic after surgery to discuss final pathology report and then determine if Oncotype DX testing will need to be sent.  All questions were answered. The patient knows to call the clinic with any problems, questions or concerns.    Harriette Ohara, MD 03/05/17

## 2017-03-05 NOTE — Progress Notes (Signed)
Nutrition Assessment  Reason for Assessment:  Pt seen in Breast Clinic  ASSESSMENT:   62 year old female with new diagnosis of breast cancer.  Past medical history HTN, DM.   Patient reports good appetite.  Medications:  reviewed  Labs: reviewed  Anthropometrics:   Height: 67 inches Weight: 271 lb BMI: 42   NUTRITION DIAGNOSIS: Food and nutrition related knowledge deficit related to new diagnosis of breast cancer as evidenced by no prior need for nutrition related information.  INTERVENTION:   Discussed and provided packet of information regarding nutritional tips for breast cancer patients.  Questions answered.  Teachback method used.  Contact information provided and patient knows to contact me with questions/concerns.    MONITORING, EVALUATION, and GOAL: Pt will consume a healthy plant based diet to maintain lean body mass throughout treatment.   Joli B. Zenia Resides, [place], [place] Registered Dietitian [phone] (pager)

## 2017-03-05 NOTE — Patient Instructions (Signed)

## 2017-03-05 NOTE — Assessment & Plan Note (Signed)
02/24/2017: Screening detected left breast mass UIQ 1130 position: 1.9 cm, ultrasound biopsy: IDC grade 2, DCIS, ER 100%, PR 20%, Ki-67 25%, HER-2 negative ratio 1.43, T1 cN0 stage I a AJCC 8  Pathology and radiology counseling:Discussed with the patient, the details of pathology including the type of breast cancer,the clinical staging, the significance of ER, PR and HER-2/neu receptors and the implications for treatment. After reviewing the pathology in detail, we proceeded to discuss the different treatment options between surgery, radiation, chemotherapy, antiestrogen therapies.  Recommendations: 1. Breast conserving surgery followed by 2. Oncotype DX testing to determine if chemotherapy would be of any benefit followed by 3. Adjuvant radiation therapy followed by 4. Adjuvant antiestrogen therapy  Oncotype counseling: I discussed Oncotype DX test. I explained to the patient that this is a 21 gene panel to evaluate patient tumors DNA to calculate recurrence score. This would help determine whether patient has high risk or intermediate risk or low risk breast cancer. She understands that if her tumor was found to be high risk, she would benefit from systemic chemotherapy. If low risk, no need of chemotherapy. If she was found to be intermediate risk, we would need to evaluate the score as well as other risk factors and determine if an abbreviated chemotherapy may be of benefit.  Return to clinic after surgery to discuss final pathology report and then determine if Oncotype DX testing will need to be sent.

## 2017-03-05 NOTE — Therapy (Signed)
[place], [place], [address] Phone: [phone]   Fax:  [phone]  Physical Therapy Evaluation  Patient Details  Name: Melody Zhang MRN: [id number] Date of Birth: Dec 09, 1955 Referring Provider: Dr. Fanny Skates   Encounter Date: 03/05/2017  PT End of Session - 03/05/17 1541    Visit Number  1    Number of Visits  2    Date for PT Re-Evaluation  04/30/17    PT Start Time  9    PT Stop Time  3267 Also saw pt from 1450-1500 for a total of 26 minutes    PT Time Calculation (min)  16 min    Activity Tolerance  Patient tolerated treatment well    Behavior During Therapy  [facility] for tasks assessed/performed       Past Medical History:  Diagnosis Date  . Diabetes mellitus   . Hyperlipemia   . Hypertension   . Paroxysmal SVT (supraventricular tachycardia) (HCC)    Myoview was performed 03/26/11: Low risk with small partially reversible apical perfusion defect likely breast attenuation, no ischemia, no scar, EF 62%.     Past Surgical History:  Procedure Laterality Date  . ABDOMINAL HYSTERECTOMY    . HERNIA REPAIR      There were no vitals filed for this visit.   Subjective Assessment - 03/05/17 1534    Subjective  Patient reports she is here today to be seen by her medical team for her newly diagnosed left breast cancer.    Patient is accompained by:  Family member    Pertinent History  Patient was diagnosed on 02/13/17 with left grade 2 invasive ductal carcinoma breast cancer. It measures 1.9 cm and is located in the upper inner quadrant. It is ER/PR positive and HER2 negative with a Ki67 of 25%.    Patient Stated Goals  reduce lymphedema risk and learn post op shoulder ROM HEP     Currently in Pain?  No/denies         [facility] PT Assessment - 03/05/17 0001      Assessment   Medical Diagnosis  Left breast cancer    Referring Provider  Dr. Fanny Skates    Onset Date/Surgical Date   02/13/17    Hand Dominance  Right    Prior Therapy  none      Precautions   Precautions  Other (comment)    Precaution Comments  active cancer      Restrictions   Weight Bearing Restrictions  No      Balance Screen   Has the patient fallen in the past 6 months  No    Has the patient had a decrease in activity level because of a fear of falling?   No    Is the patient reluctant to leave their home because of a fear of falling?   No      Home Environment   Living Environment  Private residence    Living Arrangements  Children 62 y.o. 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    Leisure  She does not exercise      Cognition   Overall Cognitive Status  Within Functional Limits for tasks assessed      Posture/Postural Control   Posture/Postural Control  Postural limitations    Postural Limitations  Rounded Shoulders;Forward head      ROM / Strength   AROM / PROM / Strength  AROM;Strength      AROM   AROM Assessment Site  Shoulder;Cervical    Right/Left Shoulder  Right;Left    Right Shoulder Extension  55 Degrees    Right Shoulder Flexion  151 Degrees    Right Shoulder ABduction  158 Degrees    Right Shoulder Internal Rotation  65 Degrees    Right Shoulder External Rotation  64 Degrees    Left Shoulder Extension  68 Degrees    Left Shoulder Flexion  141 Degrees    Left Shoulder ABduction  155 Degrees    Left Shoulder Internal Rotation  70 Degrees    Left Shoulder External Rotation  77 Degrees    Cervical Flexion  WNL    Cervical Extension  WNL    Cervical - Right Side Bend  WNL    Cervical - Left Side Bend  WNL    Cervical - Right Rotation  WNL    Cervical - Left Rotation  WNL      Strength   Overall Strength  Within functional limits for tasks performed        LYMPHEDEMA/ONCOLOGY QUESTIONNAIRE - 03/05/17  1539      Type   Cancer Type  Left breast cancer      Lymphedema Assessments   Lymphedema Assessments  Upper extremities      Right Upper Extremity Lymphedema   10 cm Proximal to Olecranon Process  36.5 cm    Olecranon Process  28 cm    10 cm Proximal to Ulnar Styloid Process  22.9 cm    Just Proximal to Ulnar Styloid Process  17.5 cm    Across Hand at [employer]  19.9 cm    At [place] of 2nd Digit  7.2 cm      Left Upper Extremity Lymphedema   10 cm Proximal to Olecranon Process  36.8 cm    Olecranon Process  27.8 cm    10 cm Proximal to Ulnar Styloid Process  22.8 cm    Just Proximal to Ulnar Styloid Process  17.2 cm    Across Hand at [employer]  20.4 cm    At [place] of 2nd Digit  7.1 cm          Objective measurements completed on examination: See above findings.    Patient was instructed today in a home exercise program today for post op shoulder range of motion. These included active assist shoulder flexion in sitting, scapular retraction, wall walking with shoulder abduction, and hands behind head external rotation.  She was encouraged to do these twice a day, holding 3 seconds and repeating 5 times when permitted by her physician.     PT Education - 03/05/17 1540    Education provided  Yes    Education Details  Lymphedema risk reduction and post op shoulder ROM HEP    Person(s) Educated  Patient;Other (comment) Sister, daughter, partner    Methods  Explanation;Demonstration;Handout    Comprehension  Returned demonstration;Verbalized understanding          PT Long Term Goals - 03/05/17 1544      PT LONG TERM GOAL #1   Title  Patient will demonstrate she has returned to baseline related to shoulder ROM and function post operatively.    Time  8    Period  Weeks  Status  New      Breast Clinic Goals - 03/05/17 1544      Patient will be able to verbalize understanding of pertinent lymphedema risk reduction practices relevant to her diagnosis  specifically related to skin care.   Time  1    Period  Days    Status  Achieved      Patient will be able to return demonstrate and/or verbalize understanding of the post-op home exercise program related to regaining shoulder range of motion.   Time  1    Period  Days    Status  Achieved      Patient will be able to verbalize understanding of the importance of attending the postoperative After Breast Cancer Class for further lymphedema risk reduction education and therapeutic exercise.   Time  1    Period  Days    Status  Achieved            Plan - 03/05/17 1542    Clinical Impression Statement  Patient was diagnosed on 02/13/17 with left grade 2 invasive ductal carcinoma breast cancer. It measures 1.9 cm and is located in the upper inner quadrant. It is ER/PR positive and HER2 negative with a Ki67 of 25%. Her multidisciplinary medical team met prior to her assessments to determine a recommended treatment plan. She is planning to have a left lumpectomy and sentinel node biopsy followed by Oncotype testing, radiation, and anti-estrogen therapy. She will benefit from a post op PT visit to reassess and determine needs.    History and Personal Factors relevant to plan of care:  None    Clinical Presentation  Stable    Clinical Decision Making  Low    Rehab Potential  Excellent    Clinical Impairments Affecting Rehab Potential  None    PT Frequency  -- Eval and 1 f/u visit    PT Treatment/Interventions  ADLs/Self Care Home Management;Therapeutic exercise;Patient/family education    PT Next Visit Plan  Will reassess 3-4 weeks post op to determine PT needs    PT Home Exercise Plan  Post op shoulder ROM HEP    Consulted and Agree with Plan of Care  Patient;Family member/caregiver    Family Member Consulted  Sister, daughter, partner       Patient will benefit from skilled therapeutic intervention in order to improve the following deficits and impairments:  Decreased knowledge of  precautions, Decreased range of motion, Impaired UE functional use, Postural dysfunction, Pain  Visit Diagnosis: Malignant neoplasm of upper-inner quadrant of left breast in female, estrogen receptor positive ([place]) - Plan: PT plan of care cert/re-cert  Abnormal posture - Plan: PT plan of care cert/re-cert   Patient will follow up at outpatient cancer rehab 3-4 weeks following surgery.  If the patient requires physical therapy at that time, a specific plan will be dictated and sent to the referring physician for approval. The patient was educated today on appropriate basic range of motion exercises to begin post operatively and the importance of attending the After Breast Cancer class following surgery.  Patient was educated today on lymphedema risk reduction practices as it pertains to recommendations that will benefit the patient immediately following surgery.  She verbalized good understanding.     Problem List Patient Active Problem List   Diagnosis Date Noted  . Malignant neoplasm of upper-inner quadrant of left breast in female, estrogen receptor positive ([place]) 03/04/2017  . SVT (supraventricular tachycardia) ([place]) 03/18/2011  . Hypokalemia 03/18/2011  . Diabetes mellitus  03/18/2011  . HYPERLIPIDEMIA 01/30/2007  . HYPERTENSION 01/30/2007  . ALLERGIC RHINITIS 01/30/2007    Annia Friendly, PT 03/05/17 3:46 PM  [place], [place], [address] Phone: [phone]   Fax:  [phone]  Name: Melody Zhang MRN: [id number] Date of Birth: [birth year]

## 2017-03-05 NOTE — Progress Notes (Signed)
Radiation Oncology         [phone] ________________________________  Name: Melody Zhang        MRN: [id number]  Date of Service: 03/05/2017 DOB: March 11, 1955  HW:[id number], Melody [place], MD  Melody Skates, MD     REFERRING PHYSICIAN: Fanny Skates, MD   DIAGNOSIS: The encounter diagnosis was Malignant neoplasm of upper-inner quadrant of left breast in female, estrogen receptor positive ([place]).   HISTORY OF PRESENT ILLNESS: Melody Zhang is a 62 y.o. female seen in the multidisciplinary breast clinic for a new diagnosis of left breast cancer. The patient was noted to have screening detected abnormality in the left breast. Diagnostic imaging on 02/18/16 revealed a 1.9 x 1.4 x .9 cm mass at 11:30 location the axilla was negative for adenopathy, and biopsy on 02/24/17 revealed a grade 2 invasive ductal carcinoma with DCIS, ER/PR positive, HER2 negative, with a ki67 of 25%. She comes today to discuss options of treatment for her cancer.    PREVIOUS RADIATION THERAPY: No   PAST MEDICAL HISTORY:  Past Medical History:  Diagnosis Date  . Diabetes mellitus   . Hyperlipemia   . Hypertension   . Paroxysmal SVT (supraventricular tachycardia) (HCC)    Myoview was performed 03/26/11: Low risk with small partially reversible apical perfusion defect likely breast attenuation, no ischemia, no scar, EF 62%.        PAST SURGICAL HISTORY: Past Surgical History:  Procedure Laterality Date  . ABDOMINAL HYSTERECTOMY    . HERNIA REPAIR       FAMILY HISTORY:  Family History  Problem Relation Age of Onset  . Healthy Mother        There is no family history of arrythmia  . Multiple myeloma Mother   . Heart attack Father        There is no family history of arrythmia     SOCIAL HISTORY:  reports that  has never smoked. she has never used smokeless tobacco. She reports that she does not drink alcohol or use drugs. the patient is divorced and lives in [place]. 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   ALLERGIES: Lisinopril   MEDICATIONS:  Current Outpatient Medications  Medication Sig Dispense Refill  . aspirin 81 MG tablet Take 81 mg by mouth daily.    Marland Kitchen atorvastatin (LIPITOR) 40 MG tablet Take 40 mg by mouth at bedtime.    Marland Kitchen diltiazem (CARDIZEM CD) 120 MG 24 hr capsule TAKE ONE CAPSULE BY MOUTH ONCE DAILY 15 capsule 0  . KLOR-CON M20 20 MEQ tablet TAKE ONE TABLET BY MOUTH EVERY DAY 15 tablet 0  . metFORMIN (GLUCOPHAGE) 500 MG tablet Take 750 mg by mouth daily with breakfast.     . Multiple Vitamin (MULITIVITAMIN WITH MINERALS) TABS Take 1 tablet by mouth daily.    . nitroGLYCERIN (NITROSTAT) 0.4 MG SL tablet Place 1 tablet (0.4 mg total) under the tongue every 5 (five) minutes x 3 doses as needed for chest pain. 25 tablet 3  . Probiotic Product (ACIDOPHILUS/BIFIDUS PO) Take 1 tablet by mouth.    . VALSARTAN-HYDROCHLOROTHIAZIDE PO Take 1 tablet by mouth daily.     . vitamin B-12 (CYANOCOBALAMIN) 500 MCG tablet Take 500 mcg by mouth daily.    . vitamin C (ASCORBIC ACID) 500 MG tablet Take 500 mg by mouth daily.     No current facility-administered medications for this encounter.      REVIEW OF SYSTEMS: On review of systems, the patient reports  that she is doing well overall. She denies any chest pain, shortness of breath, cough, fevers, chills, night sweats, unintended weight changes. She denies any bowel or bladder disturbances, and denies abdominal pain, nausea or vomiting. She denies any new musculoskeletal or joint aches or pains. A complete review of systems is obtained and is otherwise negative.     PHYSICAL EXAM:  Wt Readings from Last 3 Encounters:  03/05/17 271 lb 14.4 oz (123.3 kg)  08/21/15 277 lb (125.6 kg)  06/15/15 277 lb 3.2 oz (125.7 kg)   Temp Readings from Last 3 Encounters:  03/05/17 98.7 F (37.1 C) (Oral)  03/18/11 98 F (36.7 C) (Oral)   BP Readings from Last 3 Encounters:  03/05/17 132/73  06/15/15 (!) 142/96  09/19/11  124/82   Pulse Readings from Last 3 Encounters:  03/05/17 70  06/15/15 75  09/19/11 71     In general this is a well appearing African American female in no acute distress. She is alert and oriented x4 and appropriate throughout the examination. HEENT reveals that the patient is normocephalic, atraumatic. EOMs are intact. PERRLA. Skin is intact without any evidence of gross lesions. Cardiovascular exam reveals a regular rate and rhythm, no clicks rubs or murmurs are auscultated. Chest is clear to auscultation bilaterally. Lymphatic assessment is performed and does not reveal any adenopathy in the cervical, supraclavicular, axillary, or inguinal chains. Bilateral breast exam is performed and reveals pendulous breasts with skin tags along the inframammary folds. No palpable mass is noted of either breast, but the post biopsy site is noted on the left breast. No nipple bleeding or discharge is noted of either breast. Abdomen has active bowel sounds in all quadrants and is intact. The abdomen is soft, non tender, non distended. Lower extremities are negative for pretibial pitting edema, deep calf tenderness, cyanosis or clubbing.   ECOG = 0  0 - Asymptomatic (Fully active, able to carry on all predisease activities without restriction)  1 - Symptomatic but completely ambulatory (Restricted in physically strenuous activity but ambulatory and able to carry out work of a light or sedentary nature. For example, light housework, office work)  2 - Symptomatic, <50% in bed during the day (Ambulatory and capable of all self care but unable to carry out any work activities. Up and about more than 50% of waking hours)  3 - Symptomatic, >50% in bed, but not bedbound (Capable of only limited self-care, confined to bed or chair 50% or more of waking hours)  4 - Bedbound (Completely disabled. Cannot carry on any self-care. Totally confined to bed or chair)  5 - Death   Eustace Pen MM, Creech RH, Tormey DC, et al.  [phone]). "Toxicity and response criteria of the [facility]". [place] Oncol. 5 (6): 649-55    LABORATORY DATA:  Lab Results  Component Value Date   WBC 9.5 03/05/2017   HGB 11.9 (L) 03/17/2011   HCT 37.7 03/05/2017   MCV 79.4 (L) 03/05/2017   PLT 252 03/05/2017   Lab Results  Component Value Date   NA 140 03/05/2017   K 3.5 03/05/2017   CL 102 03/05/2017   CO2 26 03/05/2017   Lab Results  Component Value Date   ALT 13 03/05/2017   AST 14 03/05/2017   ALKPHOS 73 03/05/2017   BILITOT 0.3 03/05/2017      RADIOGRAPHY: Mm Digital Screening Bilateral  Result Date: 02/13/2017 CLINICAL DATA:  Screening. EXAM: DIGITAL SCREENING BILATERAL MAMMOGRAM WITH CAD COMPARISON:  Previous exam(s). ACR Breast Density Category b: There are scattered areas of fibroglandular density. FINDINGS: In the left breast, a possible mass warrants further evaluation. In the right breast, no findings suspicious for malignancy. Images were processed with CAD. IMPRESSION: Further evaluation is suggested for possible mass in the left breast. RECOMMENDATION: Diagnostic mammogram and possibly ultrasound of the left breast. (Code:[id number]) The patient will be contacted regarding the findings, and additional imaging will be scheduled. BI-RADS CATEGORY  0: Incomplete. Need additional imaging evaluation and/or prior mammograms for comparison. Electronically Signed   By: Ammie Ferrier M.D.   On: 02/13/2017 10:04   US Breast Ltd Uni Left Inc Axilla  Result Date: 02/17/2017 CLINICAL DATA:  62 year old female for further evaluation of possible left breast mass on screening mammogram. EXAM: 2D DIGITAL DIAGNOSTIC LEFT MAMMOGRAM WITH CAD AND ADJUNCT TOMO ULTRASOUND LEFT BREAST COMPARISON:  Previous exam(s). ACR Breast Density Category b: There are scattered areas of fibroglandular density. FINDINGS: 2D and 3D full field views of the left breast demonstrate an irregular mass within the upper inner  right breast. Mammographic images were processed with CAD. On physical examination, a firm area of thickening is identified at the 11:30 position of the left breast 1 cm from the nipple. Targeted ultrasound is performed, showing a 1.4 x 0.9 x 1.9 cm irregular hypoechoic mass at the 11:30 position of the left breast 1 cm from the nipple, corresponding to the mammographic finding. No abnormal left axillary lymph nodes are identified. IMPRESSION: 1. Suspicious 1.9 cm mass within the upper inner left breast. Tissue sampling recommended. No abnormal left axillary lymph nodes. RECOMMENDATION: Ultrasound-guided left breast biopsy, which will be arranged. I have discussed the findings and recommendations with the patient. Results were also provided in writing at the conclusion of the visit. If applicable, a reminder letter will be sent to the patient regarding the next appointment. BI-RADS CATEGORY  4: Suspicious. Electronically Signed   By: Margarette Canada M.D.   On: 02/17/2017 11:44   Mm Diag Breast Tomo Uni Left  Result Date: 02/17/2017 CLINICAL DATA:  62 year old female for further evaluation of possible left breast mass on screening mammogram. EXAM: 2D DIGITAL DIAGNOSTIC LEFT MAMMOGRAM WITH CAD AND ADJUNCT TOMO ULTRASOUND LEFT BREAST COMPARISON:  Previous exam(s). ACR Breast Density Category b: There are scattered areas of fibroglandular density. FINDINGS: 2D and 3D full field views of the left breast demonstrate an irregular mass within the upper inner right breast. Mammographic images were processed with CAD. On physical examination, a firm area of thickening is identified at the 11:30 position of the left breast 1 cm from the nipple. Targeted ultrasound is performed, showing a 1.4 x 0.9 x 1.9 cm irregular hypoechoic mass at the 11:30 position of the left breast 1 cm from the nipple, corresponding to the mammographic finding. No abnormal left axillary lymph nodes are identified. IMPRESSION: 1. Suspicious 1.9 cm mass  within the upper inner left breast. Tissue sampling recommended. No abnormal left axillary lymph nodes. RECOMMENDATION: Ultrasound-guided left breast biopsy, which will be arranged. I have discussed the findings and recommendations with the patient. Results were also provided in writing at the conclusion of the visit. If applicable, a reminder letter will be sent to the patient regarding the next appointment. BI-RADS CATEGORY  4: Suspicious. Electronically Signed   By: Margarette Canada M.D.   On: 02/17/2017 11:44   Mm Clip Placement Left  Result Date: 02/24/2017 CLINICAL DATA:  Ultrasound-guided biopsy was performed of a mass in the upper-outer  quadrant of the left breast. EXAM: DIAGNOSTIC LEFT MAMMOGRAM POST ULTRASOUND BIOPSY COMPARISON:  Previous exam(s). FINDINGS: Mammographic images were obtained following ultrasound guided biopsy of a mass in the 11:30 position of the left breast. A ribbon shaped biopsy clip is satisfactorily positioned within the mass. IMPRESSION: Satisfactory position of ribbon shaped biopsy clip. Final Assessment: Post Procedure Mammograms for Marker Placement Electronically Signed   By: Curlene Dolphin M.D.   On: 02/24/2017 13:31   [place] Lt Breast Bx W Loc Dev 1st Lesion Img Bx Spec US Guide  Addendum Date: 02/27/2017   ADDENDUM REPORT: 02/26/2017 08:24 ADDENDUM: Pathology revealed INVASIVE DUCTAL CARCINOMA, INTERMEDIATE GRADE, ARISING IN ASSOCIATION WITH INTERMEDIATE GRADE DUCTAL CARCINOMA IN SITU of the Left breast, upper inner quadrant, 11:30 1 cmfn. This was found to be concordant by Dr. Curlene Dolphin. Pathology results were discussed with the patient by telephone. The patient reported doing well after the biopsy with tenderness at the site. Post biopsy instructions and care were reviewed and questions were answered. The patient was encouraged to call [facility] for any additional concerns. The patient was referred to [facility] on March 05, 2017. Pathology results reported by Terie Purser, RN on 02/26/2017. Electronically Signed   By: Curlene Dolphin M.D.   On: 02/26/2017 08:24   Result Date: 02/27/2017 CLINICAL DATA:  Ultrasound-guided biopsy was recommended for suspicious left breast mass 11:30 position. EXAM: ULTRASOUND GUIDED LEFT BREAST CORE NEEDLE BIOPSY COMPARISON:  Previous exam(s). FINDINGS: I met with the patient and we discussed the procedure of ultrasound-guided biopsy, including benefits and alternatives. We discussed the high likelihood of a successful procedure. We discussed the risks of the procedure, including infection, bleeding, tissue injury, clip migration, and inadequate sampling. Informed written consent was given. The usual time-out protocol was performed immediately prior to the procedure. Lesion quadrant: Upper inner quadrant Using sterile technique and 1% Lidocaine as local anesthetic, under direct ultrasound visualization, a 12 gauge spring-loaded device was used to perform biopsy of a 1.9 cm regular mass at 11:30 position from the nipple using a lateral to medial approach. At the conclusion of the procedure a ribbon shaped tissue marker clip was deployed into the biopsy cavity. Follow up 2 view mammogram was performed and dictated separately. IMPRESSION: Ultrasound guided biopsy of the left breast. No apparent complications. Electronically Signed: By: Curlene Dolphin M.D. On: 02/24/2017 13:30       IMPRESSION/PLAN: 1. 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 Dr. Lisbeth Renshaw discusses the pathology findings and reviews the nature of invasive breast disease. The consensus from the breast conference include breast conservation with lumpectomy with with sentinel mapping. Dr. Lindi Adie recommends submitting her surgical specimen for oncotype dx score to determine a role for systemic therapy. Provided that chemotherapy  is not indicated, the patient's course would then be followed by external radiotherapy to the breast followed by antiestrogen therapy. We discussed the risks, benefits, short, and long term effects of radiotherapy, and the patient is interested in proceeding. Dr. Lisbeth Renshaw discusses the delivery and logistics of radiotherapy and anticipates a course of 4 weeks with deep inspiration breath hold technique. We also discussed the possibility of treating her prone due to her pendulous breasts. We will see her back about 2 weeks after surgery to move forward with the simulation and planning process and anticipate starting radiotherapy about 4 weeks after surgery.  The above documentation reflects my direct findings during this shared patient visit. Please see the separate note by Dr. Lisbeth Renshaw on this date for the remainder of the patient's plan of care.    Carola Rhine, PAC

## 2017-03-06 ENCOUNTER — Telehealth: Payer: Self-pay | Admitting: General Practice

## 2017-03-06 ENCOUNTER — Encounter: Payer: Self-pay | Admitting: General Practice

## 2017-03-06 NOTE — Telephone Encounter (Signed)
[place] Psychosocial Distress Screening Clinical Social Work  Clinical Social Work was referred by distress screening protocol.  The patient scored a 7 on the Psychosocial Distress Thermometer which indicates moderate distress. Clinical Social Worker Edwyna Shell to assess for distress and other psychosocial needs. Patient at work, did not want to talk in detail, states "I am fine now."  Willing to have [place] mail packet of information re [place], encouraged patient to review and call as needed.    ONCBCN DISTRESS SCREENING 03/06/2017  Screening Type Initial Screening  Distress experienced in past week (1-10) 7  Emotional problem type Nervousness/Anxiety  Spiritual/Religous concerns type Facing my mortality  Information Concerns Type Lack of info about treatment  Referral to support programs Yes    Clinical Social Worker follow up needed: No.  If yes, follow up plan:  Edwyna Shell, LCSW Clinical Social Worker Phone:  [phone]

## 2017-03-06 NOTE — Progress Notes (Signed)
[place] Psychosocial Distress Screening Spiritual Care  Reached [place] by phone following Breast Multidisciplinary Clinic to introduce [place] team/resources, reviewing distress screen per protocol.  The patient scored a 7 on the Psychosocial Distress Thermometer which indicates severe distress. Also assessed for distress and other psychosocial needs.   ONCBCN DISTRESS SCREENING 03/06/2017  Screening Type Initial Screening  Distress experienced in past week (1-10) 7  Emotional problem type Nervousness/Anxiety  Spiritual/Religous concerns type Facing my mortality  Information Concerns Type Lack of info about treatment  Referral to support programs Yes   Ms Brittian welcomed f/u call, sharing that she has good support from family, friends, and coworkers. Her biggest source of distress remains the emotional aspect of coping. [place] team, breast cancer support group, Facilities manager mentors, and other programming resources, which pt plans to explore in more detail.  Follow up needed: No. Mailing whole packet of [place], including handwritten card of encouragement. Pt is aware of ongoing Spiritual Care availability, but please also page if immediate needs arise. Thank you.   [place], [place], [facility] Pager [phone] Voicemail [phone]

## 2017-03-07 ENCOUNTER — Other Ambulatory Visit (HOSPITAL_BASED_OUTPATIENT_CLINIC_OR_DEPARTMENT_OTHER): Payer: Self-pay

## 2017-03-07 DIAGNOSIS — G4730 Sleep apnea, unspecified: Secondary | ICD-10-CM

## 2017-03-07 DIAGNOSIS — R0683 Snoring: Secondary | ICD-10-CM

## 2017-03-07 DIAGNOSIS — G4710 Hypersomnia, unspecified: Secondary | ICD-10-CM

## 2017-03-10 ENCOUNTER — Inpatient Hospital Stay: Payer: 59

## 2017-03-10 ENCOUNTER — Inpatient Hospital Stay (HOSPITAL_BASED_OUTPATIENT_CLINIC_OR_DEPARTMENT_OTHER): Payer: 59 | Admitting: Genetic Counselor

## 2017-03-10 DIAGNOSIS — Z800 Family history of malignant neoplasm of digestive organs: Secondary | ICD-10-CM

## 2017-03-10 DIAGNOSIS — Z170 Estrogen receptor positive status [ER+]: Secondary | ICD-10-CM

## 2017-03-10 DIAGNOSIS — C50212 Malignant neoplasm of upper-inner quadrant of left female breast: Secondary | ICD-10-CM | POA: Diagnosis not present

## 2017-03-10 DIAGNOSIS — Z803 Family history of malignant neoplasm of breast: Secondary | ICD-10-CM

## 2017-03-11 ENCOUNTER — Encounter: Payer: Self-pay | Admitting: Genetic Counselor

## 2017-03-11 DIAGNOSIS — Z803 Family history of malignant neoplasm of breast: Secondary | ICD-10-CM | POA: Insufficient documentation

## 2017-03-11 DIAGNOSIS — Z800 Family history of malignant neoplasm of digestive organs: Secondary | ICD-10-CM | POA: Insufficient documentation

## 2017-03-11 NOTE — Progress Notes (Signed)
REFERRING PROVIDER: Nicholas Lose, MD [place], [place] [address]  PRIMARY PROVIDER:  Maury Dus, MD  PRIMARY REASON FOR VISIT:  1. Malignant neoplasm of upper-inner quadrant of left breast in female, estrogen receptor positive ([facility])   2. Family history of breast cancer   3. Family history of pancreatic cancer      HISTORY OF PRESENT ILLNESS:   Melody Zhang, a 62 y.o. female, was seen for a [facility] cancer genetics consultation at the request of Dr. Lindi Adie due to a personal and family history of cancer.  Melody Zhang presents to clinic today to discuss the possibility of a hereditary predisposition to cancer, genetic testing, and to further clarify her future cancer risks, as well as potential cancer risks for family members.   In 2019, at the age of 4, Melody Zhang was diagnosed with Invasive ductal carcinoma of the left breast. This will be treated with lumpectomy and radiation, depending on how the oncotype comes out.  She is here for genetic testing prior to her surgery to determine if her breast cancer is due to a hereditary cause.     CANCER HISTORY:    Malignant neoplasm of upper-inner quadrant of left breast in female, estrogen receptor positive ([facility])   02/24/2017 Initial Diagnosis    Screening detected left breast mass UIQ 1130 position: 1.9 cm, ultrasound biopsy: IDC grade 2, DCIS, ER 100%, PR 20%, Ki-67 25%, HER-2 negative ratio 1.43, T1 cN0 stage I a AJCC 8        HORMONAL RISK FACTORS:  Menarche was at age 36.  First live birth at age 11.  OCP use for approximately 10 years.  Ovaries intact: yes.  Hysterectomy: no.  Menopausal status: postmenopausal.  HRT use: 0 years. Colonoscopy: yes; normal. Mammogram within the last year: yes. Number of breast biopsies: 1. Up to date with pelvic exams:  yes. Any excessive radiation exposure in the past:  no  Past Medical History:  Diagnosis Date  . Diabetes mellitus   . Family history of breast  cancer   . Family history of pancreatic cancer   . Hyperlipemia   . Hypertension   . Paroxysmal SVT (supraventricular tachycardia) (HCC)    Myoview was performed 03/26/11: Low risk with small partially reversible apical perfusion defect likely breast attenuation, no ischemia, no scar, EF 62%.     Past Surgical History:  Procedure Laterality Date  . ABDOMINAL HYSTERECTOMY    . HERNIA REPAIR      Social History   Socioeconomic History  . Marital status: Divorced    Spouse name: Not on file  . Number of children: Not on file  . Years of education: Not on file  . Highest education level: Not on file  Social Needs  . Financial resource strain: Not on file  . Food insecurity - worry: Not on file  . Food insecurity - inability: Not on file  . Transportation needs - medical: Not on file  . Transportation needs - non-medical: Not on file  Occupational History  . Not on file  Tobacco Use  . Smoking status: Never Smoker  . Smokeless tobacco: Never Used  Substance and Sexual Activity  . Alcohol use: No  . Drug use: No  . Sexual activity: No  Other Topics Concern  . Not on file  Social History Narrative  . Not on file     FAMILY HISTORY:  We obtained a detailed, 4-generation family history.  Significant diagnoses are listed below: Family  History  Problem Relation Age of Onset  . Healthy Mother        There is no family history of arrythmia  . Multiple myeloma Mother 44  . Heart attack Father 44       There is no family history of arrythmia  . Lymphoma Maternal Aunt   . Liver cancer Maternal Uncle   . Pancreatic cancer Maternal Grandmother 15  . Pancreatic cancer Cousin        paternal first cousin  . Breast cancer Other        mat great aunt    The patient has two boys and a girl who are all cancer free.  She has three brothers and a sister who are all cancer free, as are their children.  Both parents are deceased.  The patient's mother died at 42 from multiple  myeloma.  She had two brothers and five sisters.  One brother had liver cancer, unknown if this was the primary cancer, and a sister had lymphoma.  The maternal grandparents are deceased.  The grandmother had pancreatic cancer.  She had one sister who had breast cancer.  This sister had a daughter, and that daughter also had a daughter, all who had breast cancer.  The patient's father died of a heart attack at 54.  He had 4-5 siblings.  One sister had a daughter who had pancreatic cancer.  The paternal grandparents are deceased from non cancer related issues.  Melody Zhang is unaware of previous family history of genetic testing for hereditary cancer risks. Patient's maternal ancestors are of African American descent, and paternal ancestors are of African American descent. There is no reported Ashkenazi Jewish ancestry. There is no known consanguinity.  GENETIC COUNSELING ASSESSMENT: Melody Zhang is a 62 y.o. female with a personal and family history of cancer which is somewhat suggestive of a hereditary cancer syndrome and predisposition to cancer. We, therefore, discussed and recommended the following at today's visit.   DISCUSSION: We discussed that about 5-10% of breast cancer is hereditary with most cases due to BRCA mutations.  Other genes can be associated with both breast and pancreatic cancer including ATM and PALB2.  We reviewed the characteristics, features and inheritance patterns of hereditary cancer syndromes. We also discussed genetic testing, including the appropriate family members to test, the process of testing, insurance coverage and turn-around-time for results. We discussed the implications of a negative, positive and/or variant of uncertain significant result. We recommended Melody Zhang pursue genetic testing for the 9 gene STAT panel.  IF this is negative, we will reflex to the common hereditary cancer gene panel, which will include all of the genes in the STAT panel. The Hereditary  Gene Panel offered by [facility] includes sequencing and/or deletion duplication testing of the following 47 genes: APC, ATM, AXIN2, BARD1, BMPR1A, BRCA1, BRCA2, BRIP1, CDH1, CDK4, CDKN2A (p14ARF), CDKN2A (p16INK4a), CHEK2, CTNNA1, DICER1, EPCAM (Deletion/duplication testing only), GREM1 (promoter region deletion/duplication testing only), KIT, MEN1, MLH1, MSH2, MSH3, MSH6, MUTYH, NBN, NF1, NHTL1, PALB2, PDGFRA, PMS2, POLD1, POLE, PTEN, RAD50, RAD51C, RAD51D, SDHB, SDHC, SDHD, SMAD4, SMARCA4. STK11, TP53, TSC1, TSC2, and VHL.  The following genes were evaluated for sequence changes only: SDHA and HOXB13 c.251G>A variant only.   Based on Melody Zhang's personal and family history of cancer, she meets medical criteria for genetic testing. Despite that she meets criteria, she may still have an out of pocket cost. We discussed that if her out of pocket cost for testing is over $100,  the laboratory will call and confirm whether she wants to proceed with testing.  If the out of pocket cost of testing is less than $100 she will be billed by the genetic testing laboratory.   PLAN: After considering the risks, benefits, and limitations, Melody Zhang  provided informed consent to pursue genetic testing and the blood sample was sent to [facility] for analysis of the Common Hereditary Cancer panel. Results should be available within approximately 7-10 days, at which time we will reflex to the larger panel, which will take up to 2-3 weeks' time, at which point they will be disclosed by telephone to Melody Zhang, as will any additional recommendations warranted by these results. Melody Zhang will receive a summary of her genetic counseling visit and a copy of her results once available. This information will also be available in Epic. We encouraged Melody Zhang to remain in contact with cancer genetics annually so that we can continuously update the family history and inform her of any changes in cancer genetics and testing that  may be of benefit for her family. Melody Zhang questions were answered to her satisfaction today. Our contact information was provided should additional questions or concerns arise.  Lastly, we encouraged Melody Zhang to remain in contact with cancer genetics annually so that we can continuously update the family history and inform her of any changes in cancer genetics and testing that may be of benefit for this family.   Ms.  Zhang questions were answered to her satisfaction today. Our contact information was provided should additional questions or concerns arise. Thank you for the referral and allowing [place] to share in the care of your patient.   Melody Zhang, [place], [facility] Certified Genetic Counselor [email or web address] phone: [phone]  The patient was seen for a total of 60 minutes in face-to-face genetic counseling.  This patient was discussed with Drs. Magrinat, Lindi Adie and/or Burr Medico who agrees with the above.    _______________________________________________________________________ For Office Staff:  Number of people involved in session: 1 Was an Intern/ student involved with case: yes: Melody Zhang

## 2017-03-12 ENCOUNTER — Other Ambulatory Visit: Payer: Self-pay | Admitting: General Surgery

## 2017-03-12 DIAGNOSIS — Z170 Estrogen receptor positive status [ER+]: Secondary | ICD-10-CM

## 2017-03-12 DIAGNOSIS — C50212 Malignant neoplasm of upper-inner quadrant of left female breast: Secondary | ICD-10-CM

## 2017-03-13 ENCOUNTER — Telehealth: Payer: Self-pay | Admitting: *Deleted

## 2017-03-13 ENCOUNTER — Telehealth: Payer: Self-pay | Admitting: Hematology and Oncology

## 2017-03-13 NOTE — Telephone Encounter (Signed)
  Oncology Nurse Navigator Documentation  Navigator Location: [facility] (03/13/17 1000)   )Navigator Encounter Type: Telephone;MDC Follow-up (03/13/17 1000) Telephone: Outgoing Call;Clinic/MDC Follow-up (03/13/17 1000)     Surgery Date: 03/21/17 (03/13/17 1000)           Treatment Initiated Date: 03/21/17 (03/13/17 1000)                                Time Spent with Patient: 15 (03/13/17 1000)

## 2017-03-13 NOTE — Telephone Encounter (Signed)
Mailed patient calendar of upcoming March appointments per 2/14 sch message

## 2017-03-16 NOTE — H&P (Signed)
Melody Zhang Location: [facility] Patient #: [id number] DOB: 05/03/1955 Undefined / Language: Cleophus Molt / Race: Black or African American Female        History of Present Illness     This is a 62 year old female, referred by Dr. Curlene Dolphin at the [facility] for evaluation of a new invasive cancer left breast, upper inner quadrant. She is seen in the [facility] today by Dr. Lindi Adie, Dr. Lisbeth Renshaw, and me. Her PCP is Dr. Maury Dus; [facility] at [place]. Multiple family members are with her today  No prior breast problems. Last mammogram was 10 years ago. She says Dr. Alyson Ingles talked her into getting mammograms. Imaging studies showed category B density. There was a solitary 1.9 cm mass in the left breast, upper inner quadrant, middle third, 11:30 position, 1 cm from the nipple. Ultrasound of the axilla is negative. Image guided biopsy shows grade 2 invasive ductal carcinoma, ER 100%, PR 20%, Ki-67 25%, HER-2/neu negative.  Past history is significant for hypertension, hyperlipidemia, non-insulin-dependent diabetes. TAH without BSO. Family history reveals mother died of myeloma. Father died of heart disease. She has a second cousin with breast cancer. No ovarian cancer Social history reveals she is divorced. 3 children. 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 Denies alcohol or tobacco  We had a long discussion about surgical management. We talked about lumpectomy, sentinel node biopsy, mastectomy with or without reconstruction. She is motivated for breast conservation and I think she is a good candidate for that. She will be scheduled for left breast lumpectomy with radioactive seed localization, injection blue dye left breast, left axillary deep sentinel lymph node biopsy. I have discussed the indications, details, techniques, and numerous risk of the surgery with her. She is aware of the risk of bleeding, infection, nerve damage with chronic pain, cosmetic deformity, reoperation  for positive margins or positive nodes, arm swelling, arm numbness. She understands all these issues. All her questions were answered. She agrees with this plan      Past Surgical History  Breast Biopsy  Left. Hysterectomy (not due to cancer) - Partial  Oral Surgery  Ventral / Umbilical Hernia Surgery  Bilateral.  Diagnostic Studies History  Colonoscopy  5-10 years ago Mammogram  within last year  Medication History  Medications Reconciled  Social History  No alcohol use  No drug use  Tobacco use  Never smoker.  Family History  Alcohol Abuse  Family Members In General. Arthritis  Family Members In General. Cerebrovascular Accident  Family Members In General. Diabetes Mellitus  Brother, Family Members In General, Father. Heart Disease  Family Members In General, Father. Hypertension  Brother, Family Members In [place], Father, Sister. Kidney Disease  Family Members In General. Malignant Neoplasm Of Pancreas  Family Members In General. Melanoma  Mother.  Pregnancy / Birth History  Age at menarche  89 years. Age of menopause  40-50 Contraceptive History  Oral contraceptives. Gravida  3 Irregular periods  Maternal age  92-20 Para  3  Other Problems  Arthritis  Breast Cancer  Gastroesophageal Reflux Disease  Heart murmur  High blood pressure  Hypercholesterolemia  Lump In Breast  Sleep Apnea  Umbilical Hernia Repair     Review of Systems  General Not Present- Appetite Loss, Chills, Fatigue, Fever, Night Sweats, Weight Gain and Weight Loss. Skin Not Present- Change in Wart/Mole, Dryness, Hives, Jaundice, New Lesions, Non-Healing Wounds, Rash and Ulcer. HEENT Present- Wears glasses/contact lenses. Not Present- Earache, Hearing Loss, Hoarseness, Nose Bleed, Oral  Ulcers, Ringing in the Ears, Seasonal Allergies, Sinus Pain, Sore Throat, Visual Disturbances and Yellow Eyes. Respiratory Not Present- Bloody sputum, Chronic Cough,  Difficulty Breathing, Snoring and Wheezing. Breast Not Present- Breast Mass, Breast Pain, Nipple Discharge and Skin Changes. Cardiovascular Present- Palpitations and Rapid Heart Rate. Not Present- Chest Pain, Difficulty Breathing Lying Down, Leg Cramps, Shortness of Breath and Swelling of Extremities. Gastrointestinal Present- Constipation and Indigestion. Not Present- Abdominal Pain, Bloating, Bloody Stool, Change in Bowel Habits, Chronic diarrhea, Difficulty Swallowing, Excessive gas, Gets full quickly at meals, Hemorrhoids, Nausea, Rectal Pain and Vomiting. Female Genitourinary Present- Nocturia. Not Present- Frequency, Painful Urination, Pelvic Pain and Urgency. Musculoskeletal Present- Joint Pain. Not Present- Back Pain, Joint Stiffness, Muscle Pain, Muscle Weakness and Swelling of Extremities. Psychiatric Not Present- Anxiety, Bipolar, Change in Sleep Pattern, Depression, Fearful and Frequent crying. Endocrine Present- Hot flashes. Not Present- Cold Intolerance, Excessive Hunger, Hair Changes, Heat Intolerance and New Diabetes. Hematology Present- Blood Thinners. Not Present- Easy Bruising, Excessive bleeding, Gland problems, HIV and Persistent Infections.   Physical Exam  General Mental Status-Alert. General Appearance-Consistent with stated age. Hydration-Well hydrated. Voice-Normal. Note: Obesity   Head and Neck Head-normocephalic, atraumatic with no lesions or palpable masses. Trachea-midline. Thyroid Gland Characteristics - normal size and consistency.  Eye Eyeball - Bilateral-Extraocular movements intact. Sclera/Conjunctiva - Bilateral-No scleral icterus.  Chest and Lung Exam Chest and lung exam reveals -quiet, even and easy respiratory effort with no use of accessory muscles and on auscultation, normal breath sounds, no adventitious sounds and normal vocal resonance. Inspection Chest Wall - Normal. Back - normal.  Breast Note: Breasts are very  large. I can feel a small lump in the left breast upper inner quadrant outside the areolar margin. There is no other mass in either breast. There is no axillary adenopathy on either side   Cardiovascular Cardiovascular examination reveals -normal heart sounds, regular rate and rhythm with no murmurs and normal pedal pulses bilaterally.  Abdomen Inspection Inspection of the abdomen reveals - No Hernias. Skin - Scar - Note: Lower midline scar from umbilicus to symphysis pubis. Palpation/Percussion Palpation and Percussion of the abdomen reveal - Soft, Non Tender, No Rebound tenderness, No Rigidity (guarding) and No hepatosplenomegaly. Auscultation Auscultation of the abdomen reveals - Bowel sounds normal.  Neurologic Neurologic evaluation reveals -alert and oriented x 3 with no impairment of recent or remote memory. Mental Status-Normal.  Musculoskeletal Normal Exam - Left-Upper Extremity Strength Normal and Lower Extremity Strength Normal. Normal Exam - Right-Upper Extremity Strength Normal and Lower Extremity Strength Normal.  Lymphatic Head & Neck  General Head & Neck Lymphatics: Bilateral - Description - Normal. Axillary  General Axillary Region: Bilateral - Description - Normal. Tenderness - Non Tender. Femoral & Inguinal  Generalized Femoral & Inguinal Lymphatics: Bilateral - Description - Normal. Tenderness - Non Tender.    Assessment & Plan  PRIMARY CANCER OF UPPER INNER QUADRANT OF LEFT FEMALE BREAST (C50.212)  Your recent imaging studies and biopsies showed that she would have a small, 1.9 cm cancer in the left breast, upper inner quadrant at the 11:30 position this cancer is positive for estrogen receptor and negative for HER-2/neu. This seems to be a solitary finding. The right breast looks normal.  We have discussed options for surgical intervention including lumpectomy, sentinel lymph node biopsy, and mastectomy with or without reconstruction You  have expressed an interest in breast conservation and I think you are an excellent candidate for that  you'll be scheduled for left breast lumpectomy with radioactive  seed localization and left axillary sentinel lymph node biopsy We have discussed the indications, techniques, and risks of the surgery in detail Dr. Darrel Hoover office will call you in the next 24-48 hours to set up the surgery and the radioactive seed placement  Decisions about chemotherapy and radiation therapy will be made after the surgery is completed and the final pathology is reviewed by all of the physicians  HYPERTENSION, ESSENTIAL (I10) HISTORY OF TOTAL ABDOMINAL HYSTERECTOMY (Z90.710) Impression: Ovaries were not removed TYPE 2 DIABETES MELLITUS TREATED WITHOUT INSULIN (E11.9) OBESITY, MORBID (E66.01)    Haywood M. Dalbert Batman, M.D., [facility] General and Minimally invasive Surgery Breast and Colorectal Surgery Office:   [phone] Pager:   [phone]

## 2017-03-17 NOTE — Pre-Procedure Instructions (Signed)
Melody Zhang  03/17/2017      [place], [place] - [address] [place] [address] Phone: [phone] Fax: [phone]    Your procedure is scheduled on Friday February 22.  Report to [facility] Admitting at 10:30 A.M.  Call this number if you have problems the morning of surgery:  [phone]   Remember:  Do not eat food or drink liquids after midnight.  **Drink Ensure Pre-surgery drink prior to leaving home the morning of surgery**   Take these medicines the morning of surgery with A SIP OF WATER: NONE  CONTINUE taking all medications as prescribed. Follow the instructions below.   DO NOT take metformin (Glucophage) the day of surgery.   7 days prior to surgery STOP taking any Aleve, Naproxen, Ibuprofen, Motrin, Advil, Goody's, BC's, all herbal medications, fish oil, and all vitamins  **Follow your surgeon's instructions about stopping Aspirin. If no instructions were given, please call your surgeon's office.**       How to Manage Your Diabetes Before and After Surgery  Why is it important to control my blood sugar before and after surgery? . Improving blood sugar levels before and after surgery helps healing and can limit problems. . A way of improving blood sugar control is eating a healthy diet by: o  Eating less sugar and carbohydrates o  Increasing activity/exercise o  Talking with your doctor about reaching your blood sugar goals . High blood sugars (greater than 180 mg/dL) can raise your risk of infections and slow your recovery, so you will need to focus on controlling your diabetes during the weeks before surgery. . Make sure that the doctor who takes care of your diabetes knows about your planned surgery including the date and location.  How do I manage my blood sugar before surgery? . Check your blood sugar at least 4 times a day, starting 2 days before surgery, to make  sure that the level is not too high or low. o Check your blood sugar the morning of your surgery when you wake up and every 2 hours until you get to the Short Stay unit. . If your blood sugar is less than 70 mg/dL, you will need to treat for low blood sugar: o Do not take insulin. o Treat a low blood sugar (less than 70 mg/dL) with  cup of clear juice (cranberry or apple), 4 glucose tablets, OR glucose gel. Recheck blood sugar in 15 minutes after treatment (to make sure it is greater than 70 mg/dL). If your blood sugar is not greater than 70 mg/dL on recheck, call [phone] o  for further instructions. . Report your blood sugar to the short stay nurse when you get to Short Stay.  . If you are admitted to the hospital after surgery: o Your blood sugar will be checked by the staff and you will probably be given insulin after surgery (instead of oral diabetes medicines) to make sure you have good blood sugar levels. o The goal for blood sugar control after surgery is 80-180 mg/dL.              Do not wear jewelry, make-up or nail polish.  Do not wear lotions, powders, or perfumes, or deodorant.  Do not shave 48 hours prior to surgery.  Men may shave face and neck.  Do not bring valuables to the hospital.  [facility] is not responsible for any belongings or valuables.  Contacts, dentures or bridgework may not be worn into surgery.  Leave your suitcase in the car.  After surgery it may be brought to your room.  For patients admitted to the hospital, discharge time will be determined by your treatment team.  Patients discharged the day of surgery will not be allowed to drive home.    Special instructions:    [place]- Preparing For Surgery  Before surgery, you can play an important role. Because skin is not sterile, your skin needs to be as free of germs as possible. You can reduce the number of germs on your skin by washing with CHG (chlorahexidine gluconate) Soap before  surgery.  CHG is an antiseptic cleaner which kills germs and bonds with the skin to continue killing germs even after washing.  Please do not use if you have an allergy to CHG or antibacterial soaps. If your skin becomes reddened/irritated stop using the CHG.  Do not shave (including legs and underarms) for at least 48 hours prior to first CHG shower. It is OK to shave your face.  Please follow these instructions carefully.   1. Shower the NIGHT BEFORE SURGERY and the MORNING OF SURGERY with CHG.   2. If you chose to wash your hair, wash your hair first as usual with your normal shampoo.  3. After you shampoo, rinse your hair and body thoroughly to remove the shampoo.  4. Use CHG as you would any other liquid soap. You can apply CHG directly to the skin and wash gently with a scrungie or a clean washcloth.   5. Apply the CHG Soap to your body ONLY FROM THE NECK DOWN.  Do not use on open wounds or open sores. Avoid contact with your eyes, ears, mouth and genitals (private parts). Wash Face and genitals (private parts)  with your normal soap.  6. Wash thoroughly, paying special attention to the area where your surgery will be performed.  7. Thoroughly rinse your body with warm water from the neck down.  8. DO NOT shower/wash with your normal soap after using and rinsing off the CHG Soap.  9. Pat yourself dry with a CLEAN TOWEL.  10. Wear CLEAN PAJAMAS to bed the night before surgery, wear comfortable clothes the morning of surgery  11. Place CLEAN SHEETS on your bed the night of your first shower and DO NOT SLEEP WITH PETS.    Day of Surgery: Do not apply any deodorants/lotions. Please wear clean clothes to the hospital/surgery center.      Please read over the following fact sheets that you were given. Coughing and Deep Breathing and Surgical Site Infection Prevention

## 2017-03-18 ENCOUNTER — Encounter (HOSPITAL_COMMUNITY): Payer: Self-pay

## 2017-03-18 ENCOUNTER — Encounter: Payer: Self-pay | Admitting: Genetic Counselor

## 2017-03-18 ENCOUNTER — Other Ambulatory Visit: Payer: Self-pay

## 2017-03-18 ENCOUNTER — Encounter (HOSPITAL_COMMUNITY)
Admission: RE | Admit: 2017-03-18 | Discharge: 2017-03-18 | Disposition: A | Discharge status: Home / Self Care | Payer: 59 | Source: Ambulatory Visit | Attending: General Surgery | Admitting: General Surgery

## 2017-03-18 DIAGNOSIS — Z7984 Long term (current) use of oral hypoglycemic drugs: Secondary | ICD-10-CM | POA: Diagnosis not present

## 2017-03-18 DIAGNOSIS — E785 Hyperlipidemia, unspecified: Secondary | ICD-10-CM | POA: Diagnosis not present

## 2017-03-18 DIAGNOSIS — Z79899 Other long term (current) drug therapy: Secondary | ICD-10-CM | POA: Diagnosis not present

## 2017-03-18 DIAGNOSIS — K219 Gastro-esophageal reflux disease without esophagitis: Secondary | ICD-10-CM | POA: Diagnosis not present

## 2017-03-18 DIAGNOSIS — Z6841 Body Mass Index (BMI) 40.0 and over, adult: Secondary | ICD-10-CM | POA: Diagnosis not present

## 2017-03-18 DIAGNOSIS — N6082 Other benign mammary dysplasias of left breast: Secondary | ICD-10-CM | POA: Diagnosis not present

## 2017-03-18 DIAGNOSIS — E119 Type 2 diabetes mellitus without complications: Secondary | ICD-10-CM | POA: Diagnosis not present

## 2017-03-18 DIAGNOSIS — G4730 Sleep apnea, unspecified: Secondary | ICD-10-CM | POA: Diagnosis not present

## 2017-03-18 DIAGNOSIS — Z791 Long term (current) use of non-steroidal anti-inflammatories (NSAID): Secondary | ICD-10-CM | POA: Diagnosis not present

## 2017-03-18 DIAGNOSIS — Z1379 Encounter for other screening for genetic and chromosomal anomalies: Secondary | ICD-10-CM | POA: Insufficient documentation

## 2017-03-18 DIAGNOSIS — Z803 Family history of malignant neoplasm of breast: Secondary | ICD-10-CM | POA: Diagnosis not present

## 2017-03-18 DIAGNOSIS — Z170 Estrogen receptor positive status [ER+]: Secondary | ICD-10-CM | POA: Diagnosis not present

## 2017-03-18 DIAGNOSIS — D242 Benign neoplasm of left breast: Secondary | ICD-10-CM | POA: Diagnosis not present

## 2017-03-18 DIAGNOSIS — Z90710 Acquired absence of both cervix and uterus: Secondary | ICD-10-CM | POA: Diagnosis not present

## 2017-03-18 DIAGNOSIS — I10 Essential (primary) hypertension: Secondary | ICD-10-CM | POA: Diagnosis not present

## 2017-03-18 DIAGNOSIS — Z7982 Long term (current) use of aspirin: Secondary | ICD-10-CM | POA: Diagnosis not present

## 2017-03-18 DIAGNOSIS — C50212 Malignant neoplasm of upper-inner quadrant of left female breast: Secondary | ICD-10-CM | POA: Diagnosis not present

## 2017-03-18 HISTORY — DX: Cardiac murmur, unspecified: R01.1

## 2017-03-18 HISTORY — DX: Malignant (primary) neoplasm, unspecified: C80.1

## 2017-03-18 HISTORY — DX: Palpitations: R00.2

## 2017-03-18 HISTORY — DX: Gastro-esophageal reflux disease without esophagitis: K21.9

## 2017-03-18 HISTORY — DX: Family history of other specified conditions: Z84.89

## 2017-03-18 HISTORY — DX: Sleep apnea, unspecified: G47.30

## 2017-03-18 LAB — CBC
HCT: 38.1 % (ref 36.0–46.0)
Hemoglobin: 12 g/dL (ref 12.0–15.0)
MCH: 25.8 pg — ABNORMAL LOW (ref 26.0–34.0)
MCHC: 31.5 g/dL (ref 30.0–36.0)
MCV: 81.8 fL (ref 78.0–100.0)
Platelets: 280 10*3/uL (ref 150–400)
RBC: 4.66 MIL/uL (ref 3.87–5.11)
RDW: 15.7 % — ABNORMAL HIGH (ref 11.5–15.5)
WBC: 7.4 10*3/uL (ref 4.0–10.5)

## 2017-03-18 LAB — BASIC METABOLIC PANEL
Anion gap: 12 (ref 5–15)
BUN: 13 mg/dL (ref 6–20)
CO2: 26 mmol/L (ref 22–32)
Calcium: 9.7 mg/dL (ref 8.9–10.3)
Chloride: 102 mmol/L (ref 101–111)
Creatinine, Ser: 0.93 mg/dL (ref 0.44–1.00)
GFR calc Af Amer: >60 mL/min (ref >60)
GFR calc non Af Amer: >60 mL/min (ref >60)
Glucose, Bld: 99 mg/dL (ref 65–99)
Potassium: 3.5 mmol/L (ref 3.5–5.1)
Sodium: 140 mmol/L (ref 135–145)

## 2017-03-18 LAB — HEMOGLOBIN A1C
Hgb A1c MFr Bld: 6.7 % — ABNORMAL HIGH (ref 4.8–5.6)
Mean Plasma Glucose: 145.59 mg/dL

## 2017-03-18 LAB — GLUCOSE, CAPILLARY: Glucose-Capillary: 97 mg/dL (ref 65–99)

## 2017-03-18 NOTE — Progress Notes (Signed)
PCP - Dr. Alyson Ingles Cardiologist - Dr. Curt Bears seen in the past, PRN follow up per patient currently. Pt with hx palpitations, currently controlled on diltiazem. Pt reports she is at her normal per Dr. Curt Bears, where she experiences a palpitation once or twice a month.   EKG - 03/18/2017  Stress Test - 08/25/15 ECHO - 08/21/15  Sleep Study - pt reports she wears a CPAP but is having repeat sleep study 04/14/17  Fasting Blood Sugar - pt does not check CBG at home. A1c checked today. Last A1c in December 6.4  Aspirin Instructions: pt states she has not been taking Aspirin in a long time. She stopped taking this when she had her diagnosis of breast cancer.    Patient denies shortness of breath, fever, cough and chest pain at PAT appointment   Patient verbalized understanding of instructions that were given to them at the PAT appointment. Patient was also instructed that they will need to review over the PAT instructions again at home before surgery.

## 2017-03-20 ENCOUNTER — Ambulatory Visit
Admission: RE | Admit: 2017-03-20 | Discharge: 2017-03-20 | Disposition: A | Discharge status: Home / Self Care | Payer: 59 | Source: Ambulatory Visit | Attending: General Surgery | Admitting: General Surgery

## 2017-03-20 DIAGNOSIS — Z170 Estrogen receptor positive status [ER+]: Secondary | ICD-10-CM

## 2017-03-20 DIAGNOSIS — C50912 Malignant neoplasm of unspecified site of left female breast: Secondary | ICD-10-CM | POA: Diagnosis not present

## 2017-03-20 DIAGNOSIS — C50212 Malignant neoplasm of upper-inner quadrant of left female breast: Secondary | ICD-10-CM

## 2017-03-20 MED ORDER — DEXTROSE 5 % IV SOLN
3.0000 g | INTRAVENOUS | Status: DC
  Filled 2017-03-20: qty 3000

## 2017-03-21 ENCOUNTER — Ambulatory Visit (HOSPITAL_COMMUNITY): Payer: 59 | Admitting: Anesthesiology

## 2017-03-21 ENCOUNTER — Ambulatory Visit (HOSPITAL_COMMUNITY)
Admission: RE | Admit: 2017-03-21 | Discharge: 2017-03-21 | Disposition: A | Discharge status: Home / Self Care | Payer: 59 | Source: Ambulatory Visit | Attending: General Surgery | Admitting: General Surgery

## 2017-03-21 ENCOUNTER — Ambulatory Visit
Admission: RE | Admit: 2017-03-21 | Discharge: 2017-03-21 | Disposition: A | Discharge status: Home / Self Care | Payer: 59 | Source: Ambulatory Visit | Attending: General Surgery | Admitting: General Surgery

## 2017-03-21 ENCOUNTER — Encounter (HOSPITAL_BASED_OUTPATIENT_CLINIC_OR_DEPARTMENT_OTHER): Payer: 59

## 2017-03-21 ENCOUNTER — Encounter (HOSPITAL_COMMUNITY): Payer: Self-pay | Admitting: *Deleted

## 2017-03-21 ENCOUNTER — Encounter (HOSPITAL_COMMUNITY)
Admission: RE | Admit: 2017-03-21 | Discharge: 2017-03-21 | Disposition: A | Discharge status: Home / Self Care | Payer: 59 | Source: Ambulatory Visit | Attending: General Surgery | Admitting: General Surgery

## 2017-03-21 ENCOUNTER — Encounter (HOSPITAL_COMMUNITY)
Admission: RE | Disposition: A | Discharge status: Home / Self Care | Payer: Self-pay | Source: Ambulatory Visit | Attending: General Surgery

## 2017-03-21 DIAGNOSIS — E119 Type 2 diabetes mellitus without complications: Secondary | ICD-10-CM | POA: Diagnosis not present

## 2017-03-21 DIAGNOSIS — Z7982 Long term (current) use of aspirin: Secondary | ICD-10-CM | POA: Insufficient documentation

## 2017-03-21 DIAGNOSIS — G4730 Sleep apnea, unspecified: Secondary | ICD-10-CM | POA: Insufficient documentation

## 2017-03-21 DIAGNOSIS — Z7984 Long term (current) use of oral hypoglycemic drugs: Secondary | ICD-10-CM | POA: Insufficient documentation

## 2017-03-21 DIAGNOSIS — D242 Benign neoplasm of left breast: Secondary | ICD-10-CM | POA: Diagnosis not present

## 2017-03-21 DIAGNOSIS — Z79899 Other long term (current) drug therapy: Secondary | ICD-10-CM | POA: Insufficient documentation

## 2017-03-21 DIAGNOSIS — E785 Hyperlipidemia, unspecified: Secondary | ICD-10-CM | POA: Diagnosis not present

## 2017-03-21 DIAGNOSIS — K219 Gastro-esophageal reflux disease without esophagitis: Secondary | ICD-10-CM | POA: Insufficient documentation

## 2017-03-21 DIAGNOSIS — Z170 Estrogen receptor positive status [ER+]: Secondary | ICD-10-CM | POA: Diagnosis not present

## 2017-03-21 DIAGNOSIS — C50912 Malignant neoplasm of unspecified site of left female breast: Secondary | ICD-10-CM | POA: Diagnosis not present

## 2017-03-21 DIAGNOSIS — G8918 Other acute postprocedural pain: Secondary | ICD-10-CM | POA: Diagnosis not present

## 2017-03-21 DIAGNOSIS — C50212 Malignant neoplasm of upper-inner quadrant of left female breast: Secondary | ICD-10-CM

## 2017-03-21 DIAGNOSIS — Z6841 Body Mass Index (BMI) 40.0 and over, adult: Secondary | ICD-10-CM | POA: Insufficient documentation

## 2017-03-21 DIAGNOSIS — N6082 Other benign mammary dysplasias of left breast: Secondary | ICD-10-CM | POA: Diagnosis not present

## 2017-03-21 DIAGNOSIS — Z90710 Acquired absence of both cervix and uterus: Secondary | ICD-10-CM | POA: Insufficient documentation

## 2017-03-21 DIAGNOSIS — Z791 Long term (current) use of non-steroidal anti-inflammatories (NSAID): Secondary | ICD-10-CM | POA: Insufficient documentation

## 2017-03-21 DIAGNOSIS — I10 Essential (primary) hypertension: Secondary | ICD-10-CM | POA: Insufficient documentation

## 2017-03-21 DIAGNOSIS — R928 Other abnormal and inconclusive findings on diagnostic imaging of breast: Secondary | ICD-10-CM | POA: Diagnosis not present

## 2017-03-21 DIAGNOSIS — N6012 Diffuse cystic mastopathy of left breast: Secondary | ICD-10-CM | POA: Diagnosis not present

## 2017-03-21 DIAGNOSIS — Z803 Family history of malignant neoplasm of breast: Secondary | ICD-10-CM | POA: Insufficient documentation

## 2017-03-21 HISTORY — PX: BREAST LUMPECTOMY WITH RADIOACTIVE SEED AND SENTINEL LYMPH NODE BIOPSY: SHX6550

## 2017-03-21 HISTORY — PX: BREAST LUMPECTOMY: SHX2

## 2017-03-21 LAB — GLUCOSE, CAPILLARY
Glucose-Capillary: 188 mg/dL — ABNORMAL HIGH (ref 65–99)
Glucose-Capillary: 111 mg/dL — ABNORMAL HIGH (ref 65–99)

## 2017-03-21 SURGERY — BREAST LUMPECTOMY WITH RADIOACTIVE SEED AND SENTINEL LYMPH NODE BIOPSY
Anesthesia: General | Site: Breast | Laterality: Left

## 2017-03-21 MED ORDER — DEXTROSE 5 % IV SOLN
INTRAVENOUS | Status: DC | PRN
  Administered 2017-03-21: 50 ug/min via INTRAVENOUS

## 2017-03-21 MED ORDER — GABAPENTIN 300 MG PO CAPS
ORAL_CAPSULE | ORAL | Status: AC
  Administered 2017-03-21: 300 mg via ORAL
  Filled 2017-03-21: qty 1

## 2017-03-21 MED ORDER — SODIUM CHLORIDE 0.9% FLUSH: 3.0000 mL | INTRAVENOUS | Status: DC | PRN

## 2017-03-21 MED ORDER — ACETAMINOPHEN 325 MG PO TABS: 650.0000 mg | ORAL_TABLET | ORAL | Status: DC | PRN

## 2017-03-21 MED ORDER — KETAMINE HCL-SODIUM CHLORIDE 100-0.9 MG/10ML-% IV SOSY
PREFILLED_SYRINGE | INTRAVENOUS | Status: AC
  Filled 2017-03-21: qty 10

## 2017-03-21 MED ORDER — LIDOCAINE 2% (20 MG/ML) 5 ML SYRINGE
INTRAMUSCULAR | Status: DC | PRN
  Administered 2017-03-21: 60 mg via INTRAVENOUS

## 2017-03-21 MED ORDER — PROPOFOL 10 MG/ML IV BOLUS
INTRAVENOUS | Status: AC
  Filled 2017-03-21: qty 20

## 2017-03-21 MED ORDER — LACTATED RINGERS IV SOLN
INTRAVENOUS | Status: DC
Start: 2017-03-21 — End: 2017-03-21

## 2017-03-21 MED ORDER — CELECOXIB 200 MG PO CAPS
ORAL_CAPSULE | ORAL | Status: AC
  Filled 2017-03-21: qty 1

## 2017-03-21 MED ORDER — METHYLENE BLUE 0.5 % INJ SOLN
INTRAVENOUS | Status: AC
  Filled 2017-03-21: qty 10

## 2017-03-21 MED ORDER — OXYCODONE HCL 5 MG PO TABS: 5.0000 mg | ORAL_TABLET | ORAL | Status: DC | PRN

## 2017-03-21 MED ORDER — CHLORHEXIDINE GLUCONATE CLOTH 2 % EX PADS: 6.0000 | MEDICATED_PAD | Freq: Once | CUTANEOUS | Status: DC

## 2017-03-21 MED ORDER — BUPIVACAINE-EPINEPHRINE (PF) 0.5% -1:200000 IJ SOLN
INTRAMUSCULAR | Status: AC
  Filled 2017-03-21: qty 30

## 2017-03-21 MED ORDER — DEXTROSE 5 % IV SOLN
INTRAVENOUS | Status: DC | PRN
  Administered 2017-03-21: 3 g via INTRAVENOUS

## 2017-03-21 MED ORDER — FENTANYL CITRATE (PF) 100 MCG/2ML IJ SOLN
INTRAMUSCULAR | Status: AC
  Filled 2017-03-21: qty 2

## 2017-03-21 MED ORDER — HYDROCODONE-ACETAMINOPHEN 5-325 MG PO TABS: 1.0000 | ORAL_TABLET | Freq: Four times a day (QID) | ORAL | 0 refills | Status: DC | PRN

## 2017-03-21 MED ORDER — SODIUM CHLORIDE 0.9 % IV SOLN: 250.0000 mL | INTRAVENOUS | Status: DC | PRN

## 2017-03-21 MED ORDER — PROPOFOL 10 MG/ML IV BOLUS
INTRAVENOUS | Status: DC | PRN
  Administered 2017-03-21: 200 mg via INTRAVENOUS

## 2017-03-21 MED ORDER — 0.9 % SODIUM CHLORIDE (POUR BTL) OPTIME
TOPICAL | Status: DC | PRN
  Administered 2017-03-21: 1000 mL

## 2017-03-21 MED ORDER — ACETAMINOPHEN 650 MG RE SUPP: 650.0000 mg | RECTAL | Status: DC | PRN

## 2017-03-21 MED ORDER — FENTANYL CITRATE (PF) 100 MCG/2ML IJ SOLN
INTRAMUSCULAR | Status: AC
  Administered 2017-03-21: 100 ug via INTRAVENOUS
  Filled 2017-03-21: qty 2

## 2017-03-21 MED ORDER — MIDAZOLAM HCL 2 MG/2ML IJ SOLN
INTRAMUSCULAR | Status: AC
  Administered 2017-03-21: 2 mg via INTRAVENOUS
  Filled 2017-03-21: qty 2

## 2017-03-21 MED ORDER — SODIUM CHLORIDE 0.9% FLUSH: 3.0000 mL | Freq: Two times a day (BID) | INTRAVENOUS | Status: DC

## 2017-03-21 MED ORDER — TECHNETIUM TC 99M SULFUR COLLOID FILTERED: 1.0000 | Freq: Once | INTRAVENOUS | Status: DC | PRN

## 2017-03-21 MED ORDER — FENTANYL CITRATE (PF) 100 MCG/2ML IJ SOLN
25.0000 ug | INTRAMUSCULAR | Status: DC | PRN
  Administered 2017-03-21 (×2): 50 ug via INTRAVENOUS

## 2017-03-21 MED ORDER — MIDAZOLAM HCL 2 MG/2ML IJ SOLN
INTRAMUSCULAR | Status: AC
  Filled 2017-03-21: qty 2

## 2017-03-21 MED ORDER — GABAPENTIN 300 MG PO CAPS
300.0000 mg | ORAL_CAPSULE | ORAL | Status: AC
  Administered 2017-03-21: 300 mg via ORAL

## 2017-03-21 MED ORDER — FENTANYL CITRATE (PF) 100 MCG/2ML IJ SOLN: 25.0000 ug | INTRAMUSCULAR | Status: DC | PRN

## 2017-03-21 MED ORDER — ONDANSETRON HCL 4 MG/2ML IJ SOLN
INTRAMUSCULAR | Status: DC | PRN
  Administered 2017-03-21: 4 mg via INTRAVENOUS

## 2017-03-21 MED ORDER — CELECOXIB 200 MG PO CAPS
200.0000 mg | ORAL_CAPSULE | ORAL | Status: AC
  Administered 2017-03-21: 200 mg via ORAL

## 2017-03-21 MED ORDER — ACETAMINOPHEN 500 MG PO TABS
ORAL_TABLET | ORAL | Status: AC
  Filled 2017-03-21: qty 2

## 2017-03-21 MED ORDER — PHENYLEPHRINE 40 MCG/ML (10ML) SYRINGE FOR IV PUSH (FOR BLOOD PRESSURE SUPPORT)
PREFILLED_SYRINGE | INTRAVENOUS | Status: DC | PRN
  Administered 2017-03-21: 200 ug via INTRAVENOUS
  Administered 2017-03-21: 120 ug via INTRAVENOUS
  Administered 2017-03-21: 200 ug via INTRAVENOUS

## 2017-03-21 MED ORDER — PHENYLEPHRINE 40 MCG/ML (10ML) SYRINGE FOR IV PUSH (FOR BLOOD PRESSURE SUPPORT)
PREFILLED_SYRINGE | INTRAVENOUS | Status: AC
  Filled 2017-03-21: qty 10

## 2017-03-21 MED ORDER — BUPIVACAINE-EPINEPHRINE (PF) 0.5% -1:200000 IJ SOLN
INTRAMUSCULAR | Status: DC | PRN
  Administered 2017-03-21 (×2): 10 mL

## 2017-03-21 MED ORDER — SUCCINYLCHOLINE CHLORIDE 200 MG/10ML IV SOSY
PREFILLED_SYRINGE | INTRAVENOUS | Status: AC
  Filled 2017-03-21: qty 10

## 2017-03-21 MED ORDER — LIDOCAINE 2% (20 MG/ML) 5 ML SYRINGE
INTRAMUSCULAR | Status: AC
  Filled 2017-03-21: qty 5

## 2017-03-21 MED ORDER — KETAMINE HCL 10 MG/ML IJ SOLN
INTRAMUSCULAR | Status: DC | PRN
  Administered 2017-03-21: 60 mg via INTRAVENOUS

## 2017-03-21 MED ORDER — SODIUM CHLORIDE 0.9 % IJ SOLN
INTRAMUSCULAR | Status: AC
  Filled 2017-03-21: qty 10

## 2017-03-21 MED ORDER — PHENYLEPHRINE 40 MCG/ML (10ML) SYRINGE FOR IV PUSH (FOR BLOOD PRESSURE SUPPORT)
PREFILLED_SYRINGE | INTRAVENOUS | Status: AC
  Filled 2017-03-21: qty 20

## 2017-03-21 MED ORDER — FENTANYL CITRATE (PF) 100 MCG/2ML IJ SOLN
100.0000 ug | Freq: Once | INTRAMUSCULAR | Status: AC
  Administered 2017-03-21: 100 ug via INTRAVENOUS

## 2017-03-21 MED ORDER — ONDANSETRON HCL 4 MG/2ML IJ SOLN
INTRAMUSCULAR | Status: AC
  Filled 2017-03-21: qty 2

## 2017-03-21 MED ORDER — MIDAZOLAM HCL 2 MG/2ML IJ SOLN
2.0000 mg | Freq: Once | INTRAMUSCULAR | Status: AC
  Administered 2017-03-21: 2 mg via INTRAVENOUS

## 2017-03-21 MED ORDER — DEXAMETHASONE SODIUM PHOSPHATE 10 MG/ML IJ SOLN
INTRAMUSCULAR | Status: AC
  Filled 2017-03-21: qty 1

## 2017-03-21 MED ORDER — FENTANYL CITRATE (PF) 250 MCG/5ML IJ SOLN
INTRAMUSCULAR | Status: AC
  Filled 2017-03-21: qty 5

## 2017-03-21 MED ORDER — MIDAZOLAM HCL 5 MG/5ML IJ SOLN
INTRAMUSCULAR | Status: DC | PRN
  Administered 2017-03-21: 2 mg via INTRAVENOUS

## 2017-03-21 MED ORDER — DEXAMETHASONE SODIUM PHOSPHATE 10 MG/ML IJ SOLN
INTRAMUSCULAR | Status: DC | PRN
  Administered 2017-03-21: 5 mg via INTRAVENOUS

## 2017-03-21 MED ORDER — SUCCINYLCHOLINE CHLORIDE 200 MG/10ML IV SOSY
PREFILLED_SYRINGE | INTRAVENOUS | Status: DC | PRN
  Administered 2017-03-21: 120 mg via INTRAVENOUS

## 2017-03-21 MED ORDER — SODIUM CHLORIDE 0.9 % IJ SOLN
INTRAVENOUS | Status: DC | PRN
  Administered 2017-03-21: 5 mL via INTRAMUSCULAR

## 2017-03-21 MED ORDER — ACETAMINOPHEN 500 MG PO TABS
1000.0000 mg | ORAL_TABLET | ORAL | Status: AC
  Administered 2017-03-21: 1000 mg via ORAL

## 2017-03-21 MED ORDER — LACTATED RINGERS IV SOLN
INTRAVENOUS | Status: DC | PRN
  Administered 2017-03-21 (×2): via INTRAVENOUS

## 2017-03-21 MED FILL — HYDROCODON-APAP 5-325: 5-325 | 3 days supply | Qty: 20 | Fill #0

## 2017-03-21 SURGICAL SUPPLY — 55 items
ADH SKN CLS APL DERMABOND .7 (GAUZE/BANDAGES/DRESSINGS) ×1
APPLIER CLIP 9.375 MED OPEN (MISCELLANEOUS) ×2
APR CLP MED 9.3 20 MLT OPN (MISCELLANEOUS) ×1
BINDER BREAST 3XL (GAUZE/BANDAGES/DRESSINGS) ×1 IMPLANT
BLADE SURG 15 STRL LF DISP TIS (BLADE) ×2 IMPLANT
BLADE SURG 15 STRL SS (BLADE) ×4
CANISTER SUCT 3000ML PPV (MISCELLANEOUS) ×2 IMPLANT
CHLORAPREP W/TINT 26ML (MISCELLANEOUS) ×2 IMPLANT
CLIP APPLIE 9.375 MED OPEN (MISCELLANEOUS) ×1 IMPLANT
CONT SPEC 4OZ CLIKSEAL STRL BL (MISCELLANEOUS) ×8 IMPLANT
COVER PROBE W GEL 5X96 (DRAPES) ×2 IMPLANT
COVER SURGICAL LIGHT HANDLE (MISCELLANEOUS) ×2 IMPLANT
DERMABOND ADVANCED (GAUZE/BANDAGES/DRESSINGS) ×1
DERMABOND ADVANCED .7 DNX12 (GAUZE/BANDAGES/DRESSINGS) ×1 IMPLANT
DEVICE DUBIN SPECIMEN MAMMOGRA (MISCELLANEOUS) ×2 IMPLANT
DRAPE CHEST BREAST 15X10 FENES (DRAPES) ×2 IMPLANT
DRAPE HALF SHEET 40X57 (DRAPES) ×2 IMPLANT
DRAPE UTILITY XL STRL (DRAPES) ×2 IMPLANT
DRSG PAD ABDOMINAL 8X10 ST (GAUZE/BANDAGES/DRESSINGS) IMPLANT
ELECT CAUTERY BLADE 6.4 (BLADE) ×2 IMPLANT
ELECT REM PT RETURN 9FT ADLT (ELECTROSURGICAL) ×2
ELECTRODE REM PT RTRN 9FT ADLT (ELECTROSURGICAL) ×1 IMPLANT
GAUZE SPONGE 4X4 12PLY STRL (GAUZE/BANDAGES/DRESSINGS) ×1 IMPLANT
GLOVE BIOGEL PI IND STRL 6.5 (GLOVE) ×1 IMPLANT
GLOVE BIOGEL PI IND STRL 7.0 (GLOVE) ×1 IMPLANT
GLOVE BIOGEL PI INDICATOR 6.5 (GLOVE) ×1
GLOVE BIOGEL PI INDICATOR 7.0 (GLOVE) ×1
GLOVE ECLIPSE 6.5 STRL STRAW (GLOVE) ×1 IMPLANT
GLOVE EUDERMIC 7 POWDERFREE (GLOVE) ×3 IMPLANT
GLOVE SURG SS PI 6.0 STRL IVOR (GLOVE) ×2 IMPLANT
GOWN STRL REUS W/ TWL LRG LVL3 (GOWN DISPOSABLE) ×1 IMPLANT
GOWN STRL REUS W/ TWL XL LVL3 (GOWN DISPOSABLE) ×1 IMPLANT
GOWN STRL REUS W/TWL LRG LVL3 (GOWN DISPOSABLE) ×4
GOWN STRL REUS W/TWL XL LVL3 (GOWN DISPOSABLE) ×2
ILLUMINATOR WAVEGUIDE N/F (MISCELLANEOUS) IMPLANT
KIT BASIN OR (CUSTOM PROCEDURE TRAY) ×2 IMPLANT
KIT MARKER MARGIN INK (KITS) ×2 IMPLANT
KIT TURNOVER KIT B (KITS) ×1 IMPLANT
LIGHT WAVEGUIDE WIDE FLAT (MISCELLANEOUS) IMPLANT
NDL SAFETY ECLIPSE 18X1.5 (NEEDLE) IMPLANT
NEEDLE HYPO 18GX1.5 SHARP (NEEDLE) ×2
NEEDLE HYPO 25GX1X1/2 BEV (NEEDLE) ×2 IMPLANT
NS IRRIG 1000ML POUR BTL (IV SOLUTION) ×2 IMPLANT
PACK SURGICAL SETUP 50X90 (CUSTOM PROCEDURE TRAY) ×2 IMPLANT
PAD ABD 8X10 STRL (GAUZE/BANDAGES/DRESSINGS) ×1 IMPLANT
PENCIL BUTTON HOLSTER BLD 10FT (ELECTRODE) ×2 IMPLANT
SPONGE LAP 4X18 X RAY DECT (DISPOSABLE) ×4 IMPLANT
SUT MNCRL AB 4-0 PS2 18 (SUTURE) ×4 IMPLANT
SUT SILK 2 0 SH (SUTURE) ×2 IMPLANT
SUT VIC AB 3-0 SH 18 (SUTURE) ×3 IMPLANT
SYR BULB 3OZ (MISCELLANEOUS) ×2 IMPLANT
SYR CONTROL 10ML LL (SYRINGE) ×3 IMPLANT
TOWEL GREEN STERILE (TOWEL DISPOSABLE) ×2 IMPLANT
TUBE CONNECTING 12X1/4 (SUCTIONS) ×2 IMPLANT
YANKAUER SUCT BULB TIP NO VENT (SUCTIONS) ×2 IMPLANT

## 2017-03-21 NOTE — Anesthesia Procedure Notes (Signed)
Procedure Name: Intubation Date/Time: 03/21/2017 11:01 AM Performed by: Freddie Breech, CRNA Pre-anesthesia Checklist: Patient identified, Emergency Drugs available, Suction available and Patient being monitored Patient Re-evaluated:Patient Re-evaluated prior to induction Oxygen Delivery Method: Circle System Utilized Preoxygenation: Pre-oxygenation with 100% oxygen Induction Type: IV induction, Rapid sequence and Cricoid Pressure applied Laryngoscope Size: Mac and 4 Grade View: Grade I Tube type: Oral Tube size: 7.0 mm Number of attempts: 1 Airway Equipment and Method: Stylet,  Oral airway and LTA kit utilized Placement Confirmation: ETT inserted through vocal cords under direct vision,  positive ETCO2 and breath sounds checked- equal and bilateral Secured at: 21 cm Tube secured with: Tape Dental Injury: Teeth and Oropharynx as per pre-operative assessment

## 2017-03-21 NOTE — Interval H&P Note (Signed)
History and Physical Interval Note:  03/21/2017 9:36 AM  Melody Zhang  has presented today for surgery, with the diagnosis of cancer left breast  The various methods of treatment have been discussed with the patient and family. After consideration of risks, benefits and other options for treatment, the patient has consented to  Procedure(s): LEFT BREAST LUMPECTOMY WITH RADIOACTIVE SEED AND LEFT AXILLARY DEEP LYMPH NODE BIOPSY INJECTION BLUE DYE LEFT BREAST ERAS PATHWAY (Left) as a surgical intervention .  The patient's history has been reviewed, patient examined, no change in status, stable for surgery.  I have reviewed the patient's chart and labs.  Questions were answered to the patient's satisfaction.     Adin Hector

## 2017-03-21 NOTE — Anesthesia Postprocedure Evaluation (Signed)
Anesthesia Post Note  Patient: Melody Zhang  Procedure(s) Performed: LEFT BREAST LUMPECTOMY WITH RADIOACTIVE SEED AND LEFT AXILLARY DEEP LYMPH NODE BIOPSY INJECTION BLUE DYE LEFT BREAST ERAS PATHWAY (Left Breast)     Patient location during evaluation: PACU Anesthesia Type: General Level of consciousness: awake Pain management: pain level controlled Vital Signs Assessment: post-procedure vital signs reviewed and stable Respiratory status: spontaneous breathing Cardiovascular status: stable Anesthetic complications: no    Last Vitals:  Vitals:   03/21/17 1257 03/21/17 1300  BP: 138/76   Pulse: 74 70  Resp: 13 (!) 21  Temp:    SpO2: 98% 96%    Last Pain:  Vitals:   03/21/17 1257  TempSrc:   PainSc: 10-Worst pain ever                 CHARLENE GREEN

## 2017-03-21 NOTE — Op Note (Signed)
Patient Name:           Melody Zhang   Date of Surgery:        03/21/2017  Pre op Diagnosis:      Invasive cancer left breast, estrogen receptor positive, upper inner quadrant  Post op Diagnosis:    Same  Procedure:                 Inject blue dye left breast                                      Left breast lumpectomy with radioactive seed localization                                      Reexcision superior margin                                      Left axillary deep sentinel lymph node mapping and biopsy  Surgeon:                     Edsel Petrin. Dalbert Batman, M.D., FACS  Assistant:                   OR staff  Operative Indications:   This is a 62 year old female, referred by Dr. Curlene Dolphin at the [facility] for evaluation of a new invasive cancer left breast, upper inner quadrant. She was seen in the [facility] by Dr. Lindi Adie, Dr. Lisbeth Renshaw, and me. Her PCP is Dr. Maury Dus; [facility] at [place].       No prior breast problems. Last mammogram was 10 years ago. She says Dr. Alyson Ingles talked her into getting mammograms. Imaging studies showed category B density. There was a solitary 1.9 cm mass in the left breast, upper inner quadrant, middle third, 11:30 position, 1 cm from the nipple. Ultrasound of the axilla is negative. Image guided biopsy shows grade 2 invasive ductal carcinoma, ER 100%, PR 20%, Ki-67 25%, HER-2/neu negative.      Family history reveals mother died of myeloma. Father died of heart disease. She has a second cousin with breast cancer. No ovarian cancer     We had a long discussion about surgical management. We talked about lumpectomy, sentinel node biopsy, mastectomy with or without reconstruction. She is motivated for breast conservation and I think she is a good candidate for that. She will be scheduled for left breast lumpectomy with radioactive seed localization, injection blue dye left breast, left axillary deep sentinel lymph node biopsy.  She agrees with this  plan    Operative Findings:       The tumor containing the biopsy clip and radioactive seed was in the upper inner left breast about 6 cm away from the nipple.  The specimen mammogram looked good with the clip and seed in the relative center of the specimen.  There was somewhat harder tissue superiorly so I reexcised the superior margin.  I found 3 sentinel lymph nodes but they were not abnormal by gross exam  Procedure in Detail:          The patient underwent left pectoral block and injection of technetium radionuclide in the holding area.  She was taken to the operating room  and underwent general anesthesia with LMA device.  Surgical timeout was performed.  Intravenous antibiotics were given.  Following alcohol prep I injected 5 mL of dilute methylene blue into the left breast, subareolar area and massaged the breast for a few minutes.      The entire left breast chest wall axilla were then prepped and draped in a sterile fashion.  0.5% Marcaine with epinephrine was used as local infiltration anesthetic.  Using the neoprobe I  identified the location of the radioactive seed.  A curvilinear circumareolar incision was made in the upper inner left breast about 3  centimeters above the areolar margin.  Lumpectomy was performed using the neoprobe and electrocautery.  The specimen was removed and marked with silk sutures and a 6 color ink kit to orient the pathologist.  The specimen mammogram looked good as mentioned above.  I reexcised the superior margin, marked it with ink and sent it as a separate specimen.  The lumpectomy incision was irrigated.  Hemostasis was excellent.  5 metal clips were placed in the walls of the lumpectomy cavity.  The lumpectomy breast tissues were reapproximated in multiple layers with interrupted 3-0 Vicryl sutures and the skin closed with a running subcuticular 4-0 Monocryl and Dermabond.      A transverse incision was made in the left axilla at the hairline.  Dissection was  carried down through the clavipectoral fascia.  I dissected out 3 sentinel lymph nodes, all very hot and 2 of them contain a lot of blue dye.  There was no more radioactivity after this dissection.  Axilla was irrigated.  Hemostasis was excellent.  The clavipectoral fascia was closed with 3-0 Vicryl and the skin closed with a running subcuticular 4-0 Monocryl and Dermabond.  Dry bandages and a breast binder were placed.  The patient was taken to PACU in stable condition.  EBL 25 mL or less.  Counts correct.  Complications none.   Addendum: I logged onto the [employer] and reviewed her prescription medication history     Haywood M. Dalbert Batman, M.D., FACS General and Minimally Invasive Surgery Breast and Colorectal Surgery  03/21/2017 12:29 PM

## 2017-03-21 NOTE — Discharge Instructions (Signed)
[facility] °Office Phone Number [phone] ° °BREAST BIOPSY/ PARTIAL MASTECTOMY: POST OP INSTRUCTIONS ° °Always review your discharge instruction sheet given to you by the facility where your surgery was performed. ° °IF YOU HAVE DISABILITY OR FAMILY LEAVE FORMS, YOU MUST BRING THEM TO THE OFFICE FOR PROCESSING.  DO NOT GIVE THEM TO YOUR DOCTOR. ° °1. A prescription for pain medication may be given to you upon discharge.  Take your pain medication as prescribed, if needed.  If narcotic pain medicine is not needed, then you may take acetaminophen (Tylenol) or ibuprofen (Advil) as needed. °2. Take your usually prescribed medications unless otherwise directed °3. If you need a refill on your pain medication, please contact your pharmacy.  They will contact our office to request authorization.  Prescriptions will not be filled after 5pm or on week-ends. °4. You should eat very light the first 24 hours after surgery, such as soup, crackers, pudding, etc.  Resume your normal diet the day after surgery. °5. Most patients will experience some swelling and bruising in the breast.  Ice packs and a good support bra will help.  Swelling and bruising can take several days to resolve.  °6. It is common to experience some constipation if taking pain medication after surgery.  Increasing fluid intake and taking a stool softener will usually help or prevent this problem from occurring.  A mild laxative (Milk of Magnesia or Miralax) should be taken according to package directions if there are no bowel movements after 48 hours. °7. Unless discharge instructions indicate otherwise, you may remove your bandages 24-48 hours after surgery, and you may shower at that time.  You may have steri-strips (small skin tapes) in place directly over the incision.  These strips should be left on the skin for 7-10 days.  If your surgeon used skin glue on the incision, you may shower in 24 hours.  The glue will flake off over the  next 2-3 weeks.  Any sutures or staples will be removed at the office during your follow-up visit. °8. ACTIVITIES:  You may resume regular daily activities (gradually increasing) beginning the next day.  Wearing a good support bra or sports bra minimizes pain and swelling.  You may have sexual intercourse when it is comfortable. °a. You may drive when you no longer are taking prescription pain medication, you can comfortably wear a seatbelt, and you can safely maneuver your car and apply brakes. °b. RETURN TO WORK:  ______________________________________________________________________________________ °9. You should see your doctor in the office for a follow-up appointment approximately two weeks after your surgery.  Your doctor’s nurse will typically make your follow-up appointment when she calls you with your pathology report.  Expect your pathology report 2-3 business days after your surgery.  You may call to check if you do not hear from us after three days. °10. OTHER INSTRUCTIONS: _______________________________________________________________________________________________ _____________________________________________________________________________________________________________________________________ °_____________________________________________________________________________________________________________________________________ °_____________________________________________________________________________________________________________________________________ ° °WHEN TO CALL YOUR DOCTOR: °1. Fever over 101.0 °2. Nausea and/or vomiting. °3. Extreme swelling or bruising. °4. Continued bleeding from incision. °5. Increased pain, redness, or drainage from the incision. ° °The clinic staff is available to answer your questions during regular business hours.  Please don’t hesitate to call and ask to speak to one of the nurses for clinical concerns.  If you have a medical emergency, go to the nearest  emergency room or call 911.  A surgeon from [facility] is always on call at the hospital. ° °For further questions, please visit [email or web address]  °

## 2017-03-21 NOTE — Anesthesia Procedure Notes (Signed)
Anesthesia Regional Block: Pectoralis block   Pre-Anesthetic Checklist: ,, timeout performed, Correct Patient, Correct Site, Correct Laterality, Correct Procedure, Correct Position, site marked, Risks and benefits discussed,  Surgical consent,  Pre-op evaluation,  At surgeon's request and post-op pain management  Laterality: Left  Prep: chloraprep       Needles:  Injection technique: Single-shot  Needle Type: Stimulator Needle - 40          Additional Needles:   Procedures: Doppler guided,,,, ultrasound used (permanent image in chart),,,,  Narrative:  Start time: 03/21/2017 10:05 AM End time: 03/21/2017 10:20 AM Injection made incrementally with aspirations every 5 mL.  Performed by: Personally  Anesthesiologist: Belinda Block, MD

## 2017-03-21 NOTE — Anesthesia Preprocedure Evaluation (Signed)
Anesthesia Evaluation  Patient identified by MRN, date of birth, ID band Patient awake    Reviewed: Allergy & Precautions, NPO status , Patient's Chart, lab work & pertinent test results  History of Anesthesia Complications (+) Family history of anesthesia reaction  Airway Mallampati: II  TM Distance: >3 FB     Dental   Pulmonary sleep apnea ,    breath sounds clear to auscultation       Cardiovascular hypertension,  Rhythm:Regular Rate:Normal     Neuro/Psych    GI/Hepatic Neg liver ROS, GERD  ,  Endo/Other  diabetes  Renal/GU      Musculoskeletal   Abdominal   Peds  Hematology   Anesthesia Other Findings   Reproductive/Obstetrics                             Anesthesia Physical Anesthesia Plan  ASA: III  Anesthesia Plan: General   Post-op Pain Management:    Induction: Intravenous  PONV Risk Score and Plan: 3 and Treatment may vary due to age or medical condition, Midazolam, Ondansetron and Dexamethasone  Airway Management Planned: Oral ETT  Additional Equipment:   Intra-op Plan:   Post-operative Plan: Extubation in OR  Informed Consent: I have reviewed the patients History and Physical, chart, labs and discussed the procedure including the risks, benefits and alternatives for the proposed anesthesia with the patient or authorized representative who has indicated his/her understanding and acceptance.   Dental advisory given  Plan Discussed with: Anesthesiologist and CRNA  Anesthesia Plan Comments:         Anesthesia Quick Evaluation

## 2017-03-21 NOTE — Progress Notes (Signed)
Pt attempted to get out of bed, stated 'I cant get up, im too dizzy", pt back on strectcher and returmed tp bay. DR Dalbert Batman made aware and looked at breast site, no new complications, VS,will continue to monitor

## 2017-03-21 NOTE — Transfer of Care (Signed)
Immediate Anesthesia Transfer of Care Note  Patient: Melody Zhang  Procedure(s) Performed: LEFT BREAST LUMPECTOMY WITH RADIOACTIVE SEED AND LEFT AXILLARY DEEP LYMPH NODE BIOPSY INJECTION BLUE DYE LEFT BREAST ERAS PATHWAY (Left Breast)  Patient Location: PACU  Anesthesia Type:GA combined with regional for post-op pain  Level of Consciousness: drowsy and patient cooperative  Airway & Oxygen Therapy: Patient Spontanous Breathing and Patient connected to face mask oxygen  Post-op Assessment: Report given to RN and Post -op Vital signs reviewed and stable  Post vital signs: Reviewed and stable  Last Vitals:  Vitals:   03/21/17 1030 03/21/17 1224  BP: (!) 116/57 (!) 141/86  Pulse: 72 75  Resp: 13   Temp:  (!) 36.3 C  SpO2: 100% 95%    Last Pain:  Vitals:   03/21/17 0956  TempSrc: Oral         Complications: No apparent anesthesia complications

## 2017-03-22 ENCOUNTER — Encounter (HOSPITAL_COMMUNITY): Payer: Self-pay | Admitting: General Surgery

## 2017-03-25 ENCOUNTER — Ambulatory Visit: Payer: Self-pay | Admitting: Genetic Counselor

## 2017-03-25 DIAGNOSIS — C50212 Malignant neoplasm of upper-inner quadrant of left female breast: Secondary | ICD-10-CM

## 2017-03-25 DIAGNOSIS — Z803 Family history of malignant neoplasm of breast: Secondary | ICD-10-CM

## 2017-03-25 DIAGNOSIS — Z800 Family history of malignant neoplasm of digestive organs: Secondary | ICD-10-CM

## 2017-03-25 DIAGNOSIS — Z170 Estrogen receptor positive status [ER+]: Secondary | ICD-10-CM

## 2017-03-25 DIAGNOSIS — Z1379 Encounter for other screening for genetic and chromosomal anomalies: Secondary | ICD-10-CM

## 2017-03-25 NOTE — Progress Notes (Signed)
HPI: Melody Zhang was previously seen in the [facility] due to a personal and family history of cancer and concerns regarding a hereditary predisposition to cancer. Please refer to our prior cancer genetics clinic note for more information regarding Melody Zhang's medical, social and family histories, and our assessment and recommendations, at the time. Melody Zhang recent genetic test results were disclosed to her, as were recommendations warranted by these results. These results and recommendations are discussed in more detail below.  CANCER HISTORY:    Malignant neoplasm of upper-inner quadrant of left breast in female, estrogen receptor positive ([place])   02/24/2017 Initial Diagnosis    Screening detected left breast mass UIQ 1130 position: 1.9 cm, ultrasound biopsy: IDC grade 2, DCIS, ER 100%, PR 20%, Ki-67 25%, HER-2 negative ratio 1.43, T1 cN0 stage I a AJCC 8      03/17/2017 Genetic Testing    Negative genetic testing on the 9-gene STAT panel.  The STAT Breast cancer panel offered by [facility] includes sequencing and rearrangement analysis for the following 9 genes:  ATM, BRCA1, BRCA2, CDH1, CHEK2, PALB2, PTEN, STK11 and TP53.   The report date is March 17, 2017.  Testing was reflexed to larger common hereditary cancer panel.    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  The Hereditary Gene Panel offered by [facility] includes sequencing and/or deletion duplication testing of the following 47 genes: APC, ATM, AXIN2, BARD1, BMPR1A, BRCA1, BRCA2, BRIP1, CDH1, CDK4, CDKN2A (p14ARF), CDKN2A (p16INK4a), CHEK2, CTNNA1, DICER1, EPCAM (Deletion/duplication testing only), GREM1 (promoter region deletion/duplication testing only), KIT, MEN1, MLH1, MSH2, MSH3, MSH6, MUTYH, NBN, NF1, NHTL1, PALB2, PDGFRA, PMS2, POLD1, POLE, PTEN, RAD50, RAD51C, RAD51D, SDHB, SDHC, SDHD, SMAD4, SMARCA4. STK11, TP53, TSC1, TSC2, and VHL.  The following genes were evaluated for sequence  changes only: SDHA and HOXB13 c.251G>A variant only. The report date is March 19, 2017.        FAMILY HISTORY:  We obtained a detailed, 4-generation family history.  Significant diagnoses are listed below: Family History  Problem Relation Age of Onset  . Healthy Mother        There is no family history of arrythmia  . Multiple myeloma Mother 45  . Heart attack Father 31       There is no family history of arrythmia  . Lymphoma Maternal Aunt   . Liver cancer Maternal Uncle   . Pancreatic cancer Maternal Grandmother 44  . Pancreatic cancer Cousin        paternal first cousin  . Breast cancer Other        mat great aunt    The patient has two boys and a girl who are all cancer free.  She has three brothers and a sister who are all cancer free, as are their children.  Both parents are deceased.  The patient's mother died at 16 from multiple myeloma.  She had two brothers and five sisters.  One brother had liver cancer, unknown if this was the primary cancer, and a sister had lymphoma.  The maternal grandparents are deceased.  The grandmother had pancreatic cancer.  She had one sister who had breast cancer.  This sister had a daughter, and that daughter also had a daughter, all who had breast cancer.  The patient's father died of a heart attack at 44.  He had 4-5 siblings.  One sister had a daughter who had pancreatic cancer.  The paternal grandparents are deceased from non cancer related  issues.  Melody Zhang is unaware of previous family history of genetic testing for hereditary cancer risks. Patient's maternal ancestors are of African American descent, and paternal ancestors are of African American descent. There is no reported Ashkenazi Jewish ancestry. There is no known consanguinity.  GENETIC TEST RESULTS: Genetic testing reported out on March 19, 2017 through the common hereditary cancer panel found no deleterious mutations.  The Hereditary Gene Panel offered by [facility]  includes sequencing and/or deletion duplication testing of the following 47 genes: APC, ATM, AXIN2, BARD1, BMPR1A, BRCA1, BRCA2, BRIP1, CDH1, CDK4, CDKN2A (p14ARF), CDKN2A (p16INK4a), CHEK2, CTNNA1, DICER1, EPCAM (Deletion/duplication testing only), GREM1 (promoter region deletion/duplication testing only), KIT, MEN1, MLH1, MSH2, MSH3, MSH6, MUTYH, NBN, NF1, NHTL1, PALB2, PDGFRA, PMS2, POLD1, POLE, PTEN, RAD50, RAD51C, RAD51D, SDHB, SDHC, SDHD, SMAD4, SMARCA4. STK11, TP53, TSC1, TSC2, and VHL.  The following genes were evaluated for sequence changes only: SDHA and HOXB13 c.251G>A variant only.  The test report has been scanned into EPIC and is located under the Molecular Pathology section of the Results Review tab.   We discussed with Ms. Poulter that since the current genetic testing is not perfect, it is possible there may be a gene mutation in one of these genes that current testing cannot detect, but that chance is small. We also discussed, that it is possible that another gene that has not yet been discovered, or that we have not yet tested, is responsible for the cancer diagnoses in the family, and it is, therefore, important to remain in touch with cancer genetics in the future so that we can continue to offer [name] the most up to date genetic testing.   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 This is a variant in the Intronic region. At this time, it is unknown if this variant is associated with increased cancer risk or if this is a normal finding, but most variants such as this get reclassified to being inconsequential. It should not be used to make medical management decisions. With time, we suspect the lab will determine the significance of this variant, if any. If we do learn more about it, we will try to contact Melody Zhang to discuss it further. However, it is important to stay in touch with [place] periodically and keep the address and  phone number up to date.    CANCER SCREENING RECOMMENDATIONS:  This result is reassuring and indicates that Melody Zhang likely does not have an increased risk for a future cancer due to a mutation in one of these genes. This normal test also suggests that Ms. Bizzarro's cancer was most likely not due to an inherited predisposition associated with one of these genes.  Most cancers happen by chance and this negative test suggests that her cancer falls into this category.  We, therefore, recommended she continue to follow the cancer management and screening guidelines provided by her oncology and primary healthcare provider.   An individual's cancer risk and medical management are not determined by genetic test results alone. Overall cancer risk assessment incorporates additional factors, including personal medical history, family history, and any available genetic information that may result in a personalized plan for cancer prevention and surveillance.  RECOMMENDATIONS FOR FAMILY MEMBERS: Women in this family might be at some increased risk of developing cancer, over the general population risk, simply due to the family history of cancer. We recommended women in this family have a yearly mammogram beginning at age 30,  or 10 years younger than the earliest onset of cancer, an annual clinical breast exam, and perform monthly breast self-exams. Women in this family should also have a gynecological exam as recommended by their primary provider. All family members should have a colonoscopy by age 34.  FOLLOW-UP: Lastly, we discussed with Ms. Cumbee that cancer genetics is a rapidly advancing field and it is possible that new genetic tests will be appropriate for her and/or her family members in the future. We encouraged her to remain in contact with cancer genetics on an annual basis so we can update her personal and family histories and let her know of advances in cancer genetics that may benefit this family.    Our contact number was provided. Ms. Mcalpine questions were answered to her satisfaction, and she knows she is welcome to call us at anytime with additional questions or concerns.   Roma Kayser, MS, [facility] Certified Genetic Counselor [email or web address]

## 2017-03-31 ENCOUNTER — Inpatient Hospital Stay: Payer: 59 | Attending: Hematology and Oncology | Admitting: Hematology and Oncology

## 2017-03-31 ENCOUNTER — Telehealth: Payer: Self-pay | Admitting: *Deleted

## 2017-03-31 DIAGNOSIS — C50212 Malignant neoplasm of upper-inner quadrant of left female breast: Secondary | ICD-10-CM | POA: Insufficient documentation

## 2017-03-31 DIAGNOSIS — Z170 Estrogen receptor positive status [ER+]: Secondary | ICD-10-CM

## 2017-03-31 NOTE — Assessment & Plan Note (Signed)
03/21/2017: Left lumpectomy: IDC grade 2, 1.5 cm, DCIS intermediate grade, margins negative, intraductal papilloma and fibrocystic changes, 0/3 lymph nodes negative, ER 100%, PR 20%, HER-2 negative, Ki-67 25%, T1CN0 stage I a  Pathology counseling: I discussed the final pathology report of the patient provided  a copy of this report. I discussed the margins as well as lymph node surgeries. We also discussed the final staging along with previously performed ER/PR and HER-2/neu testing.   Treatment plan: 1. Oncotype DX testing to determine if chemotherapy would be of any benefit followed by 2. Adjuvant radiation therapy followed by 3. Adjuvant antiestrogen therapy  Return to clinic based upon Oncotype DX testing

## 2017-03-31 NOTE — Telephone Encounter (Signed)
Received order for oncotype testing. Requisition faxed to pathology 

## 2017-03-31 NOTE — Progress Notes (Signed)
Patient Care Team: Maury Dus, MD as PCP - General (Family Medicine)  DIAGNOSIS:  Encounter Diagnosis  Name Primary?  . Malignant neoplasm of upper-inner quadrant of left breast in female, estrogen receptor positive ([place])     SUMMARY OF ONCOLOGIC HISTORY:   Malignant neoplasm of upper-inner quadrant of left breast in female, estrogen receptor positive ([place])   02/24/2017 Initial Diagnosis    Screening detected left breast mass UIQ 1130 position: 1.9 cm, ultrasound biopsy: IDC grade 2, DCIS, ER 100%, PR 20%, Ki-67 25%, HER-2 negative ratio 1.43, T1 cN0 stage I a AJCC 8      03/17/2017 Genetic Testing    Negative genetic testing on the 9-gene STAT panel.  The STAT Breast cancer panel offered by [facility] includes sequencing and rearrangement analysis for the following 9 genes:  ATM, BRCA1, BRCA2, CDH1, CHEK2, PALB2, PTEN, STK11 and TP53.   The report date is March 17, 2017.  Testing was reflexed to larger common hereditary cancer panel.    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  The Hereditary Gene Panel offered by [facility] includes sequencing and/or deletion duplication testing of the following 47 genes: APC, ATM, AXIN2, BARD1, BMPR1A, BRCA1, BRCA2, BRIP1, CDH1, CDK4, CDKN2A (p14ARF), CDKN2A (p16INK4a), CHEK2, CTNNA1, DICER1, EPCAM (Deletion/duplication testing only), GREM1 (promoter region deletion/duplication testing only), KIT, MEN1, MLH1, MSH2, MSH3, MSH6, MUTYH, NBN, NF1, NHTL1, PALB2, PDGFRA, PMS2, POLD1, POLE, PTEN, RAD50, RAD51C, RAD51D, SDHB, SDHC, SDHD, SMAD4, SMARCA4. STK11, TP53, TSC1, TSC2, and VHL.  The following genes were evaluated for sequence changes only: SDHA and HOXB13 c.251G>A variant only. The report date is March 19, 2017.       03/21/2017 Surgery    Left lumpectomy: IDC grade 2, 1.5 cm, DCIS intermediate grade, margins negative, intraductal papilloma and fibrocystic changes, 0/3 lymph nodes negative, ER 100%, PR 20%, HER-2 negative,  Ki-67 25%, T1CN0 stage I a       CHIEF COMPLIANT: Follow-up after recent left lumpectomy  INTERVAL HISTORY: Melody Zhang is a 62 year old with above-mentioned history of left breast cancer treated with lumpectomy and is here today to discuss pathology report.  She is recovering very well from surgery.  She had feels like there is swelling underneath her left axilla.  REVIEW OF SYSTEMS:   Constitutional: Denies fevers, chills or abnormal weight loss Eyes: Denies blurriness of vision Ears, nose, mouth, throat, and face: Denies mucositis or sore throat Respiratory: Denies cough, dyspnea or wheezes Cardiovascular: Denies palpitation, chest discomfort Gastrointestinal:  Denies nausea, heartburn or change in bowel habits Skin: Denies abnormal skin rashes Lymphatics: Denies new lymphadenopathy or easy bruising Neurological:Denies numbness, tingling or new weaknesses Behavioral/Psych: Mood is stable, no new changes  Extremities: No lower extremity edema Breast: Left lumpectomy All other systems were reviewed with the patient and are negative.  I have reviewed the past medical history, past surgical history, social history and family history with the patient and they are unchanged from previous note.  ALLERGIES:  is allergic to dobutamine and lisinopril.  MEDICATIONS:  Current Outpatient Medications  Medication Sig Dispense Refill  . aspirin 81 MG EC tablet Take 81 mg by mouth every evening.    . calcium carbonate (TUMS - DOSED IN MG ELEMENTAL CALCIUM) 500 MG chewable tablet Chew 2 tablets by mouth 3 (three) times daily as needed for indigestion or heartburn.    . diltiazem (CARDIZEM) 120 MG tablet Take 120 mg by mouth every evening.  0  . ferrous sulfate 325 (65 FE) MG  tablet Take 325 mg by mouth every evening.    Marland Kitchen HYDROcodone-acetaminophen (NORCO) 5-325 MG tablet Take 1-2 tablets by mouth every 6 (six) hours as needed for moderate pain or severe pain. 20 tablet 0  . ibuprofen  (ADVIL,MOTRIN) 200 MG tablet Take 800 mg by mouth every 8 (eight) hours as needed (for pain.).    Marland Kitchen KLOR-CON M20 20 MEQ tablet TAKE ONE TABLET BY MOUTH EVERY DAY (Patient taking differently: TAKE ONE TABLET BY MOUTH EVERY DAY IN THE EVENING.) 15 tablet 0  . losartan-hydrochlorothiazide (HYZAAR) 100-25 MG tablet Take 1 tablet by mouth every evening.  1  . metFORMIN (GLUCOPHAGE-XR) 750 MG 24 hr tablet Take 750 mg by mouth every evening.  0  . nitroGLYCERIN (NITROSTAT) 0.4 MG SL tablet Place 1 tablet (0.4 mg total) under the tongue every 5 (five) minutes x 3 doses as needed for chest pain. 25 tablet 3  . Probiotic Product (ACIDOPHILUS/BIFIDUS PO) Take 1 capsule by mouth every evening.     . rosuvastatin (CRESTOR) 20 MG tablet Take 20 mg by mouth every evening.    . vitamin B-12 (CYANOCOBALAMIN) 500 MCG tablet Take 1,000 mcg by mouth every evening.     . vitamin C (ASCORBIC ACID) 500 MG tablet Take 500 mg by mouth every evening.      No current facility-administered medications for this visit.     PHYSICAL EXAMINATION: ECOG PERFORMANCE STATUS: 1 - Symptomatic but completely ambulatory  Vitals:   03/31/17 1503  BP: 126/71  Pulse: 89  Resp: 18  Temp: 98.4 F (36.9 C)  SpO2: 99%   Filed Weights   03/31/17 1503  Weight: 272 lb 1.6 oz (123.4 kg)    GENERAL:alert, no distress and comfortable SKIN: skin color, texture, turgor are normal, no rashes or significant lesions EYES: normal, Conjunctiva are pink and non-injected, sclera clear OROPHARYNX:no exudate, no erythema and lips, buccal mucosa, and tongue normal  NECK: supple, thyroid normal size, non-tender, without nodularity LYMPH:  no palpable lymphadenopathy in the cervical, axillary or inguinal LUNGS: clear to auscultation and percussion with normal breathing effort HEART: regular rate & rhythm and no murmurs and no lower extremity edema ABDOMEN:abdomen soft, non-tender and normal bowel sounds MUSCULOSKELETAL:no cyanosis of digits  and no clubbing  NEURO: alert & oriented x 3 with fluent speech, no focal motor/sensory deficits EXTREMITIES: No lower extremity edema  LABORATORY DATA:  I have reviewed the data as listed CMP Latest Ref Rng & Units 03/18/2017 03/05/2017 04/12/2011  Glucose 65 - 99 mg/dL 99 102 97  BUN 6 - 20 mg/dL _0 Creatinine 0.44 - 1.00 mg/dL 0.93 0.92 0.8  Sodium 135 - 145 mmol/L 140 140 139  Potassium 3.5 - 5.1 mmol/L 3.5 3.5 3.7  Chloride 101 - 111 mmol/L 102 102 102  CO2 22 - 32 mmol/L _1 Calcium 8.9 - 10.3 mg/dL 9.7 9.9 9.6  Total Protein 6.4 - 8.3 g/dL - 7.4 -  Total Bilirubin 0.2 - 1.2 mg/dL - 0.3 -  Alkaline Phos 40 - 150 U/L - 73 -  AST 5 - 34 U/L - 14 -  ALT 0 - 55 U/L - 13 -    Lab Results  Component Value Date   WBC 7.4 03/18/2017   HGB 12.0 03/18/2017   HCT 38.1 03/18/2017   MCV 81.8 03/18/2017   PLT 280 03/18/2017   NEUTROABS 7.0 (H) 03/05/2017    ASSESSMENT & PLAN:  Malignant neoplasm of upper-inner quadrant of left  breast in female, estrogen receptor positive ([place]) 03/21/2017: Left lumpectomy: IDC grade 2, 1.5 cm, DCIS intermediate grade, margins negative, intraductal papilloma and fibrocystic changes, 0/3 lymph nodes negative, ER 100%, PR 20%, HER-2 negative, Ki-67 25%, T1CN0 stage I a  Pathology counseling: I discussed the final pathology report of the patient provided  a copy of this report. I discussed the margins as well as lymph node surgeries. We also discussed the final staging along with previously performed ER/PR and HER-2/neu testing.   Treatment plan: 1. Oncotype DX testing to determine if chemotherapy would be of any benefit followed by 2. Adjuvant radiation therapy followed by 3. Adjuvant antiestrogen therapy  Return to clinic based upon Oncotype DX testing   I spent 25 minutes talking to the patient of which more than half was spent in counseling and coordination of care.  No orders of the defined types were placed in this encounter.  The  patient has a good understanding of the overall plan. she agrees with it. she will call with any problems that may develop before the next visit here.   Harriette Ohara, MD 03/31/17

## 2017-04-03 MED FILL — METFORMIN HCL ER 750 MG TAB: 750 | 90 days supply | Qty: 90 | Fill #0

## 2017-04-03 MED FILL — ROSUVASTATIN CALCIUM 20 MG: 20 | 90 days supply | Qty: 90 | Fill #0

## 2017-04-03 MED FILL — dilTIAZem HCL 120 MG TABS: 120 | 90 days supply | Qty: 90 | Fill #0

## 2017-04-04 ENCOUNTER — Telehealth: Payer: Self-pay

## 2017-04-04 NOTE — Telephone Encounter (Signed)
Received VM from [facility] from [place] testing regarding the specimen for this patient and they request a call back.  Will route this note to Dr Lindi Adie and [place] navigator to follow up on and return call to [facility], at [phone] ext [phone].

## 2017-04-10 DIAGNOSIS — Z170 Estrogen receptor positive status [ER+]: Secondary | ICD-10-CM | POA: Diagnosis not present

## 2017-04-10 DIAGNOSIS — C50212 Malignant neoplasm of upper-inner quadrant of left female breast: Secondary | ICD-10-CM | POA: Diagnosis not present

## 2017-04-11 ENCOUNTER — Encounter (HOSPITAL_COMMUNITY): Payer: Self-pay | Admitting: Hematology and Oncology

## 2017-04-14 ENCOUNTER — Ambulatory Visit (HOSPITAL_BASED_OUTPATIENT_CLINIC_OR_DEPARTMENT_OTHER): Payer: 59 | Attending: Internal Medicine | Admitting: Internal Medicine

## 2017-04-14 ENCOUNTER — Telehealth: Payer: Self-pay | Admitting: *Deleted

## 2017-04-14 ENCOUNTER — Encounter: Payer: Self-pay | Admitting: Radiation Oncology

## 2017-04-14 DIAGNOSIS — G4730 Sleep apnea, unspecified: Secondary | ICD-10-CM

## 2017-04-14 DIAGNOSIS — G4719 Other hypersomnia: Secondary | ICD-10-CM | POA: Insufficient documentation

## 2017-04-14 DIAGNOSIS — R0683 Snoring: Secondary | ICD-10-CM

## 2017-04-14 DIAGNOSIS — G4710 Hypersomnia, unspecified: Secondary | ICD-10-CM

## 2017-04-14 DIAGNOSIS — C50212 Malignant neoplasm of upper-inner quadrant of left female breast: Secondary | ICD-10-CM

## 2017-04-14 DIAGNOSIS — Z170 Estrogen receptor positive status [ER+]: Secondary | ICD-10-CM

## 2017-04-14 DIAGNOSIS — G4733 Obstructive sleep apnea (adult) (pediatric): Secondary | ICD-10-CM | POA: Diagnosis not present

## 2017-04-14 NOTE — Telephone Encounter (Signed)
Received oncotype score of 20/6%. Physician team notified. Called pt with results. Discussed she does not need chemo and next step is xrt with Dr. Lisbeth Renshaw. Referral placed.

## 2017-04-18 NOTE — Procedures (Signed)
    NAME: Melody Zhang DATE OF BIRTH:  01-Nov-1955 MEDICAL RECORD NUMBER [id number]  LOCATION: [facility]  PHYSICIAN: Marius Ditch  DATE OF STUDY: 04/14/2017  SLEEP STUDY TYPE: Out of Center Sleep Test                REFERRING PHYSICIAN: Marius Ditch, MD  INDICATION FOR STUDY: Excessive daytime sleepiness.   EPWORTH SLEEPINESS SCORE:  NA HEIGHT: 5\' 7"  (170.2 cm)  WEIGHT: 272 lb (123.4 kg)    Body mass index is 42.6 kg/m.  NECK SIZE: 14.5 in.  MEDICATIONS  Patient self administered medications include: N/A. Medications administered during study include No sleep medicine administered.Marland Kitchen   SLEEP STUDY TECHNIQUE  A multi-channel overnight portable sleep study was performed. The channels recorded were: nasal and oral airflow, thoracic and abdominal respiratory movement, and oxygen saturation with a pulse oximetry. Snoring and body position were also monitored. TECHNICIAN COMMENTS  Comments added by Technician: N/A Comments added by Scorer: N/A   RECORDING SUMMARY  The study was initiated at 11:54:54 PM and terminated at 5:22:12 AM. The total recorded time was 327.3 minutes. Time in bed was 327.3 minutes.   RESPIRATORY PARAMETERS  The overall AHI was 37.4 per hour, with a central apnea index of 0.0 per hour. The patient was supine for 63%. The arousal index was 0.0 per hour.The oxygen nadir was 80% during sleep.  CARDIAC DATA  Mean heart rate during sleep was 67.7 bpm.  IMPRESSIONS  - Severe Oxygen Desaturation  - Severe Obstructive Sleep apnea(OSA)  - No Significant Central Sleep Apnea (CSA)   DIAGNOSIS  - Obstructive Sleep Apnea (327.23 [G47.33 ICD-10])    RECOMMENDATIONS  CPAP is recommended. It could be started with APAP.   Marius Ditch Sleep specialist, [place] Board of Internal Medicine  ELECTRONICALLY SIGNED ON:  04/18/2017, 9:27 PM [place] PH: [phone]   FX: [phone] [place]

## 2017-04-21 ENCOUNTER — Ambulatory Visit: Payer: 59 | Attending: General Surgery | Admitting: Physical Therapy

## 2017-04-21 ENCOUNTER — Other Ambulatory Visit: Payer: Self-pay

## 2017-04-21 ENCOUNTER — Encounter: Payer: Self-pay | Admitting: Physical Therapy

## 2017-04-21 DIAGNOSIS — Z170 Estrogen receptor positive status [ER+]: Secondary | ICD-10-CM | POA: Diagnosis not present

## 2017-04-21 DIAGNOSIS — C50212 Malignant neoplasm of upper-inner quadrant of left female breast: Secondary | ICD-10-CM | POA: Diagnosis not present

## 2017-04-21 DIAGNOSIS — R600 Localized edema: Secondary | ICD-10-CM | POA: Insufficient documentation

## 2017-04-21 DIAGNOSIS — M25612 Stiffness of left shoulder, not elsewhere classified: Secondary | ICD-10-CM | POA: Diagnosis not present

## 2017-04-21 DIAGNOSIS — R293 Abnormal posture: Secondary | ICD-10-CM | POA: Insufficient documentation

## 2017-04-21 NOTE — Therapy (Signed)
[place], [place], [address] Phone: [phone]   Fax:  [phone]  Physical Therapy Evaluation  Patient Details  Name: Melody Zhang MRN: [id number] Date of Birth: [birth year] Referring Provider: Dr. Fanny Skates   Encounter Date: 04/21/2017  PT End of Session - 04/21/17 1154    Visit Number  2    Number of Visits  10    Date for PT Re-Evaluation  05/19/17    PT Start Time  1102    PT Stop Time  1150    PT Time Calculation (min)  48 min    Activity Tolerance  Patient tolerated treatment well    Behavior During Therapy  [facility] for tasks assessed/performed       Past Medical History:  Diagnosis Date  . Cancer [facility])    breast cancer  . Diabetes mellitus   . Family history of adverse reaction to anesthesia    Brother's heart stopped during surgery on urethra at [facility]  . Family history of breast cancer   . Family history of pancreatic cancer   . GERD (gastroesophageal reflux disease)    not on medication for this, TUMS PRN  . Heart murmur   . Hyperlipemia   . Hypertension   . Intermittent palpitations    once a month  . Paroxysmal SVT (supraventricular tachycardia) (HCC)    Myoview was performed 03/26/11: Low risk with small partially reversible apical perfusion defect likely breast attenuation, no ischemia, no scar, EF 62%.   . Sleep apnea    CPAP    Past Surgical History:  Procedure Laterality Date  . ABDOMINAL HYSTERECTOMY    . BREAST LUMPECTOMY WITH RADIOACTIVE SEED AND SENTINEL LYMPH NODE BIOPSY Left 03/21/2017   Procedure: LEFT BREAST LUMPECTOMY WITH RADIOACTIVE SEED AND LEFT AXILLARY DEEP LYMPH NODE BIOPSY INJECTION BLUE DYE LEFT BREAST ERAS PATHWAY;  Surgeon: Fanny Skates, MD;  Location: [place];  Service: General;  Laterality: Left;  . HERNIA REPAIR     umbilical hernia    There were no vitals filed for this visit.   Subjective Assessment - 04/21/17 1108    Subjective   Patient reports she underwent a left lumpectomy and sentinel node biopsy (0/3 nodes) on 03/21/17. No chemo but will begin radiation in 1-2 weeks. She has some concerns about axillary edema but reports she is using her arm fairly well.    Pertinent History  Patient was diagnosed on 02/13/17 with left grade 2 invasive ductal carcinoma breast cancer. It was located in the upper inner quadrant. It is ER/PR positive and HER2 negative with a Ki67 of 25%. She had a left lumpectomy and sentinel node biopsy (0/3 nodes) on 03/21/17.    Patient Stated Goals  See if arm is ok    Currently in Pain?  Yes    Pain Score  2     Pain Location  Axilla    Pain Orientation  Left    Pain Descriptors / Indicators  Tightness    Pain Type  Surgical pain    Pain Onset  1 to 4 weeks ago    Pain Frequency  Intermittent    Aggravating Factors   Unknown    Pain Relieving Factors  Unknown    Multiple Pain Sites  No         [facility] PT Assessment - 04/21/17 0001      Assessment   Medical Diagnosis  s/p left lumpectomy and SLNB    Referring Provider  Dr. Fanny Skates    Onset Date/Surgical Date  03/21/17    Hand Dominance  Right    Prior Therapy  Baseline assessment      Precautions   Precautions  Other (comment)    Precaution Comments  recent cancer surgery      Restrictions   Weight Bearing Restrictions  No      Balance Screen   Has the patient fallen in the past 6 months  No    Has the patient had a decrease in activity level because of a fear of falling?   No    Is the patient reluctant to leave their home because of a fear of falling?   No      Home Environment   Living Environment  Private residence    Living Arrangements  Children 62 y.o. 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 Edema and fibrosis present just superior to breast incision.      Posture/Postural Control   Posture/Postural Control  Postural limitations    Postural Limitations  Rounded Shoulders;Forward head      ROM / Strength   AROM / PROM / Strength  AROM      AROM   AROM Assessment Site  Shoulder    Right/Left Shoulder  Left    Left Shoulder Extension  59 Degrees    Left Shoulder Flexion  146 Degrees    Left Shoulder ABduction  139 Degrees    Left Shoulder Internal Rotation  64 Degrees    Left Shoulder External Rotation  71 Degrees        LYMPHEDEMA/ONCOLOGY QUESTIONNAIRE - 04/21/17 1125      Type   Cancer Type  Left breast      Surgeries   Lumpectomy Date  03/21/17    Sentinel Lymph Node Biopsy Date  03/21/17    Number Lymph Nodes Removed  3      Treatment   Active Chemotherapy Treatment  No    Past Chemotherapy Treatment  No    Active Radiation Treatment  No    Past Radiation Treatment  No    Current Hormone Treatment  No    Past Hormone Therapy  No      What other symptoms do you have   Are you Having Heaviness or Tightness  No    Are you having Pain  Yes    Are you having pitting edema  No    Is it Hard or Difficult finding clothes that fit  No    Do you have infections  No    Is there Decreased scar mobility  Yes    [place] Sign  No      Lymphedema Assessments   Lymphedema Assessments  Upper extremities      Right Upper Extremity Lymphedema   10 cm Proximal to Olecranon Process  36.5 cm    Olecranon Process  28.3 cm    10 cm Proximal to Ulnar Styloid Process  23.1 cm    Just Proximal to  Ulnar Styloid Process  17.5 cm    Across Hand at [employer]  20.9 cm    At [place] of 2nd Digit  6.9 cm      Left Upper Extremity Lymphedema   10 cm Proximal to Olecranon Process  36.2 cm    Olecranon Process  27.9 cm    10  cm Proximal to Ulnar Styloid Process  22.8 cm    Just Proximal to Ulnar Styloid Process  17.2 cm    Across Hand at [employer]  20 cm    At [place] of 2nd Digit  7 cm          Quick Dash - 04/21/17 0001    Open a tight or new jar  Moderate difficulty    Do heavy household chores (wash walls, wash floors)  No difficulty    Carry a shopping bag or briefcase  No difficulty    Wash your back  No difficulty    Use a knife to cut food  No difficulty    Recreational activities in which you take some force or impact through your arm, shoulder, or hand (golf, hammering, tennis)  Moderate difficulty    During the past week, to what extent has your arm, shoulder or hand problem interfered with your normal social activities with family, friends, neighbors, or groups?  Not at all    During the past week, to what extent has your arm, shoulder or hand problem limited your work or other regular daily activities  Slightly    Arm, shoulder, or hand pain.  None    Tingling (pins and needles) in your arm, shoulder, or hand  Moderate    Difficulty Sleeping  No difficulty    DASH Score  15.91 %      No data recorded  Objective measurements completed on examination: See above findings.              PT Education - 04/21/17 1153    Education provided  Yes    Education Details  Importance of exercise, walking program, weightbearing    Person(s) Educated  Patient    Methods  Explanation    Comprehension  Verbalized understanding          PT Long Term Goals - 04/21/17 1158      PT LONG TERM GOAL #1   Title  Patient will demonstrate she has returned to baseline related to shoulder ROM and function post operatively.    Time  4    Period  Weeks    Status  On-going      PT LONG TERM GOAL #2   Title  Patient will improve her DASH score to be </= 8 for improved overall shoulder function.    Baseline  15.91    Time  4    Period  Weeks    Status  New      PT LONG TERM GOAL #3   Title   Increase left shoulder abduction active ROM to >/= 155 for increase ease with reaching and to return to pre-op baseline measurement.    Baseline  139 degrees post operatively    Time  4    Period  Weeks    Status  New      PT LONG TERM GOAL #4   Title  Patient will verbalize understanding of risk reduction practices.    Time  4    Period  Weeks    Status  New  Breast Clinic Goals - 03/05/17 1544      Patient will be able to verbalize understanding of pertinent lymphedema risk reduction practices relevant to her diagnosis specifically related to skin care.   Time  1    Period  Days    Status  Achieved      Patient will be able to return demonstrate and/or verbalize understanding of the post-op home exercise program related to regaining shoulder range of motion.   Time  1    Period  Days    Status  Achieved      Patient will be able to verbalize understanding of the importance of attending the postoperative After Breast Cancer Class for further lymphedema risk reduction education and therapeutic exercise.   Time  1    Period  Days    Status  Achieved            Plan - 04/21/17 1154    Clinical Impression Statement  Patient is doing well 1 month post op left lumpectomy and SLNB. She is lacking some shoulder abduction ROM due to axillary tightness and has some edema and fibrosis present just superior to her breast incision and would benefit from PT for both of those. She will begin radiation in the next 2 weeks but has no need for chemotherapy. She was encouraged to begin a walking program for reducing recurrence risk and reducing fatigue during radiation.    Rehab Potential  Excellent    PT Frequency  2x / week    PT Duration  4 weeks    PT Treatment/Interventions  ADLs/Self Care Home Management;Therapeutic exercise;Patient/family education;Therapeutic activities;Manual techniques;Passive range of motion;Scar mobilization;Manual lymph drainage    PT Next Visit Plan   Manual lymph drainage focues on left superior breast; PROM and ROM exercises left shoulder to reduce axillary tightness    PT Home Exercise Plan  Post op shoulder ROM HEP    Consulted and Agree with Plan of Care  Patient       Patient will benefit from skilled therapeutic intervention in order to improve the following deficits and impairments:  Decreased knowledge of precautions, Decreased range of motion, Impaired UE functional use, Postural dysfunction, Pain, Increased edema, Decreased scar mobility  Visit Diagnosis: Malignant neoplasm of upper-inner quadrant of left breast in female, estrogen receptor positive ([place]) - Plan: PT plan of care cert/re-cert  Abnormal posture - Plan: PT plan of care cert/re-cert  Stiffness of left shoulder, not elsewhere classified - Plan: PT plan of care cert/re-cert  Localized edema - Plan: PT plan of care cert/re-cert     Problem List Patient Active Problem List   Diagnosis Date Noted  . Genetic testing 03/18/2017  . Family history of breast cancer   . Family history of pancreatic cancer   . Malignant neoplasm of upper-inner quadrant of left breast in female, estrogen receptor positive ([place]) 03/04/2017  . SVT (supraventricular tachycardia) ([place]) 03/18/2011  . Hypokalemia 03/18/2011  . Diabetes mellitus 03/18/2011  . HYPERLIPIDEMIA 01/30/2007  . HYPERTENSION 01/30/2007  . ALLERGIC RHINITIS 01/30/2007   Annia Friendly, PT 04/21/17 12:02 PM  [place], [place], [address] Phone: [phone]   Fax:  [phone]  Name: Foye Haggart MRN: [id number] Date of Birth: 1955-08-01

## 2017-04-22 ENCOUNTER — Ambulatory Visit: Payer: 59

## 2017-04-22 DIAGNOSIS — R293 Abnormal posture: Secondary | ICD-10-CM | POA: Diagnosis not present

## 2017-04-22 DIAGNOSIS — M25612 Stiffness of left shoulder, not elsewhere classified: Secondary | ICD-10-CM | POA: Diagnosis not present

## 2017-04-22 DIAGNOSIS — C50212 Malignant neoplasm of upper-inner quadrant of left female breast: Secondary | ICD-10-CM | POA: Diagnosis not present

## 2017-04-22 DIAGNOSIS — R600 Localized edema: Secondary | ICD-10-CM

## 2017-04-22 DIAGNOSIS — Z170 Estrogen receptor positive status [ER+]: Secondary | ICD-10-CM | POA: Diagnosis not present

## 2017-04-22 NOTE — Therapy (Addendum)
[place], [place], [address] Phone: [phone]   Fax:  [phone]  Physical Therapy Treatment  Patient Details  Name: Melody Zhang MRN: [id number] Date of Birth: [birth year] Referring Provider: Dr. Fanny Skates   Encounter Date: 04/22/2017  PT End of Session - 04/22/17 1205    Visit Number  3    Number of Visits  10    Date for PT Re-Evaluation  05/19/17    PT Start Time  1104    PT Stop Time  1152    PT Time Calculation (min)  48 min    Activity Tolerance  Patient tolerated treatment well    Behavior During Therapy  [facility] for tasks assessed/performed       Past Medical History:  Diagnosis Date  . Cancer [facility])    breast cancer  . Diabetes mellitus   . Family history of adverse reaction to anesthesia    Brother's heart stopped during surgery on urethra at [facility]  . Family history of breast cancer   . Family history of pancreatic cancer   . GERD (gastroesophageal reflux disease)    not on medication for this, TUMS PRN  . Heart murmur   . Hyperlipemia   . Hypertension   . Intermittent palpitations    once a month  . Paroxysmal SVT (supraventricular tachycardia) (HCC)    Myoview was performed 03/26/11: Low risk with small partially reversible apical perfusion defect likely breast attenuation, no ischemia, no scar, EF 62%.   . Sleep apnea    CPAP    Past Surgical History:  Procedure Laterality Date  . ABDOMINAL HYSTERECTOMY    . BREAST LUMPECTOMY WITH RADIOACTIVE SEED AND SENTINEL LYMPH NODE BIOPSY Left 03/21/2017   Procedure: LEFT BREAST LUMPECTOMY WITH RADIOACTIVE SEED AND LEFT AXILLARY DEEP LYMPH NODE BIOPSY INJECTION BLUE DYE LEFT BREAST ERAS PATHWAY;  Surgeon: Fanny Skates, MD;  Location: [place];  Service: General;  Laterality: Left;  . HERNIA REPAIR     umbilical hernia    There were no vitals filed for this visit.  Subjective Assessment - 04/22/17 1109    Subjective  Just  feeling a little tired today from working yesterday ( 8 hr shift). Mostly just feel numb at incision area.     Pertinent History  Patient was diagnosed on 02/13/17 with left grade 2 invasive ductal carcinoma breast cancer. It was located in the upper inner quadrant. It is ER/PR positive and HER2 negative with a Ki67 of 25%. She had a left lumpectomy and sentinel node biopsy (0/3 nodes) on 03/21/17.    Patient Stated Goals  See if arm is ok    Currently in Pain?  No/denies            LYMPHEDEMA/ONCOLOGY QUESTIONNAIRE - 04/21/17 1125      Type   Cancer Type  Left breast      Surgeries   Lumpectomy Date  03/21/17    Sentinel Lymph Node Biopsy Date  03/21/17    Number Lymph Nodes Removed  3      Treatment   Active Chemotherapy Treatment  No    Past Chemotherapy Treatment  No    Active Radiation Treatment  No    Past Radiation Treatment  No    Current Hormone Treatment  No    Past Hormone Therapy  No      What other symptoms do you have   Are you Having Heaviness or Tightness  No  Are you having Pain  Yes    Are you having pitting edema  No    Is it Hard or Difficult finding clothes that fit  No    Do you have infections  No    Is there Decreased scar mobility  Yes    Stemmer Sign  No      Lymphedema Assessments   Lymphedema Assessments  Upper extremities      Right Upper Extremity Lymphedema   10 cm Proximal to Olecranon Process  36.5 cm    Olecranon Process  28.3 cm    10 cm Proximal to Ulnar Styloid Process  23.1 cm    Just Proximal to Ulnar Styloid Process  17.5 cm    Across Hand at [employer]  20.9 cm    At [place] of 2nd Digit  6.9 cm      Left Upper Extremity Lymphedema   10 cm Proximal to Olecranon Process  36.2 cm    Olecranon Process  27.9 cm    10 cm Proximal to Ulnar Styloid Process  22.8 cm    Just Proximal to Ulnar Styloid Process  17.2 cm    Across Hand at [employer]  20 cm    At [place] of 2nd Digit  7 cm         No data  recorded       [facility] Adult PT Treatment/Exercise - 04/22/17 0001      Manual Therapy   Manual Therapy  Manual Lymphatic Drainage (MLD);Myofascial release;Passive ROM    Myofascial Release  To Lt axilla during P/ROM    Manual Lymphatic Drainage (MLD)  In Supine with HOB slightly elevated: Short neck, superficial and deep abdominals, Lt inguinal and Rt axillary nodes and intact upper quadrant sequence, Lt axillo-ignuinal and anterior inter-axilary anastomosis then focused on superior to breast incision swelling, then into Rt S/L for furhter work to breast near axilla along Lt axillo-inguinal and posterior inter-axillary anastomosis.    Passive ROM  To Lt shoulder to pts tolerance into flexion, abduction and er             PT Education - 04/22/17 1157    Education provided  Yes    Education Details  Supine dowel exercises    Person(s) Educated  Patient    Methods  Explanation;Demonstration;Handout    Comprehension  Verbalized understanding;Need further instruction          PT Long Term Goals - 04/21/17 1158      PT LONG TERM GOAL #1   Title  Patient will demonstrate she has returned to baseline related to shoulder ROM and function post operatively.    Time  4    Period  Weeks    Status  On-going      PT LONG TERM GOAL #2   Title  Patient will improve her DASH score to be </= 8 for improved overall shoulder function.    Baseline  15.91    Time  4    Period  Weeks    Status  New      PT LONG TERM GOAL #3   Title  Increase left shoulder abduction active ROM to >/= 155 for increase ease with reaching and to return to pre-op baseline measurement.    Baseline  139 degrees post operatively    Time  4    Period  Weeks    Status  New      PT LONG TERM GOAL #4  Title  Patient will verbalize understanding of risk reduction practices.    Time  4    Period  Weeks    Status  New           Plan - 04/22/17 1205    Clinical Impression Statement  Pt tolerated first  session of manual lymph drainage (MLD) and P/ROM well. Educated her in basics of anatomy of lymphatic system and began instructing her in sequencing of MLD which she verbalized good understanding of asking questions throughout. Briefly instructed pt through demonstration at end of session in supine dowel exercises throughou demonstration only and to continue HEP stretches issued to her at [facility].     Rehab Potential  Excellent    Clinical Impairments Affecting Rehab Potential  None    PT Frequency  2x / week    PT Duration  4 weeks    PT Treatment/Interventions  ADLs/Self Care Home Management;Therapeutic exercise;Patient/family education;Therapeutic activities;Manual techniques;Passive range of motion;Scar mobilization;Manual lymph drainage    PT Next Visit Plan  Manual lymph drainage focues on left superior breast; PROM to left shoulder to reduce axillary tightness; also review HEP issued today assessing technique    Consulted and Agree with Plan of Care  Patient       Patient will benefit from skilled therapeutic intervention in order to improve the following deficits and impairments:  Decreased knowledge of precautions, Decreased range of motion, Impaired UE functional use, Postural dysfunction, Pain, Increased edema, Decreased scar mobility  Visit Diagnosis: Abnormal posture  Stiffness of left shoulder, not elsewhere classified  Localized edema     Problem List Patient Active Problem List   Diagnosis Date Noted  . Genetic testing 03/18/2017  . Family history of breast cancer   . Family history of pancreatic cancer   . Malignant neoplasm of upper-inner quadrant of left breast in female, estrogen receptor positive ([place]) 03/04/2017  . SVT (supraventricular tachycardia) ([place]) 03/18/2011  . Hypokalemia 03/18/2011  . Diabetes mellitus 03/18/2011  . HYPERLIPIDEMIA 01/30/2007  . HYPERTENSION 01/30/2007  . ALLERGIC RHINITIS 01/30/2007    Otelia Limes, PTA 04/22/2017, 12:24  PM  [place], [place], [address] Phone: [phone]   Fax:  [phone]  Name: Latonyia Lopata MRN: [id number] Date of Birth: August 26, 1955

## 2017-04-22 NOTE — Patient Instructions (Signed)

## 2017-04-23 ENCOUNTER — Encounter: Payer: Self-pay | Admitting: *Deleted

## 2017-04-23 NOTE — Progress Notes (Signed)
On 04-23-17 fmla paper work came in from [employer] , given to [employer]

## 2017-04-25 ENCOUNTER — Telehealth: Payer: Self-pay | Admitting: Hematology and Oncology

## 2017-04-25 NOTE — Telephone Encounter (Signed)
04/25/17 @ 2:15 pm, left voicemail informing patient that the FMLA forms have been successfully faxed to [employer] at [phone]. Also mailed a copy to the patient.

## 2017-04-28 NOTE — Progress Notes (Signed)
Location of Breast Cancer:Malignant neoplasm of upper-inner quadrant of left breast in female    Histology per Pathology Report:  PROGNOSTIC INDICATORS Results: IMMUNOHISTOCHEMICAL AND MORPHOMETRIC ANALYSIS PERFORMED MANUALLY Estrogen Receptor: 100%, POSITIVE, STRONG STAINING INTENSITY Progesterone Receptor: 20%, POSITIVE, STRONG STAINING INTENSITY Proliferation Marker Ki67: 25% REFERENCE RANGE ESTROGEN RECEPTOR NEGATIVE 0% POSITIVE =>1% REFERENCE RANGE PROGESTERONE RECEPTOR NEGATIVE 0% POSITIVE =>1% All controls stained appropriately JULIA  FLUORESCENCE IN-SITU HYBRIDIZATION Results: HER2 - NEGATIVE RATIO OF HER2/CEP17 SIGNALS 1.43 AVERAGE HER2 COPY NUMBER PER CELL 3.65 Reference Range: NEGATIVE HER2/CEP17 Ratio <2.0 and average HER2 copy number <4.0 EQUIVOCAL HER2/CEP17 Ratio <2.0 and average HER2 copy number >=4.0 and <6.0 1 of 3  ADDITIONAL INFORMATION:(continued) POSITIVE HER2/CEP17 Ratio >=2.0 or <2.0 and average HER2 copy number   Diagnosis 02-25-17 Breast, left, needle core biopsy, UIQ, 11:30 1 cmfn - INVASIVE DUCTAL CARCINOMA, INTERMEDIATE NUCLEAR GRADE, ARISING IN ASSOCIATION WITH 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 SEE NOTE. Diagnosis Note The longest contiguous focus of carcinoma measures 9 mm in the submitted biopsies. Prognostic markers for breast carcinoma are pending and will be reported in an addendum. [facility]   Receptor Status: ER(100 % +), PR (20 % +), Her2-neu (-), Ki-(25 %)  Did patient present with symptoms (if so, please note symptoms) or was this found on screening mammography?: Screening detected abnormality in the left breast   Past/Anticipated interventions by surgeon, if any:  1. Breast, lumpectomy, Left 03-21-17  - INVASIVE DUCTAL CARCINOMA, NOTTINGHAM GRADE 2 OF 3, 1.5 CM - DUCTAL CARCINOMA IN SITU, INTERMEDIATE NUCLEAR GRADE - MARGINS UNINVOLVED BY CARCINOMA (0.2 CM MEDIAL  MARGIN) - INTRADUCTAL PAPILLOMA AND FIBROCYSTIC CHANGES INCLUDING APOCRINE METAPLASIA - PREVIOUS BIOPSY SITE CHANGES - SEE ONCOLOGY TABLE BELOW 2. Breast, excision, Left additional Superior Margin - FIBROCYSTIC CHANGES INCLUDING APOCRINE METAPLASIA - NO RESIDUAL CARCINOMA IDENTIFIED 3. Lymph node, sentinel, biopsy, Left Axillary #1 - NO CARCINOMA IDENTIFIED IN ONE LYMPH NODE (0/1) 4. Lymph node, sentinel, biopsy, Left Axillary #2 - NO CARCINOMA IDENTIFIED IN ONE LYMPH NODE (0/1) 5. Lymph node, sentinel, biopsy, Left Axillary #3 - NO CARCINOMA IDENTIFIED IN ONE LYMPH NODE (0/1)  Receptor Status: ER(100 % +), PR (20 % +), Her2-neu (-), Ki-(25 %)    Past/Anticipated interventions by medical oncology, if any: Chemotherapy No  03-17-17 Oncotype DX testing Negative genetic testing on the 9-gene STAT panel Adjuvant radiation therapy followed by Adjuvant antiestrogen therapy  Lymphedema issues, if any:  Yes receiving PT twice a week started last week  ROM to left arm good.     Skin to left breast healing well no signs of infection.      Follow up with Dr. Fanny Skates 04-17-17 will see again in 6 months. Pain issues, if any:  No  SAFETY ISSUES:  Prior radiation? :No  Pacemaker/ICD? : No  Possible current pregnancy? : No  Is the patient on methotrexate?:No   Menarche 10 G3 P3 Menopause had hysterectomy 50 HRT No   Current Complaints / other details:      Wt Readings from Last 3 Encounters:  04/29/17 282 lb (127.9 kg)  04/14/17 272 lb (123.4 kg)  03/31/17 272 lb 1.6 oz (123.4 kg)  BP (!) 152/83 (BP Location: Right Arm, Patient Position: Sitting, Cuff Size: Normal)   Pulse 73   Temp 98.1 F (36.7 C) (Oral)   Resp 20   Ht _0  (1.702 m)   Wt 282 lb (127.9 kg)   SpO2 100%  BMI 44.17 kg/m  Georgena Spurling, RN 04/28/2017,12:49 PM

## 2017-04-29 ENCOUNTER — Ambulatory Visit
Admission: RE | Admit: 2017-04-29 | Discharge: 2017-04-29 | Disposition: A | Discharge status: Home / Self Care | Payer: 59 | Source: Ambulatory Visit | Attending: Radiation Oncology | Admitting: Radiation Oncology

## 2017-04-29 ENCOUNTER — Ambulatory Visit: Payer: 59

## 2017-04-29 ENCOUNTER — Other Ambulatory Visit: Payer: Self-pay

## 2017-04-29 ENCOUNTER — Encounter: Payer: Self-pay | Admitting: Radiation Oncology

## 2017-04-29 VITALS — BP 152/83 | HR 73 | Temp 98.1°F | Resp 20 | Ht 67.0 in | Wt 282.0 lb

## 2017-04-29 DIAGNOSIS — Z79899 Other long term (current) drug therapy: Secondary | ICD-10-CM | POA: Insufficient documentation

## 2017-04-29 DIAGNOSIS — Z8249 Family history of ischemic heart disease and other diseases of the circulatory system: Secondary | ICD-10-CM | POA: Diagnosis not present

## 2017-04-29 DIAGNOSIS — Z803 Family history of malignant neoplasm of breast: Secondary | ICD-10-CM | POA: Insufficient documentation

## 2017-04-29 DIAGNOSIS — Z7982 Long term (current) use of aspirin: Secondary | ICD-10-CM | POA: Insufficient documentation

## 2017-04-29 DIAGNOSIS — C50212 Malignant neoplasm of upper-inner quadrant of left female breast: Secondary | ICD-10-CM | POA: Diagnosis not present

## 2017-04-29 DIAGNOSIS — Z807 Family history of other malignant neoplasms of lymphoid, hematopoietic and related tissues: Secondary | ICD-10-CM | POA: Insufficient documentation

## 2017-04-29 DIAGNOSIS — Z9889 Other specified postprocedural states: Secondary | ICD-10-CM | POA: Insufficient documentation

## 2017-04-29 DIAGNOSIS — Z90710 Acquired absence of both cervix and uterus: Secondary | ICD-10-CM | POA: Insufficient documentation

## 2017-04-29 DIAGNOSIS — Z170 Estrogen receptor positive status [ER+]: Secondary | ICD-10-CM

## 2017-04-29 DIAGNOSIS — Z79891 Long term (current) use of opiate analgesic: Secondary | ICD-10-CM | POA: Insufficient documentation

## 2017-04-29 DIAGNOSIS — Z800 Family history of malignant neoplasm of digestive organs: Secondary | ICD-10-CM | POA: Insufficient documentation

## 2017-04-29 DIAGNOSIS — E119 Type 2 diabetes mellitus without complications: Secondary | ICD-10-CM | POA: Insufficient documentation

## 2017-04-29 DIAGNOSIS — Z7984 Long term (current) use of oral hypoglycemic drugs: Secondary | ICD-10-CM | POA: Diagnosis not present

## 2017-04-29 DIAGNOSIS — Z888 Allergy status to other drugs, medicaments and biological substances status: Secondary | ICD-10-CM | POA: Diagnosis not present

## 2017-04-29 NOTE — Progress Notes (Signed)
Radiation Oncology         [phone] ________________________________  Name: Melody Zhang        MRN: [id number]  Date of Service: 04/29/2017 DOB: Sep 07, 1955  [id number], Herbie Baltimore, MD  Nicholas Lose, MD     REFERRING PHYSICIAN: Nicholas Lose, MD   DIAGNOSIS: The encounter diagnosis was Malignant neoplasm of upper-inner quadrant of left breast in female, estrogen receptor positive ([place]).   HISTORY OF PRESENT ILLNESS: Melody Zhang is a 62 y.o. female who was originally seen in the multidisciplinary breast clinic for a new diagnosis of left breast cancer. The patient was noted to have screening detected abnormality in the left breast. Diagnostic imaging on 02/18/16 revealed a 1.9 x 1.4 x .9 cm mass at 11:30 location the axilla was negative for adenopathy, and biopsy on 02/24/17 revealed a grade 2 invasive ductal carcinoma with DCIS, ER/PR positive, HER2 negative, with a ki67 of 25%. She underwent left lumpectomy and sentinel node biopsy on 03/21/17. Final pathology revealed a grade 2, invasive ductal carcinoma with intermediate grade DCIS, and intraductal papilloma and fibrocystic changes and margins were negative, as well as 3 nodes that were also negative for disease. Her oncotype dx score was 20, and she will not receive chemotherapy. She comes today to discuss adjuvant radiotherapy.   PREVIOUS RADIATION THERAPY: No   PAST MEDICAL HISTORY:  Past Medical History:  Diagnosis Date  . Cancer [facility])    breast cancer  . Diabetes mellitus   . Family history of adverse reaction to anesthesia    Brother's heart stopped during surgery on urethra at [facility]  . Family history of breast cancer   . Family history of pancreatic cancer   . GERD (gastroesophageal reflux disease)    not on medication for this, TUMS PRN  . Heart murmur   . Hyperlipemia   . Hypertension   . Intermittent palpitations    once a month  . Paroxysmal SVT (supraventricular tachycardia) (HCC)    Myoview was performed  03/26/11: Low risk with small partially reversible apical perfusion defect likely breast attenuation, no ischemia, no scar, EF 62%.   . Sleep apnea    CPAP       PAST SURGICAL HISTORY: Past Surgical History:  Procedure Laterality Date  . ABDOMINAL HYSTERECTOMY    . BREAST LUMPECTOMY WITH RADIOACTIVE SEED AND SENTINEL LYMPH NODE BIOPSY Left 03/21/2017   Procedure: LEFT BREAST LUMPECTOMY WITH RADIOACTIVE SEED AND LEFT AXILLARY DEEP LYMPH NODE BIOPSY INJECTION BLUE DYE LEFT BREAST ERAS PATHWAY;  Surgeon: Fanny Skates, MD;  Location: [place];  Service: General;  Laterality: Left;  . HERNIA REPAIR     umbilical hernia     FAMILY HISTORY:  Family History  Problem Relation Age of Onset  . Healthy Mother        There is no family history of arrythmia  . Multiple myeloma Mother 70  . Heart attack Father 40       There is no family history of arrythmia  . Lymphoma Maternal Aunt   . Liver cancer Maternal Uncle   . Pancreatic cancer Maternal Grandmother 63  . Pancreatic cancer Cousin        paternal first cousin  . Breast cancer Other        mat great aunt     SOCIAL HISTORY:  reports that she has never smoked. She has never used smokeless tobacco. She reports that she does not drink alcohol or use drugs. the patient is divorced and lives in  [place]. 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   ALLERGIES: Dobutamine and Lisinopril   MEDICATIONS:  Current Outpatient Medications  Medication Sig Dispense Refill  . aspirin 81 MG EC tablet Take 81 mg by mouth every evening.    . calcium carbonate (TUMS - DOSED IN MG ELEMENTAL CALCIUM) 500 MG chewable tablet Chew 2 tablets by mouth 3 (three) times daily as needed for indigestion or heartburn.    . diltiazem (CARDIZEM) 120 MG tablet Take 120 mg by mouth every evening.  0  . ibuprofen (ADVIL,MOTRIN) 200 MG tablet Take 800 mg by mouth every 8 (eight) hours as needed (for pain.).    Marland Kitchen KLOR-CON M20 20 MEQ tablet TAKE ONE  TABLET BY MOUTH EVERY DAY (Patient taking differently: TAKE ONE TABLET BY MOUTH EVERY DAY IN THE EVENING.) 15 tablet 0  . losartan-hydrochlorothiazide (HYZAAR) 100-25 MG tablet Take 1 tablet by mouth every evening.  1  . metFORMIN (GLUCOPHAGE-XR) 750 MG 24 hr tablet Take 750 mg by mouth every evening.  0  . Probiotic Product (ACIDOPHILUS/BIFIDUS PO) Take 1 capsule by mouth every evening.     . rosuvastatin (CRESTOR) 20 MG tablet Take 20 mg by mouth every evening.    . vitamin B-12 (CYANOCOBALAMIN) 500 MCG tablet Take 1,000 mcg by mouth every evening.     . vitamin C (ASCORBIC ACID) 500 MG tablet Take 500 mg by mouth every evening.     . ferrous sulfate 325 (65 FE) MG tablet Take 325 mg by mouth every evening.    Marland Kitchen HYDROcodone-acetaminophen (NORCO) 5-325 MG tablet Take 1-2 tablets by mouth every 6 (six) hours as needed for moderate pain or severe pain. (Patient not taking: Reported on 04/29/2017) 20 tablet 0  . nitroGLYCERIN (NITROSTAT) 0.4 MG SL tablet Place 1 tablet (0.4 mg total) under the tongue every 5 (five) minutes x 3 doses as needed for chest pain. (Patient not taking: Reported on 04/29/2017) 25 tablet 3   No current facility-administered medications for this encounter.      REVIEW OF SYSTEMS: On review of systems, the patient reports that she is doing well overall. She denies any chest pain, shortness of breath, cough, fevers, chills, night sweats, unintended weight changes. She denies any bowel or bladder disturbances, and denies abdominal pain, nausea or vomiting. She denies any new musculoskeletal or joint aches or pains. A complete review of systems is obtained and is otherwise negative.     PHYSICAL EXAM:  Wt Readings from Last 3 Encounters:  04/29/17 282 lb (127.9 kg)  04/14/17 272 lb (123.4 kg)  03/31/17 272 lb 1.6 oz (123.4 kg)   Temp Readings from Last 3 Encounters:  04/29/17 98.1 F (36.7 C) (Oral)  03/31/17 98.4 F (36.9 C) (Oral)  03/21/17 97.9 F (36.6 C)   BP  Readings from Last 3 Encounters:  04/29/17 (!) 152/83  03/31/17 126/71  03/21/17 132/78   Pulse Readings from Last 3 Encounters:  04/29/17 73  03/31/17 89  03/21/17 72     In general this is a well appearing African American female in no acute distress. She is alert and oriented x4 and appropriate throughout the examination. HEENT reveals that the patient is normocephalic, atraumatic. EOMs are intact. PERRLA. Skin is intact without any evidence of gross lesions. Cardiopulmonary assessment is negative for acute distress and she exhibits normal effort. The left breast and axillary incision site are intact without erythema. No fullness is noted either along the breast.  ECOG = 0  0 - Asymptomatic (Fully active, able to carry on all predisease activities without restriction)  1 - Symptomatic but completely ambulatory (Restricted in physically strenuous activity but ambulatory and able to carry out work of a light or sedentary nature. For example, light housework, office work)  2 - Symptomatic, <50% in bed during the day (Ambulatory and capable of all self care but unable to carry out any work activities. Up and about more than 50% of waking hours)  3 - Symptomatic, >50% in bed, but not bedbound (Capable of only limited self-care, confined to bed or chair 50% or more of waking hours)  4 - Bedbound (Completely disabled. Cannot carry on any self-care. Totally confined to bed or chair)  5 - Death   Eustace Pen MM, Creech RH, Tormey DC, et al. [phone]). "Toxicity and response criteria of the [facility]". [place] Oncol. 5 (6): 649-55    LABORATORY DATA:  Lab Results  Component Value Date   WBC 7.4 03/18/2017   HGB 12.0 03/18/2017   HCT 38.1 03/18/2017   MCV 81.8 03/18/2017   PLT 280 03/18/2017   Lab Results  Component Value Date   NA 140 03/18/2017   K 3.5 03/18/2017   CL 102 03/18/2017   CO2 26 03/18/2017   Lab Results  Component Value Date   ALT 13  03/05/2017   AST 14 03/05/2017   ALKPHOS 73 03/05/2017   BILITOT 0.3 03/05/2017      RADIOGRAPHY: No results found.     IMPRESSION/PLAN: 1. 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 Dr. Lisbeth Renshaw reviewed the findings from her pathology and the rationale to proceed with radiotherapy. Her oncotype Dx score does not warrant chemotherapy and she is ready to proceed with radiotherapy. We discussed the risks, benefits, short, and long term effects of radiotherapy, and the patient is interested in proceeding. Dr. Lisbeth Renshaw discusses the delivery and logistics of radiotherapy and recommends 4 weeks of treatment with deep inspiration breath hold technique. Written consent is obtained and placed in the chart, a copy was provided to the patient. She will simulate later this week either tomorrow or Thursday.   In a visit lasting 25 minutes, greater than 50% of the time was spent face to face discussing her case, and coordinating the patient's care.  The above documentation reflects my direct findings during this shared patient visit. Please see the separate note by Dr. Lisbeth Renshaw on this date for the remainder of the patient's plan of care.    Carola Rhine, PAC

## 2017-04-29 NOTE — Addendum Note (Signed)
Encounter addended by: Malena Edman, RN on: 04/29/2017 2:41 PM  Actions taken: Charge Capture section accepted

## 2017-04-30 ENCOUNTER — Encounter: Payer: Self-pay | Admitting: General Practice

## 2017-04-30 ENCOUNTER — Ambulatory Visit: Payer: 59 | Attending: General Surgery

## 2017-04-30 DIAGNOSIS — R600 Localized edema: Secondary | ICD-10-CM | POA: Diagnosis present

## 2017-04-30 DIAGNOSIS — R293 Abnormal posture: Secondary | ICD-10-CM | POA: Diagnosis present

## 2017-04-30 DIAGNOSIS — M25612 Stiffness of left shoulder, not elsewhere classified: Secondary | ICD-10-CM | POA: Diagnosis present

## 2017-04-30 NOTE — Progress Notes (Signed)
[place] CSW Progress Note  CSW spoke w patient to discuss Distress Screen, patient at work, requested call back tomorrow morning.  Edwyna Shell, LCSW Clinical Social Worker Phone:  [phone]

## 2017-04-30 NOTE — Patient Instructions (Addendum)
Self manual lymph drainage: Perform this sequence once a day.  Only give enough pressure no your skin to make the skin move.  Diaphragmatic - Supine   Inhale through nose making navel move out toward hands. Exhale through puckered lips, hands follow navel in. Repeat _5__ times. Rest _10__ seconds between repeats.   Copyright  VHI. All rights reserved.  Hug yourself.  Do circles at your neck just above your collarbones.  Repeat this 10 times.  Axilla - One at a Time   Using full weight of flat hand and fingers at center of uninvolved armpit, make _10__ in-place circles.   Copyright  VHI. All rights reserved.  LEG: Inguinal Nodes Stimulation   With small finger side of hand against hip crease on involved side, gently perform circles at the crease. Repeat __10_ times.   Copyright  VHI. All rights reserved.  1) Axilla to Inguinal Nodes - Sweep   On involved side, sweep _4__ times from armpit along side of trunk to hip crease.  Now gently stretch skin from the involved side to the uninvolved side across the chest at the shoulder line.  Repeat that 4 times.  Draw an imaginary diagonal line from upper outer breast through the nipple area toward lower inner breast.  Direct fluid upward and inward from this line toward the pathway across your upper chest .  Do this in three rows to treat all of the upper inner breast tissue, and do each row 3-4x.      Direct fluid to treat all of lower outer breast tissue downward and outward toward      pathway that is aimed at the left groin.  Finish by doing the pathways as described above going from your involved armpit to the same side groin and going across your upper chest from the involved shoulder to the uninvolved shoulder.  Repeat the steps above where you do circles in your left groin and right armpit. 

## 2017-04-30 NOTE — Therapy (Signed)
[place], [place], [address] Phone: [phone]   Fax:  [phone]  Physical Therapy Treatment  Patient Details  Name: Melody Zhang MRN: [id number] Date of Birth: 03/12/1955 Referring Provider: Dr. Fanny Skates   Encounter Date: 04/30/2017  PT End of Session - 04/30/17 1205    Visit Number  4    Number of Visits  10    Date for PT Re-Evaluation  05/19/17    PT Start Time  1053    PT Stop Time  1155    PT Time Calculation (min)  62 min    Activity Tolerance  Patient tolerated treatment well    Behavior During Therapy  [facility] for tasks assessed/performed       Past Medical History:  Diagnosis Date  . Cancer [facility])    breast cancer  . Diabetes mellitus   . Family history of adverse reaction to anesthesia    Brother's heart stopped during surgery on urethra at [facility]  . Family history of breast cancer   . Family history of pancreatic cancer   . GERD (gastroesophageal reflux disease)    not on medication for this, TUMS PRN  . Heart murmur   . Hyperlipemia   . Hypertension   . Intermittent palpitations    once a month  . Paroxysmal SVT (supraventricular tachycardia) (HCC)    Myoview was performed 03/26/11: Low risk with small partially reversible apical perfusion defect likely breast attenuation, no ischemia, no scar, EF 62%.   . Sleep apnea    CPAP    Past Surgical History:  Procedure Laterality Date  . ABDOMINAL HYSTERECTOMY    . BREAST LUMPECTOMY WITH RADIOACTIVE SEED AND SENTINEL LYMPH NODE BIOPSY Left 03/21/2017   Procedure: LEFT BREAST LUMPECTOMY WITH RADIOACTIVE SEED AND LEFT AXILLARY DEEP LYMPH NODE BIOPSY INJECTION BLUE DYE LEFT BREAST ERAS PATHWAY;  Surgeon: Fanny Skates, MD;  Location: [place];  Service: General;  Laterality: Left;  . HERNIA REPAIR     umbilical hernia    There were no vitals filed for this visit.  Subjective Assessment - 04/30/17 1058    Subjective  Work is  going well, my Lt shoulder isn't preventing me from doing anything at work now, even able to reach the higher shelves. The breast swelling still comes and goes, especially after work it's worse.    Pertinent History  Patient was diagnosed on 02/13/17 with left grade 2 invasive ductal carcinoma breast cancer. It was located in the upper inner quadrant. It is ER/PR positive and HER2 negative with a Ki67 of 25%. She had a left lumpectomy and sentinel node biopsy (0/3 nodes) on 03/21/17.    Patient Stated Goals  See if arm is ok    Currently in Pain?  No/denies                       [facility] Adult PT Treatment/Exercise - 04/30/17 0001      Shoulder Exercises: Pulleys   Flexion  2 minutes    Flexion Limitations  Demonstrated technique, then pt returned correct demo    ABduction  2 minutes    ABduction Limitations  Demonstrated then pt returned demo      Shoulder Exercises: Therapy Ball   Flexion  Both;10 reps with forward lean into end of stretch    ABduction  Left;5 reps Same side lean into end of stretch      Shoulder Exercises: IT sales professional  3 reps;10 seconds      Manual Therapy   Manual Therapy  Manual Lymphatic Drainage (MLD);Myofascial release;Passive ROM;Soft tissue mobilization    Soft tissue mobilization  With lotion after MLD to posterior axilla at area of tightness; trigger point palpated here with pt report of tenderness    Myofascial Release  To Lt axilla during P/ROM    Manual Lymphatic Drainage (MLD)  In Supine with HOB slightly elevated: Short neck, 5 diaphragmatic breaths, Lt inguinal and Rt axillary nodes, Lt axillo-inguinal and anterior inter-axillary anastomosis then focused on superior to breast incision swelling, then into Rt S/L for further work to breast near axilla along Lt axillo-inguinal and posterior inter-axillary anastomosis; instructed pt in this throughout today and had her return demo at each sequence, also issued handout.    Passive ROM   To Lt shoulder to pts tolerance into flexion and abduction             PT Education - 04/30/17 1108    Education provided  Yes    Education Details  Self manual lymph drainage    Person(s) Educated  Patient    Methods  Explanation;Demonstration;Handout    Comprehension  Returned demonstration;Verbalized understanding;Need further instruction          PT Long Term Goals - 04/21/17 1158      PT LONG TERM GOAL #1   Title  Patient will demonstrate she has returned to baseline related to shoulder ROM and function post operatively.    Time  4    Period  Weeks    Status  On-going      PT LONG TERM GOAL #2   Title  Patient will improve her DASH score to be </= 8 for improved overall shoulder function.    Baseline  15.91    Time  4    Period  Weeks    Status  New      PT LONG TERM GOAL #3   Title  Increase left shoulder abduction active ROM to >/= 155 for increase ease with reaching and to return to pre-op baseline measurement.    Baseline  139 degrees post operatively    Time  4    Period  Weeks    Status  New      PT LONG TERM GOAL #4   Title  Patient will verbalize understanding of risk reduction practices.    Time  4    Period  Weeks    Status  New           Plan - 04/30/17 1205    Clinical Impression Statement  Progressed pt today to include AA/ROM stretching with pulleys and ball up wall. She tolerated this well. Instructed pt, while performing, self manual lymph drainage. Had her return demonstration at each sequence which involved hand over hand pressure to teach correct pressure and to not slide on skin. She was able to return good demonstration but will need follow up review. Also continued with manual therapy for P/ROM of Lt shoulder and soft tissue with gentle trigger point release at posterior axilla, pt was very tender here.    Rehab Potential  Excellent    Clinical Impairments Affecting Rehab Potential  None    PT Frequency  2x / week    PT Duration   4 weeks    PT Treatment/Interventions  ADLs/Self Care Home Management;Therapeutic exercise;Patient/family education;Therapeutic activities;Manual techniques;Passive range of motion;Scar mobilization;Manual lymph drainage    PT Next Visit Plan  Issue  order for compression bra to pt tomorrow to take to radiation simulation for signature; Cont and review, having pt perform, manual lymph drainage focusing on left superior breast; PROM to left shoulder to reduce axillary tightness; also review HEP to assess technique    PT Home Exercise Plan  Post op shoulder ROM HEP; supine dowel exercises    Consulted and Agree with Plan of Care  Patient       Patient will benefit from skilled therapeutic intervention in order to improve the following deficits and impairments:  Decreased knowledge of precautions, Decreased range of motion, Impaired UE functional use, Postural dysfunction, Pain, Increased edema, Decreased scar mobility  Visit Diagnosis: Abnormal posture  Stiffness of left shoulder, not elsewhere classified  Localized edema     Problem List Patient Active Problem List   Diagnosis Date Noted  . Genetic testing 03/18/2017  . Family history of breast cancer   . Family history of pancreatic cancer   . Malignant neoplasm of upper-inner quadrant of left breast in female, estrogen receptor positive ([place]) 03/04/2017  . SVT (supraventricular tachycardia) ([place]) 03/18/2011  . Hypokalemia 03/18/2011  . Diabetes mellitus 03/18/2011  . HYPERLIPIDEMIA 01/30/2007  . HYPERTENSION 01/30/2007  . ALLERGIC RHINITIS 01/30/2007    Otelia Limes, PTA 04/30/2017, 12:13 PM  [place], [place], [address] Phone: [phone]   Fax:  [phone]  Name: Melody Zhang MRN: [id number] Date of Birth: 1955/12/30

## 2017-05-01 ENCOUNTER — Telehealth: Payer: Self-pay | Admitting: Hematology and Oncology

## 2017-05-01 ENCOUNTER — Ambulatory Visit
Admission: RE | Admit: 2017-05-01 | Discharge: 2017-05-01 | Disposition: A | Discharge status: Home / Self Care | Payer: 59 | Source: Ambulatory Visit | Attending: Radiation Oncology | Admitting: Radiation Oncology

## 2017-05-01 ENCOUNTER — Ambulatory Visit: Payer: 59

## 2017-05-01 DIAGNOSIS — M25612 Stiffness of left shoulder, not elsewhere classified: Secondary | ICD-10-CM

## 2017-05-01 DIAGNOSIS — R600 Localized edema: Secondary | ICD-10-CM

## 2017-05-01 DIAGNOSIS — Z510 Encounter for antineoplastic radiation therapy: Secondary | ICD-10-CM | POA: Diagnosis not present

## 2017-05-01 DIAGNOSIS — C50212 Malignant neoplasm of upper-inner quadrant of left female breast: Secondary | ICD-10-CM | POA: Insufficient documentation

## 2017-05-01 DIAGNOSIS — R293 Abnormal posture: Secondary | ICD-10-CM

## 2017-05-01 DIAGNOSIS — Z170 Estrogen receptor positive status [ER+]: Secondary | ICD-10-CM | POA: Insufficient documentation

## 2017-05-01 NOTE — Telephone Encounter (Signed)
05/01/2017 @ 10:43 am, spoke with patient and informed FMLA forms have been successfully faxed to [phone]. Patient requested to pick up a copy at front desk when she comes to her appointment tomorrow.

## 2017-05-01 NOTE — Therapy (Signed)
[place], [place], [address] Phone: [phone]   Fax:  [phone]  Physical Therapy Treatment  Patient Details  Name: Melody Zhang MRN: [id number] Date of Birth: 12-03-1955 Referring Provider: Dr. Fanny Skates   Encounter Date: 05/01/2017  PT End of Session - 05/01/17 0956    Visit Number  5    Number of Visits  10    Date for PT Re-Evaluation  05/19/17    PT Start Time  0850    PT Stop Time  0951    PT Time Calculation (min)  61 min    Activity Tolerance  Patient tolerated treatment well    Behavior During Therapy  [facility] for tasks assessed/performed       Past Medical History:  Diagnosis Date  . Cancer [facility])    breast cancer  . Diabetes mellitus   . Family history of adverse reaction to anesthesia    Brother's heart stopped during surgery on urethra at [facility]  . Family history of breast cancer   . Family history of pancreatic cancer   . GERD (gastroesophageal reflux disease)    not on medication for this, TUMS PRN  . Heart murmur   . Hyperlipemia   . Hypertension   . Intermittent palpitations    once a month  . Paroxysmal SVT (supraventricular tachycardia) (HCC)    Myoview was performed 03/26/11: Low risk with small partially reversible apical perfusion defect likely breast attenuation, no ischemia, no scar, EF 62%.   . Sleep apnea    CPAP    Past Surgical History:  Procedure Laterality Date  . ABDOMINAL HYSTERECTOMY    . BREAST LUMPECTOMY WITH RADIOACTIVE SEED AND SENTINEL LYMPH NODE BIOPSY Left 03/21/2017   Procedure: LEFT BREAST LUMPECTOMY WITH RADIOACTIVE SEED AND LEFT AXILLARY DEEP LYMPH NODE BIOPSY INJECTION BLUE DYE LEFT BREAST ERAS PATHWAY;  Surgeon: Fanny Skates, MD;  Location: [facility];  Service: General;  Laterality: Left;  . HERNIA REPAIR     umbilical hernia    There were no vitals filed for this visit.  Subjective Assessment - 05/01/17 0855    Subjective  I'm  pretty sore in my Lt posterior axilla from where you found that trigger point the other day. But other than that I'm feeling good today.      Pertinent History  Patient was diagnosed on 02/13/17 with left grade 2 invasive ductal carcinoma breast cancer. It was located in the upper inner quadrant. It is ER/PR positive and HER2 negative with a Ki67 of 25%. She had a left lumpectomy and sentinel node biopsy (0/3 nodes) on 03/21/17.    Patient Stated Goals  See if arm is ok    Currently in Pain?  No/denies         [facility] PT Assessment - 05/01/17 0001      AROM   Left Shoulder Flexion  156 Degrees    Left Shoulder ABduction  150 Degrees    Left Shoulder Internal Rotation  74 Degrees    Left Shoulder External Rotation  81 Degrees                   [facility] Adult PT Treatment/Exercise - 05/01/17 0001      Shoulder Exercises: Supine   External Rotation  AAROM;Left With dowel, 5 sec holds    External Rotation Limitations  Tactile cues and demonstration for correct technique    Flexion  AAROM;Both;5 reps with dowel , 5 sec holds  ABduction  AAROM;Left;5 reps With dowel, 5 sec holds    ABduction Limitations  VCs throughout to relax Lt shoulder/upper trap, also demo this for pt to do in standing at home as she relly had trouble relaxing.      Shoulder Exercises: Pulleys   Flexion  2 minutes Purple ball at back for support    Flexion Limitations  Brief VCs toremind pt of correct technique    ABduction  2 minutes      Shoulder Exercises: Therapy Ball   Flexion  Both;10 reps With forward and slight rotation to Rt at end ROM    ABduction  Left;10 reps Same side lean into end of stretch      Manual Therapy   Manual Therapy  Manual Lymphatic Drainage (MLD);Myofascial release;Passive ROM;Soft tissue mobilization    Soft tissue mobilization  Gently to upper traps and Lt posterior axilla as pt was still reporting mod-max tenderness here.     Myofascial Release  To Lt axilla during P/ROM     Manual Lymphatic Drainage (MLD)  In Supine with HOB slightly elevated: Short neck, 5 diaphragmatic breaths, Lt inguinal and Rt axillary nodes, Lt axillo-inguinal and anterior inter-axillary anastomosis then focused on superior to breast incision swelling and axilla with arm supported in OH position; reviewing with pt and had her return demo at each sequence.    Passive ROM  To Lt shoulder to pts tolerance into flexion and abduction stabilizing scapula into depression at end ROM's; also cervical Rt rotation and sidebend and suboccipt release as pt was reporting increased tension since getting work off last night at upper trap area             PT Education - 04/30/17 1108    Education provided  Yes    Education Details  Self manual lymph drainage    Person(s) Educated  Patient    Methods  Explanation;Demonstration;Handout    Comprehension  Returned demonstration;Verbalized understanding;Need further instruction          PT Long Term Goals - 04/21/17 1158      PT LONG TERM GOAL #1   Title  Patient will demonstrate she has returned to baseline related to shoulder ROM and function post operatively.    Time  4    Period  Weeks    Status  On-going      PT LONG TERM GOAL #2   Title  Patient will improve her DASH score to be </= 8 for improved overall shoulder function.    Baseline  15.91    Time  4    Period  Weeks    Status  New      PT LONG TERM GOAL #3   Title  Increase left shoulder abduction active ROM to >/= 155 for increase ease with reaching and to return to pre-op baseline measurement.    Baseline  139 degrees post operatively    Time  4    Period  Weeks    Status  New      PT LONG TERM GOAL #4   Title  Patient will verbalize understanding of risk reduction practices.    Time  4    Period  [facility] Goals - 03/05/17 1544      Patient will be able to verbalize understanding of pertinent lymphedema risk reduction practices relevant to  her diagnosis specifically related to skin care.   Time  1  Period  Days    Status  Achieved      Patient will be able to return demonstrate and/or verbalize understanding of the post-op home exercise program related to regaining shoulder range of motion.   Time  1    Period  Days    Status  Achieved      Patient will be able to verbalize understanding of the importance of attending the postoperative After Breast Cancer Class for further lymphedema risk reduction education and therapeutic exercise.   Time  1    Period  Days    Status  Achieved           Plan - 05/01/17 0956    Clinical Impression Statement  Continued AA/ROM stretching and reviewed supine dowel exercises. Pt required mod VCs to relax while stretching and was able to return correct demo but limited ability to stay relaxed. Instructed pt to work on this, especially throughout day as well. Reviewed self manual lymph drainage (MLD) with pt and she was able to return good demonstration with min VCs for lighter pressure but was performing good skin stretch. Included brief upper trap stretches along with soft tissue as pt was very uncomfortable in upper trap, she reports, since getting off worPt ters last night. By end of session pt reported Lt posterior axilla and upper trap area feeling much better, looser. Pt to cont stretching over weekend and practicing self MLD. Gets measured for a compression bra next  week, issued script to pt to take to have signed when at radiation simulation today.     Rehab Potential  Excellent    Clinical Impairments Affecting Rehab Potential  None    PT Frequency  2x / week    PT Duration  4 weeks    PT Treatment/Interventions  ADLs/Self Care Home Management;Therapeutic exercise;Patient/family education;Therapeutic activities;Manual techniques;Passive range of motion;Scar mobilization;Manual lymph drainage    PT Next Visit Plan  Instruct pt in lymphedema risk reduction practices for goal assess.  Cont and review, having pt perform, manual lymph drainage focusing on left superior breast and axilla; PROM to left shoulder to reduce axillary tightness and gentle soft tissue to posterior axilla where pt c/o tenderness.     Consulted and Agree with Plan of Care  Patient       Patient will benefit from skilled therapeutic intervention in order to improve the following deficits and impairments:  Decreased knowledge of precautions, Decreased range of motion, Impaired UE functional use, Postural dysfunction, Pain, Increased edema, Decreased scar mobility  Visit Diagnosis: Abnormal posture  Stiffness of left shoulder, not elsewhere classified  Localized edema     Problem List Patient Active Problem List   Diagnosis Date Noted  . Genetic testing 03/18/2017  . Family history of breast cancer   . Family history of pancreatic cancer   . Malignant neoplasm of upper-inner quadrant of left breast in female, estrogen receptor positive ([place]) 03/04/2017  . SVT (supraventricular tachycardia) ([place]) 03/18/2011  . Hypokalemia 03/18/2011  . Diabetes mellitus 03/18/2011  . HYPERLIPIDEMIA 01/30/2007  . HYPERTENSION 01/30/2007  . ALLERGIC RHINITIS 01/30/2007    Otelia Limes, PTA 05/01/2017, 10:05 AM  [place], [place], [address] Phone: [phone]   Fax:  [phone]  Name: Miyah Hampshire MRN: [id number] Date of Birth: August 09, 1955

## 2017-05-02 ENCOUNTER — Encounter: Payer: Self-pay | Admitting: General Practice

## 2017-05-02 ENCOUNTER — Ambulatory Visit: Payer: 59 | Admitting: Urology

## 2017-05-02 ENCOUNTER — Ambulatory Visit: Payer: 59

## 2017-05-02 NOTE — Progress Notes (Signed)
[facility] Psychosocial Distress Screening Clinical Social Work  Clinical Social Work was referred by distress screening protocol.  The patient scored a 8 on the Psychosocial Distress Thermometer which indicates moderate distress. Clinical Social Worker Edwyna Shell to assess for distress and other psychosocial needs. CSW and patient discussed common feeling and emotions when being diagnosed with cancer, and the importance of support during treatment. CSW informed patient of the support team and support services at [facility]. CSW provided contact information and encouraged patient to call with any questions or concerns.  Work colleagues have been very supportive and encouraging, feels well taken care of at work.  Lives w adult son, has daughter who lives an hour away.  Both are willing/able to provide help as needed.  Patient reports that anxiety has decreased now that she has a treatment plan and "I know what is going on."  CSW will mail information packet, encouraged patient to contact [facility] as needed.     ON[facility] DISTRESS SCREENING 04/29/2017  Screening Type Initial Screening  Distress experienced in past week (1-10) 8  Emotional problem type Nervousness/Anxiety  Spiritual/Religous concerns type   Information Concerns Type     Clinical Social Worker follow up needed: No.  If yes, follow up plan:   Edwyna Shell, [place] Phone:  [phone]

## 2017-05-05 NOTE — Progress Notes (Signed)
  Radiation Oncology         [phone] ________________________________  Name: Melody Zhang MRN: [id number]  Date: 05/01/2017  DOB: [birth year]   DIAGNOSIS:     ICD-10-CM   1. Malignant neoplasm of upper-inner quadrant of left breast in female, estrogen receptor positive ([place]) C50.212    Z17.0     SIMULATION AND TREATMENT PLANNING NOTE  The patient presented for simulation prior to beginning her course of radiation treatment for her diagnosis of left-sided breast cancer. The patient was placed in a supine position on a breast board. A customized vac-lock bag was constructed and this complex treatment device will be used on a daily basis during her treatment. In this fashion, a CT scan was obtained through the chest area and an isocenter was placed near the chest wall within the breast.  The patient will be planned to receive a course of radiation initially to a dose of 42.5 Gy. This will consist of a whole breast radiotherapy technique. To accomplish this, 2 customized blocks have been designed which will correspond to medial and lateral whole breast tangent fields. This treatment will be accomplished at 2.5 Gy per fraction. A forward planning technique will also be evaluated to determine if this approach improves the plan. It is anticipated that the patient will then receive a 7.5 Gy boost to the seroma cavity which has been contoured. This will be accomplished at 2.5 Gy per fraction.   This initial treatment will consist of a 3-D conformal technique. The seroma has been contoured as the primary target structure. Additionally, dose volume histograms of both this target as well as the lungs and heart will also be evaluated. Such an approach is necessary to ensure that the target area is adequately covered while the nearby critical  normal structures are adequately spared.  Plan:  The final anticipated total dose therefore will correspond to 50 Gy.   Special treatment procedure was  performed today due to the extra time and effort required by myself to plan and prepare this patient for deep inspiration breath hold technique.  I have determined cardiac sparing to be of benefit to this patient to prevent long term cardiac damage due to radiation of the heart.  Bellows were placed on the patient's abdomen. To facilitate cardiac sparing, the patient was coached by the radiation therapists on breath hold techniques and breathing practice was performed. Practice waveforms were obtained. The patient was then scanned while maintaining breath hold in the treatment position.  This image was then transferred over to the imaging specialist. The imaging specialist then created a fusion of the free breathing and breath hold scans using the chest wall as the stable structure. I personally reviewed the fusion in axial, coronal and sagittal image planes.  Excellent cardiac sparing was obtained.  I felt the patient is an appropriate candidate for breath hold and the patient will be treated as such.  The image fusion was then reviewed with the patient to reinforce the necessity of reproducible breath hold.  _______________________________   Jodelle Gross, MD, PhD

## 2017-05-05 NOTE — Progress Notes (Signed)
  Radiation Oncology         [phone] ________________________________  Name: Melody Zhang MRN: [id number]  Date: 05/01/2017  DOB: 1955-11-28  Optical Surface Tracking Plan:  Since intensity modulated radiotherapy (IMRT) and 3D conformal radiation treatment methods are predicated on accurate and precise positioning for treatment, intrafraction motion monitoring is medically necessary to ensure accurate and safe treatment delivery.  The ability to quantify intrafraction motion without excessive ionizing radiation dose can only be performed with optical surface tracking. Accordingly, surface imaging offers the opportunity to obtain 3D measurements of patient position throughout IMRT and 3D treatments without excessive radiation exposure.  I am ordering optical surface tracking for this patient's upcoming course of radiotherapy. ________________________________  Kyung Rudd, MD 05/05/2017 6:54 AM    Reference:   Particia Jasper, et al. Surface imaging-based analysis of intrafraction motion for breast radiotherapy patients.Journal of [place], n. 6, nov. 2014. ISSN [id number].   Available at: <http://www.jacmp.org/index.php/jacmp/article/view/4957>.

## 2017-05-06 ENCOUNTER — Telehealth: Payer: Self-pay | Admitting: Hematology and Oncology

## 2017-05-06 DIAGNOSIS — G4733 Obstructive sleep apnea (adult) (pediatric): Secondary | ICD-10-CM | POA: Diagnosis not present

## 2017-05-06 DIAGNOSIS — Z510 Encounter for antineoplastic radiation therapy: Secondary | ICD-10-CM | POA: Diagnosis not present

## 2017-05-06 DIAGNOSIS — Z170 Estrogen receptor positive status [ER+]: Secondary | ICD-10-CM | POA: Diagnosis not present

## 2017-05-06 DIAGNOSIS — C50212 Malignant neoplasm of upper-inner quadrant of left female breast: Secondary | ICD-10-CM | POA: Diagnosis not present

## 2017-05-06 NOTE — Telephone Encounter (Signed)
Mailed patient calendar of upcoming may appointments per 4/8 sch message

## 2017-05-07 ENCOUNTER — Encounter: Payer: Self-pay | Admitting: Physical Therapy

## 2017-05-07 ENCOUNTER — Ambulatory Visit: Payer: 59 | Admitting: Physical Therapy

## 2017-05-07 DIAGNOSIS — C50912 Malignant neoplasm of unspecified site of left female breast: Secondary | ICD-10-CM | POA: Diagnosis not present

## 2017-05-07 DIAGNOSIS — R600 Localized edema: Secondary | ICD-10-CM | POA: Diagnosis not present

## 2017-05-07 DIAGNOSIS — M25612 Stiffness of left shoulder, not elsewhere classified: Secondary | ICD-10-CM | POA: Diagnosis not present

## 2017-05-07 DIAGNOSIS — R293 Abnormal posture: Secondary | ICD-10-CM | POA: Diagnosis not present

## 2017-05-07 NOTE — Therapy (Signed)
[place], [place], [address] Phone: [phone]   Fax:  [phone]  Physical Therapy Treatment  Patient Details  Name: Melody Zhang MRN: [id number] Date of Birth: [birth year] Referring Provider: Dr. Fanny Skates   Encounter Date: 05/07/2017  PT End of Session - 05/07/17 1007    Visit Number  6    Number of Visits  10    Date for PT Re-Evaluation  05/19/17    PT Start Time  0810    PT Stop Time  0855    PT Time Calculation (min)  45 min    Activity Tolerance  Patient tolerated treatment well    Behavior During Therapy  [facility] for tasks assessed/performed       Past Medical History:  Diagnosis Date  . Cancer [facility])    breast cancer  . Diabetes mellitus   . Family history of adverse reaction to anesthesia    Brother's heart stopped during surgery on urethra at [facility]  . Family history of breast cancer   . Family history of pancreatic cancer   . GERD (gastroesophageal reflux disease)    not on medication for this, TUMS PRN  . Heart murmur   . Hyperlipemia   . Hypertension   . Intermittent palpitations    once a month  . Paroxysmal SVT (supraventricular tachycardia) (HCC)    Myoview was performed 03/26/11: Low risk with small partially reversible apical perfusion defect likely breast attenuation, no ischemia, no scar, EF 62%.   . Sleep apnea    CPAP    Past Surgical History:  Procedure Laterality Date  . ABDOMINAL HYSTERECTOMY    . BREAST LUMPECTOMY WITH RADIOACTIVE SEED AND SENTINEL LYMPH NODE BIOPSY Left 03/21/2017   Procedure: LEFT BREAST LUMPECTOMY WITH RADIOACTIVE SEED AND LEFT AXILLARY DEEP LYMPH NODE BIOPSY INJECTION BLUE DYE LEFT BREAST ERAS PATHWAY;  Surgeon: Fanny Skates, MD;  Location: [place];  Service: General;  Laterality: Left;  . HERNIA REPAIR     umbilical hernia    There were no vitals filed for this visit.  Subjective Assessment - 05/07/17 0813    Subjective  My  shoulder is doing good. It is getting better.     Pertinent History  Patient was diagnosed on 02/13/17 with left grade 2 invasive ductal carcinoma breast cancer. It was located in the upper inner quadrant. It is ER/PR positive and HER2 negative with a Ki67 of 25%. She had a left lumpectomy and sentinel node biopsy (0/3 nodes) on 03/21/17.    Patient Stated Goals  See if arm is ok    Currently in Pain?  No/denies    Pain Score  0-No pain                       [facility] Adult PT Treatment/Exercise - 05/07/17 0001      Shoulder Exercises: Pulleys   Flexion  2 minutes Purple ball at back for support    ABduction  2 minutes      Shoulder Exercises: Therapy Ball   Flexion  Both;10 reps pt returned demonstrated correctly    ABduction  Left;10 reps Same side lean into end of stretch, therapist demonstrated      Manual Therapy   Manual Therapy  Manual Lymphatic Drainage (MLD);Myofascial release;Passive ROM;Soft tissue mobilization    Soft tissue mobilization  to left posterior axilla    Myofascial Release  To Lt axilla during P/ROM    Manual Lymphatic Drainage (  MLD)  In Supine with HOB slightly elevated: Short neck, superficial and diaphragmatic breaths, Lt inguinal and Rt axillary nodes, Lt axillo-inguinal and anterior inter-axillary anastomosis then focused on superior to breast incision swelling and axilla    Passive ROM  to left shoulder in direction of flexion and abductio                  PT Long Term Goals - 04/21/17 1158      PT LONG TERM GOAL #1   Title  Patient will demonstrate she has returned to baseline related to shoulder ROM and function post operatively.    Time  4    Period  Weeks    Status  On-going      PT LONG TERM GOAL #2   Title  Patient will improve her DASH score to be </= 8 for improved overall shoulder function.    Baseline  15.91    Time  4    Period  Weeks    Status  New      PT LONG TERM GOAL #3   Title  Increase left shoulder  abduction active ROM to >/= 155 for increase ease with reaching and to return to pre-op baseline measurement.    Baseline  139 degrees post operatively    Time  4    Period  Weeks    Status  New      PT LONG TERM GOAL #4   Title  Patient will verbalize understanding of risk reduction practices.    Time  4    Period  [facility] Goals - 03/05/17 1544      Patient will be able to verbalize understanding of pertinent lymphedema risk reduction practices relevant to her diagnosis specifically related to skin care.   Time  1    Period  Days    Status  Achieved      Patient will be able to return demonstrate and/or verbalize understanding of the post-op home exercise program related to regaining shoulder range of motion.   Time  1    Period  Days    Status  Achieved      Patient will be able to verbalize understanding of the importance of attending the postoperative After Breast Cancer Class for further lymphedema risk reduction education and therapeutic exercise.   Time  1    Period  Days    Status  Achieved           Plan - 05/07/17 1008    Clinical Impression Statement  Pt demonstrates improving PROM today. She reports she has not been doing dowel exercises at home. Continued with MLD to left breast in area of swelling. Pt feels her sweling is improving. Continued with trigger point release to posterior scapula. Pt has increased pain and tenderness in this area. She is going today to get measured for a compression bra.     Rehab Potential  Excellent    Clinical Impairments Affecting Rehab Potential  None    PT Frequency  2x / week    PT Duration  4 weeks    PT Treatment/Interventions  ADLs/Self Care Home Management;Therapeutic exercise;Patient/family education;Therapeutic activities;Manual techniques;Passive range of motion;Scar mobilization;Manual lymph drainage    PT Next Visit Plan  Instruct pt in lymphedema risk reduction practices for goal  assess. Cont and review, having pt perform, manual lymph drainage focusing on left superior breast and axilla; PROM  to left shoulder to reduce axillary tightness and gentle soft tissue to posterior axilla where pt c/o tenderness.     PT Home Exercise Plan  Post op shoulder ROM HEP; supine dowel exercises    Consulted and Agree with Plan of Care  Patient       Patient will benefit from skilled therapeutic intervention in order to improve the following deficits and impairments:  Decreased knowledge of precautions, Decreased range of motion, Impaired UE functional use, Postural dysfunction, Pain, Increased edema, Decreased scar mobility  Visit Diagnosis: Abnormal posture  Stiffness of left shoulder, not elsewhere classified  Localized edema     Problem List Patient Active Problem List   Diagnosis Date Noted  . Genetic testing 03/18/2017  . Family history of breast cancer   . Family history of pancreatic cancer   . Malignant neoplasm of upper-inner quadrant of left breast in female, estrogen receptor positive ([place]) 03/04/2017  . SVT (supraventricular tachycardia) ([place]) 03/18/2011  . Hypokalemia 03/18/2011  . Diabetes mellitus 03/18/2011  . HYPERLIPIDEMIA 01/30/2007  . HYPERTENSION 01/30/2007  . ALLERGIC RHINITIS 01/30/2007    Allyson Sabal [facility] 05/07/2017, 10:10 AM  [place], [place], [address] Phone: [phone]   Fax:  [phone]  Name: Melody Zhang MRN: [id number] Date of Birth: [birth year]  Manus Gunning, PT 05/07/17 10:10 AM

## 2017-05-08 ENCOUNTER — Ambulatory Visit
Admission: RE | Admit: 2017-05-08 | Discharge: 2017-05-08 | Disposition: A | Discharge status: Home / Self Care | Payer: 59 | Source: Ambulatory Visit | Attending: Radiation Oncology | Admitting: Radiation Oncology

## 2017-05-08 DIAGNOSIS — Z170 Estrogen receptor positive status [ER+]: Secondary | ICD-10-CM | POA: Diagnosis not present

## 2017-05-08 DIAGNOSIS — C50212 Malignant neoplasm of upper-inner quadrant of left female breast: Secondary | ICD-10-CM | POA: Diagnosis not present

## 2017-05-08 DIAGNOSIS — Z510 Encounter for antineoplastic radiation therapy: Secondary | ICD-10-CM | POA: Diagnosis not present

## 2017-05-09 ENCOUNTER — Ambulatory Visit: Payer: 59

## 2017-05-12 ENCOUNTER — Encounter: Payer: Self-pay | Admitting: Physical Therapy

## 2017-05-12 ENCOUNTER — Ambulatory Visit: Payer: 59 | Admitting: Physical Therapy

## 2017-05-12 ENCOUNTER — Ambulatory Visit
Admission: RE | Admit: 2017-05-12 | Discharge: 2017-05-12 | Disposition: A | Discharge status: Home / Self Care | Payer: 59 | Source: Ambulatory Visit | Attending: Radiation Oncology | Admitting: Radiation Oncology

## 2017-05-12 DIAGNOSIS — R600 Localized edema: Secondary | ICD-10-CM | POA: Diagnosis not present

## 2017-05-12 DIAGNOSIS — R293 Abnormal posture: Secondary | ICD-10-CM

## 2017-05-12 DIAGNOSIS — M25612 Stiffness of left shoulder, not elsewhere classified: Secondary | ICD-10-CM

## 2017-05-12 DIAGNOSIS — Z170 Estrogen receptor positive status [ER+]: Secondary | ICD-10-CM | POA: Diagnosis not present

## 2017-05-12 DIAGNOSIS — Z510 Encounter for antineoplastic radiation therapy: Secondary | ICD-10-CM | POA: Diagnosis not present

## 2017-05-12 DIAGNOSIS — C50212 Malignant neoplasm of upper-inner quadrant of left female breast: Secondary | ICD-10-CM | POA: Diagnosis not present

## 2017-05-12 NOTE — Therapy (Addendum)
[place], [place], [address] Phone: [phone]   Fax:  [phone]  Physical Therapy Treatment  Patient Details  Name: Melody Zhang MRN: [id number] Date of Birth: 04-21-1955 Referring Provider: Dr. Fanny Skates   Encounter Date: 05/12/2017  PT End of Session - 05/12/17 1205    Visit Number  7    Number of Visits  10    Date for PT Re-Evaluation  05/19/17    PT Start Time  0852    PT Stop Time  0935    PT Time Calculation (min)  43 min    Activity Tolerance  Patient tolerated treatment well    Behavior During Therapy  [facility] for tasks assessed/performed       Past Medical History:  Diagnosis Date  . Cancer [facility])    breast cancer  . Diabetes mellitus   . Family history of adverse reaction to anesthesia    Brother's heart stopped during surgery on urethra at [facility]  . Family history of breast cancer   . Family history of pancreatic cancer   . GERD (gastroesophageal reflux disease)    not on medication for this, TUMS PRN  . Heart murmur   . Hyperlipemia   . Hypertension   . Intermittent palpitations    once a month  . Paroxysmal SVT (supraventricular tachycardia) (HCC)    Myoview was performed 03/26/11: Low risk with small partially reversible apical perfusion defect likely breast attenuation, no ischemia, no scar, EF 62%.   . Sleep apnea    CPAP    Past Surgical History:  Procedure Laterality Date  . ABDOMINAL HYSTERECTOMY    . BREAST LUMPECTOMY WITH RADIOACTIVE SEED AND SENTINEL LYMPH NODE BIOPSY Left 03/21/2017   Procedure: LEFT BREAST LUMPECTOMY WITH RADIOACTIVE SEED AND LEFT AXILLARY DEEP LYMPH NODE BIOPSY INJECTION BLUE DYE LEFT BREAST ERAS PATHWAY;  Surgeon: Fanny Skates, MD;  Location: [place];  Service: General;  Laterality: Left;  . HERNIA REPAIR     umbilical hernia    There were no vitals filed for this visit.  Subjective Assessment - 05/12/17 0853    Subjective  My  shoulder is good. I think I am going to postpone this until after radiation. I want you to look at my compression bra.     Pertinent History  Patient was diagnosed on 02/13/17 with left grade 2 invasive ductal carcinoma breast cancer. It was located in the upper inner quadrant. It is ER/PR positive and HER2 negative with a Ki67 of 25%. She had a left lumpectomy and sentinel node biopsy (0/3 nodes) on 03/21/17.    Patient Stated Goals  See if arm is ok    Currently in Pain?  No/denies    Pain Score  0-No pain         [facility] PT Assessment - 05/12/17 0001      AROM   Left Shoulder Flexion  170 Degrees    Left Shoulder ABduction  168 Degrees           Quick Dash - 05/12/17 0001    Open a tight or new jar  Moderate difficulty    Do heavy household chores (wash walls, wash floors)  Mild difficulty    Carry a shopping bag or briefcase  Moderate difficulty bc she is now carrying it on the right side    Wash your back  No difficulty    Use a knife to cut food  No difficulty  Recreational activities in which you take some force or impact through your arm, shoulder, or hand (golf, hammering, tennis)  Moderate difficulty    During the past week, to what extent has your arm, shoulder or hand problem interfered with your normal social activities with family, friends, neighbors, or groups?  Modererately    During the past week, to what extent has your arm, shoulder or hand problem limited your work or other regular daily activities  Modererately can't lift pts anymore     Arm, shoulder, or hand pain.  Mild    Tingling (pins and needles) in your arm, shoulder, or hand  None    Difficulty Sleeping  No difficulty    DASH Score  27.27 %             [facility] Adult PT Treatment/Exercise - 05/12/17 0001      Shoulder Exercises: Pulleys   Flexion  2 minutes Purple ball at back for support    ABduction  2 minutes      Manual Therapy   Manual Therapy  Manual Lymphatic Drainage (MLD)    Manual  Lymphatic Drainage (MLD)  In Supine with HOB slightly elevated: Short neck, superficial and diaphragmatic breaths, Lt inguinal and Rt axillary nodes, Lt axillo-inguinal and anterior inter-axillary anastomosis then focused on superior to breast incision swelling and axilla                  PT Long Term Goals - 05/12/17 0854      PT LONG TERM GOAL #1   Title  Patient will demonstrate she has returned to baseline related to shoulder ROM and function post operatively.    Time  4    Period  Weeks    Status  Achieved      PT LONG TERM GOAL #2   Title  Patient will improve her DASH score to be </= 8 for improved overall shoulder function.    Baseline  15.91, 4/1/519- 27.7 pt reports this is because she is more careful  of the left UE and is using her R UE more    Time  4    Period  Weeks    Status  On-going      PT LONG TERM GOAL #3   Title  Increase left shoulder abduction active ROM to >/= 155 for increase ease with reaching and to return to pre-op baseline measurement.    Baseline  139 degrees post operatively, 05/12/17- 168    Time  4    Period  Weeks    Status  Achieved      PT LONG TERM GOAL #4   Title  Patient will verbalize understanding of risk reduction practices.    Baseline  05/12/17- issued handout and reviewed with pt    Time  4    Period  Weeks    Status  Achieved      PT LONG TERM GOAL #5   Title  Pt will be independent in a home exercise program for strengthening of bilateral shoulders    Time  4    Period  Weeks    Status  New    Target Date  06/09/17      [facility] Goals - 03/05/17 1544      Patient will be able to verbalize understanding of pertinent lymphedema risk reduction practices relevant to her diagnosis specifically related to skin care.   Time  1    Period  Days  Status  Achieved      Patient will be able to return demonstrate and/or verbalize understanding of the post-op home exercise program related to regaining shoulder range of  motion.   Time  1    Period  Days    Status  Achieved      Patient will be able to verbalize understanding of the importance of attending the postoperative After Breast Cancer Class for further lymphedema risk reduction education and therapeutic exercise.   Time  1    Period  Days    Status  Achieved           Plan - 05/12/17 1206    Clinical Impression Statement  Assessed pt's progress towards goals. She has met her ROM goals for therapy. Her DASH score increased but pt reports that is because she is relying more on her right hand and using her LUE less to help decrease her risk of lymphedema. Pt has just started radiation and wants to be placed on hold until she completes radiation due to the number of appointments she has and she works 3 jobs. Encouraged pt to continue with ROM exercises throughout radiation to decrease tightness. She has received her compression bra and it fits appropriately and pt can tell a difference with her swelling.     Rehab Potential  Excellent    Clinical Impairments Affecting Rehab Potential  None    PT Frequency  2x / week    PT Duration  4 weeks    PT Treatment/Interventions  ADLs/Self Care Home Management;Therapeutic exercise;Patient/family education;Therapeutic activities;Manual techniques;Passive range of motion;Scar mobilization;Manual lymph drainage    PT Next Visit Plan  Pt may need recert, is being placed on hold while she completes radiation, instruct pt in a home exercise program for shoulder strengthening    PT Home Exercise Plan  Post op shoulder ROM HEP; supine dowel exercises    Consulted and Agree with Plan of Care  Patient       Patient will benefit from skilled therapeutic intervention in order to improve the following deficits and impairments:  Decreased knowledge of precautions, Decreased range of motion, Impaired UE functional use, Postural dysfunction, Pain, Increased edema, Decreased scar mobility  Visit Diagnosis: Abnormal  posture  Stiffness of left shoulder, not elsewhere classified  Localized edema     Problem List Patient Active Problem List   Diagnosis Date Noted  . Genetic testing 03/18/2017  . Family history of breast cancer   . Family history of pancreatic cancer   . Malignant neoplasm of upper-inner quadrant of left breast in female, estrogen receptor positive ([place]) 03/04/2017  . SVT (supraventricular tachycardia) ([place]) 03/18/2011  . Hypokalemia 03/18/2011  . Diabetes mellitus 03/18/2011  . HYPERLIPIDEMIA 01/30/2007  . HYPERTENSION 01/30/2007  . ALLERGIC RHINITIS 01/30/2007    Allyson Sabal [facility] 05/12/2017, 12:09 PM  [place], [place], [address] Phone: [phone]   Fax:  [phone]  Name: Melody Zhang MRN: [id number] Date of Birth: 1955-07-10  Manus Gunning, PT 05/12/17 12:09 PM  PHYSICAL THERAPY DISCHARGE SUMMARY  Visits from Start of Care: 7  Current functional level related to goals / functional outcomes: See above   Remaining deficits: See above   Education / Equipment: HEP  Plan: Patient agrees to discharge.  Patient goals were partially met. Patient is being discharged due to not returning since the last visit.  ?????     Allyson Sabal [place], [place] 11/06/17 10:53 AM

## 2017-05-13 ENCOUNTER — Ambulatory Visit
Admission: RE | Admit: 2017-05-13 | Discharge: 2017-05-13 | Disposition: A | Discharge status: Home / Self Care | Payer: 59 | Source: Ambulatory Visit | Attending: Radiation Oncology | Admitting: Radiation Oncology

## 2017-05-13 DIAGNOSIS — Z510 Encounter for antineoplastic radiation therapy: Secondary | ICD-10-CM | POA: Diagnosis not present

## 2017-05-13 DIAGNOSIS — Z170 Estrogen receptor positive status [ER+]: Secondary | ICD-10-CM | POA: Diagnosis not present

## 2017-05-13 DIAGNOSIS — C50212 Malignant neoplasm of upper-inner quadrant of left female breast: Secondary | ICD-10-CM | POA: Diagnosis not present

## 2017-05-14 ENCOUNTER — Ambulatory Visit
Admission: RE | Admit: 2017-05-14 | Discharge: 2017-05-14 | Disposition: A | Discharge status: Home / Self Care | Payer: 59 | Source: Ambulatory Visit | Attending: Radiation Oncology | Admitting: Radiation Oncology

## 2017-05-14 DIAGNOSIS — C50212 Malignant neoplasm of upper-inner quadrant of left female breast: Secondary | ICD-10-CM | POA: Diagnosis not present

## 2017-05-14 DIAGNOSIS — Z510 Encounter for antineoplastic radiation therapy: Secondary | ICD-10-CM | POA: Diagnosis not present

## 2017-05-14 DIAGNOSIS — Z170 Estrogen receptor positive status [ER+]: Secondary | ICD-10-CM | POA: Diagnosis not present

## 2017-05-15 ENCOUNTER — Ambulatory Visit
Admission: RE | Admit: 2017-05-15 | Discharge: 2017-05-15 | Disposition: A | Discharge status: Home / Self Care | Payer: 59 | Source: Ambulatory Visit | Attending: Radiation Oncology | Admitting: Radiation Oncology

## 2017-05-15 DIAGNOSIS — Z170 Estrogen receptor positive status [ER+]: Secondary | ICD-10-CM | POA: Diagnosis not present

## 2017-05-15 DIAGNOSIS — Z510 Encounter for antineoplastic radiation therapy: Secondary | ICD-10-CM | POA: Diagnosis not present

## 2017-05-15 DIAGNOSIS — C50212 Malignant neoplasm of upper-inner quadrant of left female breast: Secondary | ICD-10-CM | POA: Diagnosis not present

## 2017-05-16 ENCOUNTER — Ambulatory Visit
Admission: RE | Admit: 2017-05-16 | Discharge: 2017-05-16 | Disposition: A | Discharge status: Home / Self Care | Payer: 59 | Source: Ambulatory Visit | Attending: Radiation Oncology | Admitting: Radiation Oncology

## 2017-05-16 DIAGNOSIS — Z510 Encounter for antineoplastic radiation therapy: Secondary | ICD-10-CM | POA: Diagnosis not present

## 2017-05-16 DIAGNOSIS — C50212 Malignant neoplasm of upper-inner quadrant of left female breast: Secondary | ICD-10-CM | POA: Diagnosis not present

## 2017-05-16 DIAGNOSIS — Z170 Estrogen receptor positive status [ER+]: Secondary | ICD-10-CM

## 2017-05-16 MED ORDER — ALRA NON-METALLIC DEODORANT (RAD-ONC)
1.0000 "application " | Freq: Once | TOPICAL | Status: AC
  Administered 2017-05-16: 1 via TOPICAL

## 2017-05-16 MED ORDER — RADIAPLEXRX EX GEL
Freq: Once | CUTANEOUS | Status: AC
  Administered 2017-05-16: 13:00:00 via TOPICAL

## 2017-05-19 ENCOUNTER — Ambulatory Visit
Admission: RE | Admit: 2017-05-19 | Discharge: 2017-05-19 | Disposition: A | Discharge status: Home / Self Care | Payer: 59 | Source: Ambulatory Visit | Attending: Radiation Oncology | Admitting: Radiation Oncology

## 2017-05-19 DIAGNOSIS — C50212 Malignant neoplasm of upper-inner quadrant of left female breast: Secondary | ICD-10-CM | POA: Diagnosis not present

## 2017-05-19 DIAGNOSIS — Z170 Estrogen receptor positive status [ER+]: Secondary | ICD-10-CM | POA: Diagnosis not present

## 2017-05-19 DIAGNOSIS — C50912 Malignant neoplasm of unspecified site of left female breast: Secondary | ICD-10-CM | POA: Diagnosis not present

## 2017-05-19 DIAGNOSIS — Z510 Encounter for antineoplastic radiation therapy: Secondary | ICD-10-CM | POA: Diagnosis not present

## 2017-05-20 ENCOUNTER — Ambulatory Visit
Admission: RE | Admit: 2017-05-20 | Discharge: 2017-05-20 | Disposition: A | Discharge status: Home / Self Care | Payer: 59 | Source: Ambulatory Visit | Attending: Radiation Oncology | Admitting: Radiation Oncology

## 2017-05-20 DIAGNOSIS — Z170 Estrogen receptor positive status [ER+]: Secondary | ICD-10-CM | POA: Diagnosis not present

## 2017-05-20 DIAGNOSIS — Z510 Encounter for antineoplastic radiation therapy: Secondary | ICD-10-CM | POA: Diagnosis not present

## 2017-05-20 DIAGNOSIS — C50212 Malignant neoplasm of upper-inner quadrant of left female breast: Secondary | ICD-10-CM | POA: Diagnosis not present

## 2017-05-21 ENCOUNTER — Ambulatory Visit
Admission: RE | Admit: 2017-05-21 | Discharge: 2017-05-21 | Disposition: A | Discharge status: Home / Self Care | Payer: 59 | Source: Ambulatory Visit | Attending: Radiation Oncology | Admitting: Radiation Oncology

## 2017-05-21 DIAGNOSIS — Z170 Estrogen receptor positive status [ER+]: Secondary | ICD-10-CM | POA: Diagnosis not present

## 2017-05-21 DIAGNOSIS — C50212 Malignant neoplasm of upper-inner quadrant of left female breast: Secondary | ICD-10-CM | POA: Diagnosis not present

## 2017-05-21 DIAGNOSIS — Z510 Encounter for antineoplastic radiation therapy: Secondary | ICD-10-CM | POA: Diagnosis not present

## 2017-05-22 ENCOUNTER — Ambulatory Visit
Admission: RE | Admit: 2017-05-22 | Discharge: 2017-05-22 | Disposition: A | Discharge status: Home / Self Care | Payer: 59 | Source: Ambulatory Visit | Attending: Radiation Oncology | Admitting: Radiation Oncology

## 2017-05-22 DIAGNOSIS — Z510 Encounter for antineoplastic radiation therapy: Secondary | ICD-10-CM | POA: Diagnosis not present

## 2017-05-22 DIAGNOSIS — Z170 Estrogen receptor positive status [ER+]: Secondary | ICD-10-CM | POA: Diagnosis not present

## 2017-05-22 DIAGNOSIS — C50212 Malignant neoplasm of upper-inner quadrant of left female breast: Secondary | ICD-10-CM | POA: Diagnosis not present

## 2017-05-23 ENCOUNTER — Ambulatory Visit
Admission: RE | Admit: 2017-05-23 | Discharge: 2017-05-23 | Disposition: A | Discharge status: Home / Self Care | Payer: 59 | Source: Ambulatory Visit | Attending: Radiation Oncology | Admitting: Radiation Oncology

## 2017-05-23 DIAGNOSIS — Z510 Encounter for antineoplastic radiation therapy: Secondary | ICD-10-CM | POA: Diagnosis not present

## 2017-05-23 DIAGNOSIS — Z170 Estrogen receptor positive status [ER+]: Secondary | ICD-10-CM | POA: Diagnosis not present

## 2017-05-23 DIAGNOSIS — C50212 Malignant neoplasm of upper-inner quadrant of left female breast: Secondary | ICD-10-CM | POA: Diagnosis not present

## 2017-05-26 ENCOUNTER — Ambulatory Visit
Admission: RE | Admit: 2017-05-26 | Discharge: 2017-05-26 | Disposition: A | Discharge status: Home / Self Care | Payer: 59 | Source: Ambulatory Visit | Attending: Radiation Oncology | Admitting: Radiation Oncology

## 2017-05-26 DIAGNOSIS — Z510 Encounter for antineoplastic radiation therapy: Secondary | ICD-10-CM | POA: Diagnosis not present

## 2017-05-26 DIAGNOSIS — Z170 Estrogen receptor positive status [ER+]: Secondary | ICD-10-CM | POA: Diagnosis not present

## 2017-05-26 DIAGNOSIS — C50212 Malignant neoplasm of upper-inner quadrant of left female breast: Secondary | ICD-10-CM | POA: Diagnosis not present

## 2017-05-27 ENCOUNTER — Ambulatory Visit
Admission: RE | Admit: 2017-05-27 | Discharge: 2017-05-27 | Disposition: A | Discharge status: Home / Self Care | Payer: 59 | Source: Ambulatory Visit | Attending: Radiation Oncology | Admitting: Radiation Oncology

## 2017-05-27 DIAGNOSIS — Z510 Encounter for antineoplastic radiation therapy: Secondary | ICD-10-CM | POA: Diagnosis not present

## 2017-05-27 DIAGNOSIS — Z170 Estrogen receptor positive status [ER+]: Secondary | ICD-10-CM | POA: Diagnosis not present

## 2017-05-27 DIAGNOSIS — C50212 Malignant neoplasm of upper-inner quadrant of left female breast: Secondary | ICD-10-CM | POA: Diagnosis not present

## 2017-05-28 ENCOUNTER — Ambulatory Visit
Admission: RE | Admit: 2017-05-28 | Discharge: 2017-05-28 | Disposition: A | Discharge status: Home / Self Care | Payer: 59 | Source: Ambulatory Visit | Attending: Radiation Oncology | Admitting: Radiation Oncology

## 2017-05-28 DIAGNOSIS — Z510 Encounter for antineoplastic radiation therapy: Secondary | ICD-10-CM | POA: Insufficient documentation

## 2017-05-28 DIAGNOSIS — C50212 Malignant neoplasm of upper-inner quadrant of left female breast: Secondary | ICD-10-CM | POA: Diagnosis not present

## 2017-05-28 DIAGNOSIS — Z170 Estrogen receptor positive status [ER+]: Secondary | ICD-10-CM | POA: Diagnosis not present

## 2017-05-29 ENCOUNTER — Ambulatory Visit
Admission: RE | Admit: 2017-05-29 | Discharge: 2017-05-29 | Disposition: A | Discharge status: Home / Self Care | Payer: 59 | Source: Ambulatory Visit | Attending: Radiation Oncology | Admitting: Radiation Oncology

## 2017-05-29 DIAGNOSIS — Z510 Encounter for antineoplastic radiation therapy: Secondary | ICD-10-CM | POA: Diagnosis not present

## 2017-05-29 DIAGNOSIS — C50212 Malignant neoplasm of upper-inner quadrant of left female breast: Secondary | ICD-10-CM | POA: Diagnosis not present

## 2017-05-29 DIAGNOSIS — Z170 Estrogen receptor positive status [ER+]: Secondary | ICD-10-CM | POA: Diagnosis not present

## 2017-05-29 MED FILL — POTASSIUM CL ER 20 MEQ TABL: 20 | 90 days supply | Qty: 90 | Fill #1

## 2017-05-29 MED FILL — LOSARTAN-HCTZ 100-25 MG TAB: 100-25 | 90 days supply | Qty: 90 | Fill #1

## 2017-05-30 ENCOUNTER — Ambulatory Visit: Payer: 59 | Admitting: Radiation Oncology

## 2017-05-30 ENCOUNTER — Ambulatory Visit
Admission: RE | Admit: 2017-05-30 | Discharge: 2017-05-30 | Disposition: A | Discharge status: Home / Self Care | Payer: 59 | Source: Ambulatory Visit | Attending: Radiation Oncology | Admitting: Radiation Oncology

## 2017-05-30 DIAGNOSIS — Z510 Encounter for antineoplastic radiation therapy: Secondary | ICD-10-CM | POA: Diagnosis not present

## 2017-05-30 DIAGNOSIS — C50212 Malignant neoplasm of upper-inner quadrant of left female breast: Secondary | ICD-10-CM | POA: Diagnosis not present

## 2017-05-30 DIAGNOSIS — Z170 Estrogen receptor positive status [ER+]: Secondary | ICD-10-CM | POA: Diagnosis not present

## 2017-06-02 ENCOUNTER — Ambulatory Visit
Admission: RE | Admit: 2017-06-02 | Discharge: 2017-06-02 | Disposition: A | Discharge status: Home / Self Care | Payer: 59 | Source: Ambulatory Visit | Attending: Radiation Oncology | Admitting: Radiation Oncology

## 2017-06-02 DIAGNOSIS — Z510 Encounter for antineoplastic radiation therapy: Secondary | ICD-10-CM | POA: Diagnosis not present

## 2017-06-02 DIAGNOSIS — Z170 Estrogen receptor positive status [ER+]: Secondary | ICD-10-CM | POA: Diagnosis not present

## 2017-06-02 DIAGNOSIS — C50212 Malignant neoplasm of upper-inner quadrant of left female breast: Secondary | ICD-10-CM | POA: Diagnosis not present

## 2017-06-03 ENCOUNTER — Ambulatory Visit
Admission: RE | Admit: 2017-06-03 | Discharge: 2017-06-03 | Disposition: A | Discharge status: Home / Self Care | Payer: 59 | Source: Ambulatory Visit | Attending: Radiation Oncology | Admitting: Radiation Oncology

## 2017-06-03 ENCOUNTER — Telehealth: Payer: Self-pay | Admitting: Hematology and Oncology

## 2017-06-03 ENCOUNTER — Ambulatory Visit: Payer: 59 | Admitting: Radiation Oncology

## 2017-06-03 ENCOUNTER — Inpatient Hospital Stay: Payer: 59 | Attending: Hematology and Oncology | Admitting: Hematology and Oncology

## 2017-06-03 DIAGNOSIS — C50212 Malignant neoplasm of upper-inner quadrant of left female breast: Secondary | ICD-10-CM | POA: Diagnosis present

## 2017-06-03 DIAGNOSIS — Z170 Estrogen receptor positive status [ER+]: Secondary | ICD-10-CM | POA: Insufficient documentation

## 2017-06-03 DIAGNOSIS — Z510 Encounter for antineoplastic radiation therapy: Secondary | ICD-10-CM | POA: Diagnosis not present

## 2017-06-03 MED ORDER — LETROZOLE 2.5 MG PO TABS: 2.5000 mg | ORAL_TABLET | Freq: Every day | ORAL | 3 refills | Status: DC

## 2017-06-03 MED FILL — LETROZOLE 2.5 MG TABLET: 2.5 | 90 days supply | Qty: 90 | Fill #0

## 2017-06-03 NOTE — Progress Notes (Signed)
Patient Care Team: Maury Dus, MD as PCP - General (Family Medicine)  DIAGNOSIS:  Encounter Diagnosis  Name Primary?  . Malignant neoplasm of upper-inner quadrant of left breast in female, estrogen receptor positive ([place])     SUMMARY OF ONCOLOGIC HISTORY:   Malignant neoplasm of upper-inner quadrant of left breast in female, estrogen receptor positive ([place])   02/24/2017 Initial Diagnosis    Screening detected left breast mass UIQ 1130 position: 1.9 cm, ultrasound biopsy: IDC grade 2, DCIS, ER 100%, PR 20%, Ki-67 25%, HER-2 negative ratio 1.43, T1 cN0 stage I a AJCC 8      03/17/2017 Genetic Testing    Negative genetic testing on the 9-gene STAT panel.  The STAT Breast cancer panel offered by [facility] includes sequencing and rearrangement analysis for the following 9 genes:  ATM, BRCA1, BRCA2, CDH1, CHEK2, PALB2, PTEN, STK11 and TP53.   The report date is March 17, 2017.  Testing was reflexed to larger common hereditary cancer panel.    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  The Hereditary Gene Panel offered by [facility] includes sequencing and/or deletion duplication testing of the following 47 genes: APC, ATM, AXIN2, BARD1, BMPR1A, BRCA1, BRCA2, BRIP1, CDH1, CDK4, CDKN2A (p14ARF), CDKN2A (p16INK4a), CHEK2, CTNNA1, DICER1, EPCAM (Deletion/duplication testing only), GREM1 (promoter region deletion/duplication testing only), KIT, MEN1, MLH1, MSH2, MSH3, MSH6, MUTYH, NBN, NF1, NHTL1, PALB2, PDGFRA, PMS2, POLD1, POLE, PTEN, RAD50, RAD51C, RAD51D, SDHB, SDHC, SDHD, SMAD4, SMARCA4. STK11, TP53, TSC1, TSC2, and VHL.  The following genes were evaluated for sequence changes only: SDHA and HOXB13 c.251G>A variant only. The report date is March 19, 2017.       03/21/2017 Surgery    Left lumpectomy: IDC grade 2, 1.5 cm, DCIS intermediate grade, margins negative, intraductal papilloma and fibrocystic changes, 0/3 lymph nodes negative, ER 100%, PR 20%, HER-2 negative,  Ki-67 25%, T1CN0 stage I a      04/10/2017 Oncotype testing    Oncotype DX recurrence score 20: 6% risk of recurrence with hormone therapy      05/12/2017 -  Radiation Therapy    Adjuvant radiation therapy       CHIEF COMPLIANT: Ongoing radiation therapy  INTERVAL HISTORY: Melody Zhang is a 62 year old with above-mentioned history left breast cancer treated with lumpectomy and adjuvant radiation therapy.  She is nearly done with the radiation.  She is here to talk about the next step in the treatment plan.  This would be with antiestrogen therapy.  REVIEW OF SYSTEMS:   Constitutional: Denies fevers, chills or abnormal weight loss Eyes: Denies blurriness of vision Ears, nose, mouth, throat, and face: Denies mucositis or sore throat Respiratory: Denies cough, dyspnea or wheezes Cardiovascular: Denies palpitation, chest discomfort Gastrointestinal:  Denies nausea, heartburn or change in bowel habits Skin: Denies abnormal skin rashes Lymphatics: Denies new lymphadenopathy or easy bruising Neurological:Denies numbness, tingling or new weaknesses Behavioral/Psych: Mood is stable, no new changes  Extremities: No lower extremity edema Breast: Radiation dermatitis.  Denies any pain or lumps or nodules in either breasts All other systems were reviewed with the patient and are negative.  I have reviewed the past medical history, past surgical history, social history and family history with the patient and they are unchanged from previous note.  ALLERGIES:  is allergic to dobutamine and lisinopril.  MEDICATIONS:  Current Outpatient Medications  Medication Sig Dispense Refill  . aspirin 81 MG EC tablet Take 81 mg by mouth every evening.    . calcium carbonate (  TUMS - DOSED IN MG ELEMENTAL CALCIUM) 500 MG chewable tablet Chew 2 tablets by mouth 3 (three) times daily as needed for indigestion or heartburn.    . diltiazem (CARDIZEM) 120 MG tablet Take 120 mg by mouth every evening.  0  .  ferrous sulfate 325 (65 FE) MG tablet Take 325 mg by mouth every evening.    Marland Kitchen HYDROcodone-acetaminophen (NORCO) 5-325 MG tablet Take 1-2 tablets by mouth every 6 (six) hours as needed for moderate pain or severe pain. (Patient not taking: Reported on 04/29/2017) 20 tablet 0  . ibuprofen (ADVIL,MOTRIN) 200 MG tablet Take 800 mg by mouth every 8 (eight) hours as needed (for pain.).    Marland Kitchen KLOR-CON M20 20 MEQ tablet TAKE ONE TABLET BY MOUTH EVERY DAY (Patient taking differently: TAKE ONE TABLET BY MOUTH EVERY DAY IN THE EVENING.) 15 tablet 0  . letrozole (FEMARA) 2.5 MG tablet Take 1 tablet (2.5 mg total) by mouth daily. 90 tablet 3  . losartan-hydrochlorothiazide (HYZAAR) 100-25 MG tablet Take 1 tablet by mouth every evening.  1  . metFORMIN (GLUCOPHAGE-XR) 750 MG 24 hr tablet Take 750 mg by mouth every evening.  0  . nitroGLYCERIN (NITROSTAT) 0.4 MG SL tablet Place 1 tablet (0.4 mg total) under the tongue every 5 (five) minutes x 3 doses as needed for chest pain. (Patient not taking: Reported on 04/29/2017) 25 tablet 3  . Probiotic Product (ACIDOPHILUS/BIFIDUS PO) Take 1 capsule by mouth every evening.     . rosuvastatin (CRESTOR) 20 MG tablet Take 20 mg by mouth every evening.    . vitamin B-12 (CYANOCOBALAMIN) 500 MCG tablet Take 1,000 mcg by mouth every evening.     . vitamin C (ASCORBIC ACID) 500 MG tablet Take 500 mg by mouth every evening.      No current facility-administered medications for this visit.     PHYSICAL EXAMINATION: ECOG PERFORMANCE STATUS: 1 - Symptomatic but completely ambulatory  Vitals:   06/03/17 1405  BP: (!) 143/80  Pulse: 65  Resp: 17  Temp: 98.4 F (36.9 C)  SpO2: 99%   Filed Weights   06/03/17 1405  Weight: 278 lb 4.8 oz (126.2 kg)    GENERAL:alert, no distress and comfortable SKIN: skin color, texture, turgor are normal, no rashes or significant lesions EYES: normal, Conjunctiva are pink and non-injected, sclera clear OROPHARYNX:no exudate, no erythema and  lips, buccal mucosa, and tongue normal  NECK: supple, thyroid normal size, non-tender, without nodularity LYMPH:  no palpable lymphadenopathy in the cervical, axillary or inguinal LUNGS: clear to auscultation and percussion with normal breathing effort HEART: regular rate & rhythm and no murmurs and no lower extremity edema ABDOMEN:abdomen soft, non-tender and normal bowel sounds MUSCULOSKELETAL:no cyanosis of digits and no clubbing  NEURO: alert & oriented x 3 with fluent speech, no focal motor/sensory deficits EXTREMITIES: No lower extremity edema BREAST: No palpable masses or nodules in either right or left breasts. No palpable axillary supraclavicular or infraclavicular adenopathy no breast tenderness or nipple discharge. (exam performed in the presence of a chaperone)  LABORATORY DATA:  I have reviewed the data as listed CMP Latest Ref Rng & Units 03/18/2017 03/05/2017 04/12/2011  Glucose 65 - 99 mg/dL 99 102 97  BUN 6 - 20 mg/dL _0 Creatinine 0.44 - 1.00 mg/dL 0.93 0.92 0.8  Sodium 135 - 145 mmol/L 140 140 139  Potassium 3.5 - 5.1 mmol/L 3.5 3.5 3.7  Chloride 101 - 111 mmol/L 102 102 102  CO2 22 -  32 mmol/L _0 Calcium 8.9 - 10.3 mg/dL 9.7 9.9 9.6  Total Protein 6.4 - 8.3 g/dL - 7.4 -  Total Bilirubin 0.2 - 1.2 mg/dL - 0.3 -  Alkaline Phos 40 - 150 U/L - 73 -  AST 5 - 34 U/L - 14 -  ALT 0 - 55 U/L - 13 -    Lab Results  Component Value Date   WBC 7.4 03/18/2017   HGB 12.0 03/18/2017   HCT 38.1 03/18/2017   MCV 81.8 03/18/2017   PLT 280 03/18/2017   NEUTROABS 7.0 (H) 03/05/2017    ASSESSMENT & PLAN:  Malignant neoplasm of upper-inner quadrant of left breast in female, estrogen receptor positive ([place]) 03/21/2017: Left lumpectomy: IDC grade 2, 1.5 cm, DCIS intermediate grade, margins negative, intraductal papilloma and fibrocystic changes, 0/3 lymph nodes negative, ER 100%, PR 20%, HER-2 negative, Ki-67 25%, T1CN0 stage I a Oncotype DX recurrence score 20: 6% risk  of recurrence with hormone therapy  Adjuvant radiation therapy started 05/12/2017 Treatment plan: Once radiation is complete plan to start antiestrogen therapy with letrozole 2.5 mg daily x5 to 7 years  Letrozole counseling:We discussed the risks and benefits of anti-estrogen therapy with aromatase inhibitors. These include but not limited to insomnia, hot flashes, mood changes, vaginal dryness, bone density loss, and weight gain. We strongly believe that the benefits far outweigh the risks. Patient understands these risks and consented to starting treatment. Planned treatment duration is 5-7 years.  Patient is a Software engineer at [employer]. Return to clinic in 4 months for survivorship care plan visit  No orders of the defined types were placed in this encounter.  The patient has a good understanding of the overall plan. she agrees with it. she will call with any problems that may develop before the next visit here.   Harriette Ohara, MD 06/03/17

## 2017-06-03 NOTE — Assessment & Plan Note (Signed)
03/21/2017: Left lumpectomy: IDC grade 2, 1.5 cm, DCIS intermediate grade, margins negative, intraductal papilloma and fibrocystic changes, 0/3 lymph nodes negative, ER 100%, PR 20%, HER-2 negative, Ki-67 25%, T1CN0 stage I a Oncotype DX recurrence score 20: 6% risk of recurrence with hormone therapy  Adjuvant radiation therapy started 05/12/2017 Treatment plan: Once radiation is complete plan to start antiestrogen therapy with letrozole 2.5 mg daily x5 to 7 years  Letrozole counseling:We discussed the risks and benefits of anti-estrogen therapy with aromatase inhibitors. These include but not limited to insomnia, hot flashes, mood changes, vaginal dryness, bone density loss, and weight gain. We strongly believe that the benefits far outweigh the risks. Patient understands these risks and consented to starting treatment. Planned treatment duration is 5-7 years.  Return to clinic in 4 months for survivorship care plan visit  

## 2017-06-03 NOTE — Telephone Encounter (Signed)
Gave patient AVs and calendar of upcoming august appointments.  °

## 2017-06-04 ENCOUNTER — Ambulatory Visit
Admission: RE | Admit: 2017-06-04 | Discharge: 2017-06-04 | Disposition: A | Discharge status: Home / Self Care | Payer: 59 | Source: Ambulatory Visit | Attending: Radiation Oncology | Admitting: Radiation Oncology

## 2017-06-04 DIAGNOSIS — Z510 Encounter for antineoplastic radiation therapy: Secondary | ICD-10-CM | POA: Diagnosis not present

## 2017-06-04 DIAGNOSIS — Z170 Estrogen receptor positive status [ER+]: Secondary | ICD-10-CM | POA: Diagnosis not present

## 2017-06-04 DIAGNOSIS — C50212 Malignant neoplasm of upper-inner quadrant of left female breast: Secondary | ICD-10-CM | POA: Diagnosis not present

## 2017-06-05 ENCOUNTER — Ambulatory Visit
Admission: RE | Admit: 2017-06-05 | Discharge: 2017-06-05 | Disposition: A | Discharge status: Home / Self Care | Payer: 59 | Source: Ambulatory Visit | Attending: Radiation Oncology | Admitting: Radiation Oncology

## 2017-06-05 DIAGNOSIS — Z510 Encounter for antineoplastic radiation therapy: Secondary | ICD-10-CM | POA: Diagnosis not present

## 2017-06-05 DIAGNOSIS — C50212 Malignant neoplasm of upper-inner quadrant of left female breast: Secondary | ICD-10-CM | POA: Diagnosis not present

## 2017-06-05 DIAGNOSIS — G4733 Obstructive sleep apnea (adult) (pediatric): Secondary | ICD-10-CM | POA: Diagnosis not present

## 2017-06-05 DIAGNOSIS — Z170 Estrogen receptor positive status [ER+]: Secondary | ICD-10-CM | POA: Diagnosis not present

## 2017-06-05 MED FILL — GAVILYTE-G SOLUTION: 236 | 1 days supply | Qty: 4000 | Fill #0

## 2017-06-06 ENCOUNTER — Ambulatory Visit
Admission: RE | Admit: 2017-06-06 | Discharge: 2017-06-06 | Disposition: A | Discharge status: Home / Self Care | Payer: 59 | Source: Ambulatory Visit | Attending: Radiation Oncology | Admitting: Radiation Oncology

## 2017-06-06 DIAGNOSIS — Z170 Estrogen receptor positive status [ER+]: Secondary | ICD-10-CM | POA: Diagnosis not present

## 2017-06-06 DIAGNOSIS — C50212 Malignant neoplasm of upper-inner quadrant of left female breast: Secondary | ICD-10-CM | POA: Diagnosis not present

## 2017-06-06 DIAGNOSIS — Z510 Encounter for antineoplastic radiation therapy: Secondary | ICD-10-CM | POA: Diagnosis not present

## 2017-06-11 DIAGNOSIS — Z1211 Encounter for screening for malignant neoplasm of colon: Secondary | ICD-10-CM | POA: Diagnosis not present

## 2017-06-11 DIAGNOSIS — K5730 Diverticulosis of large intestine without perforation or abscess without bleeding: Secondary | ICD-10-CM | POA: Diagnosis not present

## 2017-06-30 DIAGNOSIS — G4733 Obstructive sleep apnea (adult) (pediatric): Secondary | ICD-10-CM | POA: Diagnosis not present

## 2017-07-01 ENCOUNTER — Encounter: Payer: Self-pay | Admitting: Radiation Oncology

## 2017-07-01 NOTE — Progress Notes (Signed)
  Radiation Oncology         [phone]) [phone] ________________________________  Name: Melody Zhang MRN: [id number]  Date: 07/01/2017  DOB: [birth year]  End of Treatment Note  Diagnosis:   IDC grade 2, 1.5 cm, DCIS intermediate grade, margins negative, intraductal papilloma and fibrocystic changes, 0/3 lymph nodes negative, ER 100%, PR 20%, HER-2 negative, Ki-67 25%, T1CN0 stage I a     Indication for treatment:  Curative       Radiation treatment dates:   05/12/17 - 06/06/17  Site/dose:   Left breast treated to 42.5 Gy with 17 fx of 2.5 Gy followed by a boost of 7.5 Gy with 3 fx of 2.5 Gy  Beams/energy:   3D / 10X  Narrative: The patient tolerated radiation treatment relatively well.     Plan: The patient has completed radiation treatment. The patient will return to radiation oncology clinic for routine followup in one month. I advised them to call or return sooner if they have any questions or concerns related to their recovery or treatment.  ------------------------------------------------  Jodelle Gross, MD, PhD  This document serves as a record of services personally performed by Kyung Rudd, MD. It was created on his behalf by Linward Natal, a trained medical scribe. The creation of this record is based on the scribe's personal observations and the provider's statements to them. This document has been checked and approved by the attending provider.

## 2017-07-06 DIAGNOSIS — G4733 Obstructive sleep apnea (adult) (pediatric): Secondary | ICD-10-CM | POA: Diagnosis not present

## 2017-07-14 ENCOUNTER — Other Ambulatory Visit: Payer: Self-pay

## 2017-07-14 ENCOUNTER — Ambulatory Visit
Admission: RE | Admit: 2017-07-14 | Discharge: 2017-07-14 | Disposition: A | Discharge status: Home / Self Care | Payer: 59 | Source: Ambulatory Visit | Attending: Radiation Oncology | Admitting: Radiation Oncology

## 2017-07-14 ENCOUNTER — Encounter: Payer: Self-pay | Admitting: Radiation Oncology

## 2017-07-14 VITALS — BP 121/68 | HR 79 | Temp 98.4°F | Resp 18 | Ht 67.0 in | Wt 276.2 lb

## 2017-07-14 DIAGNOSIS — Z888 Allergy status to other drugs, medicaments and biological substances status: Secondary | ICD-10-CM | POA: Diagnosis not present

## 2017-07-14 DIAGNOSIS — C50212 Malignant neoplasm of upper-inner quadrant of left female breast: Secondary | ICD-10-CM

## 2017-07-14 DIAGNOSIS — Z08 Encounter for follow-up examination after completed treatment for malignant neoplasm: Secondary | ICD-10-CM | POA: Insufficient documentation

## 2017-07-14 DIAGNOSIS — Z170 Estrogen receptor positive status [ER+]: Secondary | ICD-10-CM

## 2017-07-14 DIAGNOSIS — Z923 Personal history of irradiation: Secondary | ICD-10-CM | POA: Diagnosis not present

## 2017-07-14 DIAGNOSIS — C50912 Malignant neoplasm of unspecified site of left female breast: Secondary | ICD-10-CM | POA: Diagnosis present

## 2017-07-14 DIAGNOSIS — Z7982 Long term (current) use of aspirin: Secondary | ICD-10-CM | POA: Diagnosis not present

## 2017-07-14 DIAGNOSIS — D0512 Intraductal carcinoma in situ of left breast: Secondary | ICD-10-CM | POA: Diagnosis present

## 2017-07-14 DIAGNOSIS — Z853 Personal history of malignant neoplasm of breast: Secondary | ICD-10-CM | POA: Diagnosis not present

## 2017-07-14 NOTE — Progress Notes (Signed)
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 During treatment she did very well with radiotherapy and did not have significant desquamation.                             On review of systems, the patient states she's doing well overall. No concerns are noted with her skin at this time.  ALLERGIES:  is allergic to dobutamine and lisinopril.  Meds: Current Outpatient Medications  Medication Sig Dispense Refill  . aspirin 81 MG EC tablet Take 81 mg by mouth every evening.    . diltiazem (CARDIZEM) 120 MG tablet Take 120 mg by mouth every evening.  0  . ibuprofen (ADVIL,MOTRIN) 200 MG tablet Take 800 mg by mouth every 8 (eight) hours as needed (for pain.).    Marland Kitchen KLOR-CON M20 20 MEQ tablet TAKE ONE TABLET BY MOUTH EVERY DAY (Patient taking differently: TAKE ONE TABLET BY MOUTH EVERY DAY IN THE EVENING.) 15 tablet 0  . letrozole (FEMARA) 2.5 MG tablet Take 1 tablet (2.5 mg total) by mouth daily. 90 tablet 3  . losartan-hydrochlorothiazide (HYZAAR) 100-25 MG tablet Take 1 tablet by mouth every evening.  1  . metFORMIN (GLUCOPHAGE-XR) 750 MG 24 hr tablet Take 750 mg by mouth every evening.  0  . Probiotic Product (ACIDOPHILUS/BIFIDUS PO) Take 1 capsule by mouth every evening.     . rosuvastatin (CRESTOR) 20 MG tablet Take 20 mg by mouth every evening.    . vitamin B-12 (CYANOCOBALAMIN) 500 MCG tablet Take 1,000 mcg by mouth every evening.     . vitamin C (ASCORBIC ACID) 500 MG  tablet Take 500 mg by mouth every evening.     . calcium carbonate (TUMS - DOSED IN MG ELEMENTAL CALCIUM) 500 MG chewable tablet Chew 2 tablets by mouth 3 (three) times daily as needed for indigestion or heartburn.    . ferrous sulfate 325 (65 FE) MG tablet Take 325 mg by mouth every evening.    . nitroGLYCERIN (NITROSTAT) 0.4 MG SL tablet Place 1 tablet (0.4 mg total) under the tongue every 5 (five) minutes x 3 doses as needed for chest pain. (Patient not taking: Reported on 04/29/2017) 25 tablet 3   No current facility-administered medications for this encounter.     Physical Findings:  height is 5\' 7"  (1.702 m) and weight is 276 lb 3.2 oz (125.3 kg). Her oral temperature is 98.4 F (36.9 C). Her blood pressure is 121/68 and her pulse is 79. Her respiration is 18 and oxygen saturation is 95%.  Pain Assessment Pain Score: 0-No pain/10 In general this is a well appearing African American Female in no acute distress. She's alert and oriented x4 and appropriate throughout the examination. Cardiopulmonary assessment is negative for acute distress and she exhibits normal effort. The left breast was examined and reveals mild hyperpigmentation of the lateral aspect  of the breast. No desquamation is noted.   Lab Findings: Lab Results  Component Value Date   WBC 7.4 03/18/2017   HGB 12.0 03/18/2017   HCT 38.1 03/18/2017   MCV 81.8 03/18/2017   PLT 280 03/18/2017     Radiographic Findings: No results found.  Impression/Plan: 1. 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 The patient has been doing well since completion of radiotherapy. We discussed that we would be happy to continue to follow her as needed, but she will also continue to follow up with Dr. Lindi Adie in medical oncology. She was counseled on skin care as well as measures to avoid sun exposure to this area.  2. Survivorship. We discussed the importance of survivorship evaluation  and she is  currently scheduled for this in the near future. She was also given the monthly calendar for access to resources offered within the cancer center.     Carola Rhine, PAC

## 2017-07-16 DIAGNOSIS — Z6841 Body Mass Index (BMI) 40.0 and over, adult: Secondary | ICD-10-CM | POA: Diagnosis not present

## 2017-07-16 DIAGNOSIS — E7800 Pure hypercholesterolemia, unspecified: Secondary | ICD-10-CM | POA: Diagnosis not present

## 2017-07-16 DIAGNOSIS — I471 Supraventricular tachycardia: Secondary | ICD-10-CM | POA: Diagnosis not present

## 2017-07-16 DIAGNOSIS — I10 Essential (primary) hypertension: Secondary | ICD-10-CM | POA: Diagnosis not present

## 2017-07-16 DIAGNOSIS — G4733 Obstructive sleep apnea (adult) (pediatric): Secondary | ICD-10-CM | POA: Diagnosis not present

## 2017-07-16 DIAGNOSIS — E119 Type 2 diabetes mellitus without complications: Secondary | ICD-10-CM | POA: Diagnosis not present

## 2017-07-16 DIAGNOSIS — N390 Urinary tract infection, site not specified: Secondary | ICD-10-CM | POA: Diagnosis not present

## 2017-07-16 DIAGNOSIS — Z170 Estrogen receptor positive status [ER+]: Secondary | ICD-10-CM | POA: Diagnosis not present

## 2017-07-16 DIAGNOSIS — C50912 Malignant neoplasm of unspecified site of left female breast: Secondary | ICD-10-CM | POA: Diagnosis not present

## 2017-07-16 MED FILL — dilTIAZem HCL 120 MG TABS: 120 | 90 days supply | Qty: 90 | Fill #0

## 2017-08-05 DIAGNOSIS — G4733 Obstructive sleep apnea (adult) (pediatric): Secondary | ICD-10-CM | POA: Diagnosis not present

## 2017-08-27 ENCOUNTER — Telehealth: Payer: Self-pay

## 2017-08-27 MED FILL — ROSUVASTATIN CALCIUM 20 MG: 20 | 90 days supply | Qty: 90 | Fill #0

## 2017-08-27 NOTE — Telephone Encounter (Signed)
LVM for pt reminding of SCP visit with NP on 09/04/17 at 2 pm.  Center call back left for questions.

## 2017-09-04 ENCOUNTER — Telehealth: Payer: Self-pay | Admitting: Hematology and Oncology

## 2017-09-04 ENCOUNTER — Encounter: Payer: Self-pay | Admitting: Adult Health

## 2017-09-04 ENCOUNTER — Inpatient Hospital Stay: Payer: 59 | Attending: Hematology and Oncology | Admitting: Adult Health

## 2017-09-04 VITALS — BP 122/71 | HR 87 | Temp 98.6°F | Resp 18 | Ht 67.0 in | Wt 276.6 lb

## 2017-09-04 DIAGNOSIS — Z800 Family history of malignant neoplasm of digestive organs: Secondary | ICD-10-CM | POA: Insufficient documentation

## 2017-09-04 DIAGNOSIS — E119 Type 2 diabetes mellitus without complications: Secondary | ICD-10-CM | POA: Diagnosis not present

## 2017-09-04 DIAGNOSIS — I10 Essential (primary) hypertension: Secondary | ICD-10-CM | POA: Diagnosis not present

## 2017-09-04 DIAGNOSIS — C50212 Malignant neoplasm of upper-inner quadrant of left female breast: Secondary | ICD-10-CM | POA: Diagnosis present

## 2017-09-04 DIAGNOSIS — N951 Menopausal and female climacteric states: Secondary | ICD-10-CM | POA: Diagnosis not present

## 2017-09-04 DIAGNOSIS — Z79811 Long term (current) use of aromatase inhibitors: Secondary | ICD-10-CM | POA: Insufficient documentation

## 2017-09-04 DIAGNOSIS — Z170 Estrogen receptor positive status [ER+]: Secondary | ICD-10-CM | POA: Insufficient documentation

## 2017-09-04 DIAGNOSIS — Z803 Family history of malignant neoplasm of breast: Secondary | ICD-10-CM | POA: Diagnosis not present

## 2017-09-04 DIAGNOSIS — Z923 Personal history of irradiation: Secondary | ICD-10-CM | POA: Insufficient documentation

## 2017-09-04 MED FILL — LOSARTAN-HCTZ 100-25 MG TAB: 100-25 | 90 days supply | Qty: 90 | Fill #0

## 2017-09-04 MED FILL — LETROZOLE 2.5 MG TABLET: 2.5 | 90 days supply | Qty: 90 | Fill #1

## 2017-09-04 MED FILL — METFORMIN HCL ER 750 MG TAB: 750 | 90 days supply | Qty: 90 | Fill #0

## 2017-09-04 NOTE — Telephone Encounter (Signed)
Gave avs and calendar ° °

## 2017-09-04 NOTE — Progress Notes (Signed)
CLINIC:  Survivorship   REASON FOR VISIT:  Routine follow-up post-treatment for a recent history of breast cancer.  BRIEF ONCOLOGIC HISTORY:    Malignant neoplasm of upper-inner quadrant of left breast in female, estrogen receptor positive ([place])   02/24/2017 Initial Diagnosis    Screening detected left breast mass UIQ 1130 position: 1.9 cm, ultrasound biopsy: IDC grade 2, DCIS, ER 100%, PR 20%, Ki-67 25%, HER-2 negative ratio 1.43, T1 cN0 stage I a AJCC 8    03/17/2017 Genetic Testing    Negative genetic testing on the 9-gene STAT panel.  The STAT Breast cancer panel offered by [facility] includes sequencing and rearrangement analysis for the following 9 genes:  ATM, BRCA1, BRCA2, CDH1, CHEK2, PALB2, PTEN, STK11 and TP53.   The report date is March 17, 2017.  Testing was reflexed to larger common hereditary cancer panel.    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  The Hereditary Gene Panel offered by [facility] includes sequencing and/or deletion duplication testing of the following 47 genes: APC, ATM, AXIN2, BARD1, BMPR1A, BRCA1, BRCA2, BRIP1, CDH1, CDK4, CDKN2A (p14ARF), CDKN2A (p16INK4a), CHEK2, CTNNA1, DICER1, EPCAM (Deletion/duplication testing only), GREM1 (promoter region deletion/duplication testing only), KIT, MEN1, MLH1, MSH2, MSH3, MSH6, MUTYH, NBN, NF1, NHTL1, PALB2, PDGFRA, PMS2, POLD1, POLE, PTEN, RAD50, RAD51C, RAD51D, SDHB, SDHC, SDHD, SMAD4, SMARCA4. STK11, TP53, TSC1, TSC2, and VHL.  The following genes were evaluated for sequence changes only: SDHA and HOXB13 c.251G>A variant only. The report date is March 19, 2017.     03/21/2017 Surgery    Left lumpectomy: IDC grade 2, 1.5 cm, DCIS intermediate grade, margins negative, intraductal papilloma and fibrocystic changes, 0/3 lymph nodes negative, ER 100%, PR 20%, HER-2 negative, Ki-67 25%, T1CN0 stage I a    04/10/2017 Oncotype testing    Oncotype DX recurrence score 20: 6% risk of recurrence with  hormone therapy    05/12/2017 - 06/06/2017 Radiation Therapy    Adjuvant radiation therapy: Left breast treated to 42.5 Gy with 17 fx of 2.5 Gy followed by a boost of 7.5 Gy with 3 fx of 2.5 Gy    05/2017 -  Anti-estrogen oral therapy    Letrozole daily     INTERVAL HISTORY:  Melody Zhang presents to the [facility] today for our initial meeting to review her survivorship care plan detailing her treatment course for breast cancer, as well as monitoring long-term side effects of that treatment, education regarding health maintenance, screening, and overall wellness and health promotion.     Overall, Melody Zhang reports feeling quite well.  She is taking Letrozole daily.  She is experiencing hot flashes, and these come on unexpectedly.  These are happening multiple times per day.  She has talked to her PCP about it and he recommends against gabapentin due to the swelling possibility.    She denies vaginal dryness.  She does have issues with her left knee, but she is going to have evaluation with ortho on Thursday.     REVIEW OF SYSTEMS:  Review of Systems  Constitutional: Positive for fatigue. Negative for appetite change, chills, fever and unexpected weight change.  HENT:   Negative for hearing loss, lump/mass and trouble swallowing.   Eyes: Negative for eye problems and icterus.  Respiratory: Negative for chest tightness, cough and shortness of breath.   Cardiovascular: Negative for chest pain, leg swelling and palpitations.  Gastrointestinal: Negative for abdominal distention, abdominal pain, constipation, diarrhea, nausea and vomiting.  Endocrine: Positive for hot flashes.  Skin: Negative for itching and rash.  Neurological: Negative for dizziness, extremity weakness, headaches and numbness.  Hematological: Negative for adenopathy. Does not bruise/bleed easily.  Psychiatric/Behavioral: Negative for depression. The patient is not nervous/anxious.   Breast: Denies any new nodularity,  masses, tenderness, nipple changes, or nipple discharge.      ONCOLOGY TREATMENT TEAM:  1. Surgeon:  Dr. Dalbert Batman at [facility] 2. Medical Oncologist: Dr. Lindi Adie  3. Radiation Oncologist: Dr. Lisbeth Renshaw    PAST MEDICAL/SURGICAL HISTORY:  Past Medical History:  Diagnosis Date  . Cancer [facility])    breast cancer  . Diabetes mellitus   . Family history of adverse reaction to anesthesia    Brother's heart stopped during surgery on urethra at [facility]  . Family history of breast cancer   . Family history of pancreatic cancer   . GERD (gastroesophageal reflux disease)    not on medication for this, TUMS PRN  . Heart murmur   . Hyperlipemia   . Hypertension   . Intermittent palpitations    once a month  . Paroxysmal SVT (supraventricular tachycardia) (HCC)    Myoview was performed 03/26/11: Low risk with small partially reversible apical perfusion defect likely breast attenuation, no ischemia, no scar, EF 62%.   . Sleep apnea    CPAP   Past Surgical History:  Procedure Laterality Date  . ABDOMINAL HYSTERECTOMY    . BREAST LUMPECTOMY WITH RADIOACTIVE SEED AND SENTINEL LYMPH NODE BIOPSY Left 03/21/2017   Procedure: LEFT BREAST LUMPECTOMY WITH RADIOACTIVE SEED AND LEFT AXILLARY DEEP LYMPH NODE BIOPSY INJECTION BLUE DYE LEFT BREAST ERAS PATHWAY;  Surgeon: Fanny Skates, MD;  Location: [place];  Service: General;  Laterality: Left;  . HERNIA REPAIR     umbilical hernia     ALLERGIES:  Allergies  Allergen Reactions  . Dobutamine Other (See Comments)    seizures  . Lisinopril Cough     CURRENT MEDICATIONS:  Outpatient Encounter Medications as of 09/04/2017  Medication Sig Note  . aspirin 81 MG EC tablet Take 81 mg by mouth every evening.   . calcium carbonate (TUMS - DOSED IN MG ELEMENTAL CALCIUM) 500 MG chewable tablet Chew 2 tablets by mouth 3 (three) times daily as needed for indigestion or heartburn.   . diltiazem (CARDIZEM) 120 MG tablet Take 120 mg by mouth every evening.    . ferrous sulfate 325 (65 FE) MG tablet Take 325 mg by mouth every evening.   Marland Kitchen ibuprofen (ADVIL,MOTRIN) 200 MG tablet Take 800 mg by mouth every 8 (eight) hours as needed (for pain.).   Marland Kitchen KLOR-CON M20 20 MEQ tablet TAKE ONE TABLET BY MOUTH EVERY DAY (Patient taking differently: TAKE ONE TABLET BY MOUTH EVERY DAY IN THE EVENING.)   . letrozole (FEMARA) 2.5 MG tablet Take 1 tablet (2.5 mg total) by mouth daily.   Marland Kitchen losartan-hydrochlorothiazide (HYZAAR) 100-25 MG tablet Take 1 tablet by mouth every evening.   . metFORMIN (GLUCOPHAGE-XR) 750 MG 24 hr tablet Take 750 mg by mouth every evening.   . nitroGLYCERIN (NITROSTAT) 0.4 MG SL tablet Place 1 tablet (0.4 mg total) under the tongue every 5 (five) minutes x 3 doses as needed for chest pain. (Patient not taking: Reported on 04/29/2017) 03/21/2017: Never use  . Probiotic Product (ACIDOPHILUS/BIFIDUS PO) Take 1 capsule by mouth every evening.    . rosuvastatin (CRESTOR) 20 MG tablet Take 20 mg by mouth every evening.   . vitamin B-12 (CYANOCOBALAMIN) 500 MCG tablet Take 1,000 mcg by mouth every  evening.    . vitamin C (ASCORBIC ACID) 500 MG tablet Take 500 mg by mouth every evening.     No facility-administered encounter medications on file as of 09/04/2017.      ONCOLOGIC FAMILY HISTORY:  Family History  Problem Relation Age of Onset  . Healthy Mother        There is no family history of arrythmia  . Multiple myeloma Mother 68  . Heart attack Father 66       There is no family history of arrythmia  . Lymphoma Maternal Aunt   . Liver cancer Maternal Uncle   . Pancreatic cancer Maternal Grandmother 64  . Pancreatic cancer Cousin        paternal first cousin  . Breast cancer Other        mat great aunt     GENETIC COUNSELING/TESTING: See above  SOCIAL HISTORY:  Social History   Socioeconomic History  . Marital status: Divorced    Spouse name: Not on file  . Number of children: Not on file  . Years of education: Not on file  .  Highest education level: Not on file  Occupational History  . Not on file  Social Needs  . Financial resource strain: Not on file  . Food insecurity:    Worry: Not on file    Inability: Not on file  . Transportation needs:    Medical: Not on file    Non-medical: Not on file  Tobacco Use  . Smoking status: Never Smoker  . Smokeless tobacco: Never Used  Substance and Sexual Activity  . Alcohol use: No  . Drug use: No  . Sexual activity: Never  Lifestyle  . Physical activity:    Days per week: Not on file    Minutes per session: Not on file  . Stress: Not on file  Relationships  . Social connections:    Talks on phone: Not on file    Gets together: Not on file    Attends religious service: Not on file    Active member of club or organization: Not on file    Attends meetings of clubs or organizations: Not on file    Relationship status: Not on file  . Intimate partner violence:    Fear of current or ex partner: Not on file    Emotionally abused: Not on file    Physically abused: Not on file    Forced sexual activity: Not on file  Other Topics Concern  . Not on file  Social History Narrative  . Not on file     PHYSICAL EXAMINATION:  Vital Signs:   Vitals:   09/04/17 1358  BP: 122/71  Pulse: 87  Resp: 18  Temp: 98.6 F (37 C)  SpO2: 97%   Filed Weights   09/04/17 1358  Weight: 276 lb 9.6 oz (125.5 kg)   General: Well-nourished, well-appearing female in no acute distress.  She is unaccompanied today.   HEENT: Head is normocephalic.  Pupils equal and reactive to light. Conjunctivae clear without exudate.  Sclerae anicteric. Oral mucosa is pink, moist.  Oropharynx is pink without lesions or erythema.  Lymph: No cervical, supraclavicular, or infraclavicular lymphadenopathy noted on palpation.  Cardiovascular: Regular rate and rhythm.Marland Kitchen Respiratory: Clear to auscultation bilaterally. Chest expansion symmetric; breathing non-labored.  GI: Abdomen soft and round;  non-tender, non-distended. Bowel sounds normoactive.  GU: Deferred.  Neuro: No focal deficits. Steady gait.  Psych: Mood and affect normal and appropriate for situation.  Extremities: No edema. MSK: No focal spinal tenderness to palpation.  Full range of motion in bilateral upper extremities Skin: Warm and dry.  LABORATORY DATA:  None for this visit.  DIAGNOSTIC IMAGING:  None for this visit.      ASSESSMENT AND PLAN:  Ms.. Zhang is a pleasant 62 y.o. female with Stage IA left breast invasive ductal carcinoma, ER+/PR+/HER2-, diagnosed in 01/2017, treated with lumpectomy, adjuvant radiation therapy, and anti-estrogen therapy with Letrozole beginning in 05/2017.  She presents to the Survivorship Clinic for our initial meeting and routine follow-up post-completion of treatment for breast cancer.    1. Stage IA left breast cancer:  Ms. Zhang is continuing to recover from definitive treatment for breast cancer. She will follow-up with her medical oncologist, Dr. Lindi Adie in 6 months with history and physical exam per surveillance protocol.  She will continue her anti-estrogen therapy with Letrozole. Thus far, she is tolerating the Letrozole well, with minimal side effects (see #2). Today, a comprehensive survivorship care plan and treatment summary was reviewed with the patient today detailing her breast cancer diagnosis, treatment course, potential late/long-term effects of treatment, appropriate follow-up care with recommendations for the future, and patient education resources.  A copy of this summary, along with a letter will be sent to the patient's primary care provider via mail/fax/In Basket message after today's visit.    2. Hot flashes: We reviewed non pharmacologic interventions for hot flashes and I gave her additional resources in her care plan.  We briefly reviewed the use of Gabapentin or Effexor for her hot flashes.  She is on several different medications, and has issues with her fluid  balance, so she plans on calling her PCP and reviewing these recommendations with him.    3. Bone health:  Given Melody Zhang's age/history of breast cancer and her current treatment regimen including anti-estrogen therapy with Letrozole, she is at risk for bone demineralization.  I went ahead and ordered her bone density test.  She was given education on specific activities to promote bone health.  4. Cancer screening:  Due to Melody Zhang's history and her age, she should receive screening for skin cancers, colon cancer, and gynecologic cancers.  The information and recommendations are listed on the patient's comprehensive care plan/treatment summary and were reviewed in detail with the patient.    5. Health maintenance and wellness promotion: Melody Zhang was encouraged to consume 5-7 servings of fruits and vegetables per day. We reviewed the "Nutrition Rainbow" handout, as well as the handout "Take Control of Your Health and Reduce Your Cancer Risk" from the [employer].  She was also encouraged to engage in moderate to vigorous exercise for 30 minutes per day most days of the week. We discussed the [employer] fitness program, which is designed for cancer survivors to help them become more physically fit after cancer treatments.  She was instructed to limit her alcohol consumption and continue to abstain from tobacco use.     6. Support services/counseling: It is not uncommon for this period of the patient's cancer care trajectory to be one of many emotions and stressors.  We discussed an opportunity for her to participate in the next session of [facility] ("Finding Your New Normal") support group series designed for patients after they have completed treatment.   Melody Zhang was encouraged to take advantage of our many other support services programs, support groups, and/or counseling in coping with her new life as a cancer survivor after completing anti-cancer treatment.  She was offered support  today through active listening and expressive supportive counseling.  She was given information regarding our available services and encouraged to contact me with any questions or for help enrolling in any of our support group/programs.    Dispo:   -Return to cancer center in 6 months for f/u with Dr. Lindi Adie  -Mammogram due in 01/2017 -Bone density ordered for this month -She is welcome to return back to the Survivorship Clinic at any time; no additional follow-up needed at this time.  -Consider referral back to survivorship as a long-term survivor for continued surveillance  A total of (30) minutes of face-to-face time was spent with this patient with greater than 50% of that time in counseling and care-coordination.   Gardenia Phlegm, NP Survivorship Program [place] [phone]   Note: PRIMARY CARE PROVIDER Maury Dus, [place] [phone]

## 2017-09-04 NOTE — Patient Instructions (Signed)
Bone Health Bones protect organs, store calcium, and anchor muscles. Good health habits, such as eating nutritious foods and exercising regularly, are important for maintaining healthy bones. They can also help to prevent a condition that causes bones to lose density and become weak and brittle (osteoporosis). Why is bone mass important? Bone mass refers to the amount of bone tissue that you have. The higher your bone mass, the stronger your bones. An important step toward having healthy bones throughout life is to have strong and dense bones during childhood. A young adult who has a high bone mass is more likely to have a high bone mass later in life. Bone mass at its greatest it is called peak bone mass. A large decline in bone mass occurs in older adults. In women, it occurs about the time of menopause. During this time, it is important to practice good health habits, because if more bone is lost than what is replaced, the bones will become less healthy and more likely to break (fracture). If you find that you have a low bone mass, you may be able to prevent osteoporosis or further bone loss by changing your diet and lifestyle. How can I find out if my bone mass is low? Bone mass can be measured with an X-ray test that is called a bone mineral density (BMD) test. This test is recommended for all women who are age 65 or older. It may also be recommended for men who are age 70 or older, or for people who are more likely to develop osteoporosis due to:  Having bones that break easily.  Having a long-term disease that weakens bones, such as kidney disease or rheumatoid arthritis.  Having menopause earlier than normal.  Taking medicine that weakens bones, such as steroids, thyroid hormones, or hormone treatment for breast cancer or prostate cancer.  Smoking.  Drinking three or more alcoholic drinks each day.  What are the nutritional recommendations for healthy bones? To have healthy bones, you  need to get enough of the right minerals and vitamins. Most nutrition experts recommend getting these nutrients from the foods that you eat. Nutritional recommendations vary from person to person. Ask your health care provider what is healthy for you. Here are some general guidelines. Calcium Recommendations Calcium is the most important (essential) mineral for bone health. Most people can get enough calcium from their diet, but supplements may be recommended for people who are at risk for osteoporosis. Good sources of calcium include:  Dairy products, such as low-fat or nonfat milk, cheese, and yogurt.  Dark green leafy vegetables, such as bok choy and broccoli.  Calcium-fortified foods, such as orange juice, cereal, bread, soy beverages, and tofu products.  Nuts, such as almonds.  Follow these recommended amounts for daily calcium intake:  Children, age 1?3: 700 mg.  Children, age 4?8: 1,000 mg.  Children, age 9?13: 1,300 mg.  Teens, age 14?18: 1,300 mg.  Adults, age 19?50: 1,000 mg.  Adults, age 51?70: ? Men: 1,000 mg. ? Women: 1,200 mg.  Adults, age 71 or older: 1,200 mg.  Pregnant and breastfeeding females: ? Teens: 1,300 mg. ? Adults: 1,000 mg.  Vitamin D Recommendations Vitamin D is the most essential vitamin for bone health. It helps the body to absorb calcium. Sunlight stimulates the skin to make vitamin D, so be sure to get enough sunlight. If you live in a cold climate or you do not get outside often, your health care provider may recommend that you take vitamin   D supplements. Good sources of vitamin D in your diet include:  Egg yolks.  Saltwater fish.  Milk and cereal fortified with vitamin D.  Follow these recommended amounts for daily vitamin D intake:  Children and teens, age 1?18: 600 international units.  Adults, age 50 or younger: 400-800 international units.  Adults, age 51 or older: 800-1,000 international units.  Other Nutrients Other nutrients  for bone health include:  Phosphorus. This mineral is found in meat, poultry, dairy foods, nuts, and legumes. The recommended daily intake for adult men and adult women is 700 mg.  Magnesium. This mineral is found in seeds, nuts, dark green vegetables, and legumes. The recommended daily intake for adult men is 400?420 mg. For adult women, it is 310?320 mg.  Vitamin K. This vitamin is found in green leafy vegetables. The recommended daily intake is 120 mg for adult men and 90 mg for adult women.  What type of physical activity is best for building and maintaining healthy bones? Weight-bearing and strength-building activities are important for building and maintaining peak bone mass. Weight-bearing activities cause muscles and bones to work against gravity. Strength-building activities increases muscle strength that supports bones. Weight-bearing and muscle-building activities include:  Walking and hiking.  Jogging and running.  Dancing.  Gym exercises.  Lifting weights.  Tennis and racquetball.  Climbing stairs.  Aerobics.  Adults should get at least 30 minutes of moderate physical activity on most days. Children should get at least 60 minutes of moderate physical activity on most days. Ask your health care provide what type of exercise is best for you. Where can I find more information? For more information, check out the following websites:  National Osteoporosis Foundation: http://nof.org/learn/basics  National Institutes of Health: http://www.niams.nih.gov/Health_Info/Bone/Bone_Health/bone_health_for_life.asp  This information is not intended to replace advice given to you by your health care provider. Make sure you discuss any questions you have with your health care provider. Document Released: 04/06/2003 Document Revised: 08/04/2015 Document Reviewed: 01/19/2014 Elsevier Interactive Patient Education  2018 [employer]  

## 2017-09-09 ENCOUNTER — Ambulatory Visit (INDEPENDENT_AMBULATORY_CARE_PROVIDER_SITE_OTHER): Payer: 59

## 2017-09-09 ENCOUNTER — Encounter (INDEPENDENT_AMBULATORY_CARE_PROVIDER_SITE_OTHER): Payer: Self-pay | Admitting: Orthopaedic Surgery

## 2017-09-09 ENCOUNTER — Ambulatory Visit (INDEPENDENT_AMBULATORY_CARE_PROVIDER_SITE_OTHER): Payer: 59 | Admitting: Orthopaedic Surgery

## 2017-09-09 VITALS — BP 138/81 | HR 86 | Resp 16 | Ht 67.0 in | Wt 274.0 lb

## 2017-09-09 DIAGNOSIS — M25562 Pain in left knee: Secondary | ICD-10-CM | POA: Diagnosis not present

## 2017-09-09 DIAGNOSIS — G8929 Other chronic pain: Secondary | ICD-10-CM

## 2017-09-09 DIAGNOSIS — M1712 Unilateral primary osteoarthritis, left knee: Secondary | ICD-10-CM | POA: Diagnosis not present

## 2017-09-09 MED ORDER — BUPIVACAINE HCL 0.5 % IJ SOLN
2.0000 mL | INTRAMUSCULAR | Status: AC | PRN
  Administered 2017-09-09: 2 mL via INTRA_ARTICULAR

## 2017-09-09 MED ORDER — LIDOCAINE HCL 1 % IJ SOLN
2.0000 mL | INTRAMUSCULAR | Status: AC | PRN
  Administered 2017-09-09: 2 mL

## 2017-09-09 MED ORDER — METHYLPREDNISOLONE ACETATE 40 MG/ML IJ SUSP
80.0000 mg | INTRAMUSCULAR | Status: AC | PRN
  Administered 2017-09-09: 80 mg
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
## 2017-09-24 MED FILL — POTASSIUM CL ER 20 MEQ TAB: 20 | 90 days supply | Qty: 90 | Fill #0

## 2017-10-22 MED FILL — dilTIAZem HCL 120 MG TABS: 120 | 90 days supply | Qty: 90 | Fill #1

## 2017-11-05 ENCOUNTER — Encounter (INDEPENDENT_AMBULATORY_CARE_PROVIDER_SITE_OTHER): Payer: Self-pay | Admitting: Orthopedic Surgery

## 2017-11-05 ENCOUNTER — Ambulatory Visit (INDEPENDENT_AMBULATORY_CARE_PROVIDER_SITE_OTHER): Payer: 59 | Admitting: Orthopedic Surgery

## 2017-11-05 VITALS — BP 137/80 | HR 67 | Resp 15 | Ht 67.0 in | Wt 270.0 lb

## 2017-11-05 DIAGNOSIS — M1712 Unilateral primary osteoarthritis, left knee: Secondary | ICD-10-CM

## 2017-11-05 MED ORDER — DICLOFENAC SODIUM 1 % TD GEL: 2.0000 g | Freq: Four times a day (QID) | TRANSDERMAL | 3 refills | Status: DC

## 2017-11-05 MED ORDER — LIDOCAINE HCL 1 % IJ SOLN
2.0000 mL | INTRAMUSCULAR | Status: AC | PRN
  Administered 2017-11-05: 2 mL

## 2017-11-05 MED ORDER — BUPIVACAINE HCL 0.5 % IJ SOLN
2.0000 mL | INTRAMUSCULAR | Status: AC | PRN
  Administered 2017-11-05: 2 mL via INTRA_ARTICULAR

## 2017-11-05 MED ORDER — METHYLPREDNISOLONE ACETATE 40 MG/ML IJ SUSP
80.0000 mg | INTRAMUSCULAR | Status: AC | PRN
  Administered 2017-11-05: 80 mg

## 2017-11-05 MED FILL — DICLOFENAC SODIUM 1% GEL: 1 | 30 days supply | Qty: 500 | Fill #0

## 2017-11-05 NOTE — Progress Notes (Signed)
Office Visit Note   Patient: Melody Zhang           Date of Birth: [birth year]           MRN: [id number] Visit Date: 11/05/2017              Requested by: Maury Dus, MD [place], [place] [address] PCP: Maury Dus, MD   Assessment & Plan: Visit Diagnoses:  1. Primary osteoarthritis of left knee     Plan:  #1: Corticosteroid injection left knee traumatically. #2: She will stay with conservative treatment.  She should not have another injection for at least 4 to 6 months after this one. #3: Prescription for Voltaren gel was given  Follow-Up Instructions: No follow-ups on file.   Orders:  Orders Placed This Encounter  Procedures  . Large Joint Inj: L knee   Meds ordered this encounter  Medications  . diclofenac sodium (VOLTAREN) 1 % GEL    Sig: Apply 2-4 g topically 4 (four) times daily.    Dispense:  5 Tube    Refill:  3    Order Specific Question:   Supervising Provider    Answer:   Garald Balding [[id number]]      Procedures: Large Joint Inj: L knee on 11/05/2017 10:42 AM Indications: pain and diagnostic evaluation Details: 25 G 1.5 in needle, anteromedial approach  Arthrogram: No  Medications: 2 mL lidocaine 1 %; 2 mL bupivacaine 0.5 %; 80 mg methylPREDNISolone acetate 40 MG/ML Procedure, treatment alternatives, risks and benefits explained, specific risks discussed. Consent was given by the patient. Patient was prepped and draped in the usual sterile fashion.       Clinical Data: No additional findings.   Subjective: Chief Complaint  Patient presents with  . 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  She had developed left knee pain in April with pain radiating more to the medial aspect of her proximal tibia and it worsened after her radiation for breast cancer.  At that time an x-ray revealed marked joint  space narrowing of the medial compartment.  A corticosteroid injection was given which gave her good relief.  She certainly is on her feet a lot in the ER and this is starting to worsen her symptoms.  She comes in today requesting cortisone injection.  .Review of Systems  Constitutional: Negative for chills, fatigue and fever.  HENT: Negative for ear pain and tinnitus.   Eyes: Negative for pain and redness.  Respiratory: Negative for cough, shortness of breath and wheezing.   Cardiovascular: Negative for chest pain.  Gastrointestinal: Negative for abdominal pain, constipation and diarrhea.  Endocrine: Negative for polyuria.  Genitourinary: Negative for difficulty urinating, pelvic pain and urgency.  Musculoskeletal: Positive for gait problem and joint swelling.  Skin: Negative for rash.  Allergic/Immunologic: Negative for food allergies.  Neurological: Negative for dizziness, facial asymmetry and headaches.  Hematological: Does not bruise/bleed easily.  Psychiatric/Behavioral: Negative for confusion. The patient is not nervous/anxious.      Objective: Vital Signs: BP 137/80 (BP Location: Right Arm, Patient Position: Sitting, Cuff Size: Large)   Pulse 67   Resp 15   Ht _0  (1.702 m)   BMI 42.91 kg/m   Physical Exam  Constitutional: She is oriented to person, place, and time. She appears well-developed and well-nourished.  HENT:  Mouth/Throat: Oropharynx  is clear and moist.  Eyes: Pupils are equal, round, and reactive to light. EOM are normal.  Pulmonary/Chest: Effort normal.  Neurological: She is alert and oriented to person, place, and time.  Skin: Skin is warm and dry.  Psychiatric: She has a normal mood and affect. Her behavior is normal.    Ortho Exam  Exam today of the left knee reveals a mild effusion.  No warmth or erythema.  Range of motion from near full extension to about 105 degrees.  No ecchymosis noted.  Tender over the medial joint line.  Negative drawer.  Calf  is supple and nontender.  Neurovascular intact distally.  Good hip motion.  Specialty Comments:  No specialty comments available.  Imaging: No results found.   PMFS History: Current Outpatient Medications  Medication Sig Dispense Refill  . aspirin 81 MG EC tablet Take 81 mg by mouth every evening.    . calcium carbonate (TUMS - DOSED IN MG ELEMENTAL CALCIUM) 500 MG chewable tablet Chew 2 tablets by mouth 3 (three) times daily as needed for indigestion or heartburn.    . diltiazem (CARDIZEM) 120 MG tablet Take 120 mg by mouth every evening.  0  . ibuprofen (ADVIL,MOTRIN) 200 MG tablet Take 800 mg by mouth every 8 (eight) hours as needed (for pain.).    Marland Kitchen KLOR-CON M20 20 MEQ tablet TAKE ONE TABLET BY MOUTH EVERY DAY (Patient taking differently: TAKE ONE TABLET BY MOUTH EVERY DAY IN THE EVENING.) 15 tablet 0  . letrozole (FEMARA) 2.5 MG tablet Take 1 tablet (2.5 mg total) by mouth daily. 90 tablet 3  . losartan-hydrochlorothiazide (HYZAAR) 100-25 MG tablet Take 1 tablet by mouth every evening.  1  . metFORMIN (GLUCOPHAGE-XR) 750 MG 24 hr tablet Take 750 mg by mouth every evening.  0  . nitroGLYCERIN (NITROSTAT) 0.4 MG SL tablet Place 1 tablet (0.4 mg total) under the tongue every 5 (five) minutes x 3 doses as needed for chest pain. 25 tablet 3  . Probiotic Product (ACIDOPHILUS/BIFIDUS PO) Take 1 capsule by mouth every evening.     . rosuvastatin (CRESTOR) 20 MG tablet Take 20 mg by mouth every evening.    . vitamin B-12 (CYANOCOBALAMIN) 500 MCG tablet Take 1,000 mcg by mouth every evening.     . vitamin C (ASCORBIC ACID) 500 MG tablet Take 500 mg by mouth every evening.     . diclofenac sodium (VOLTAREN) 1 % GEL Apply 2-4 g topically 4 (four) times daily. 5 Tube 3  . ferrous sulfate 325 (65 FE) MG tablet Take 325 mg by mouth every evening.     No current facility-administered medications for this visit.     Patient Active Problem List   Diagnosis Date Noted  . Genetic testing 03/18/2017   . Family history of breast cancer   . Family history of pancreatic cancer   . Malignant neoplasm of upper-inner quadrant of left breast in female, estrogen receptor positive ([place]) 03/04/2017  . SVT (supraventricular tachycardia) ([place]) 03/18/2011  . Hypokalemia 03/18/2011  . Diabetes mellitus 03/18/2011  . HYPERLIPIDEMIA 01/30/2007  . HYPERTENSION 01/30/2007  . ALLERGIC RHINITIS 01/30/2007   Past Medical History:  Diagnosis Date  . Cancer [facility])    breast cancer  . Diabetes mellitus   . Family history of adverse reaction to anesthesia    Brother's heart stopped during surgery on urethra at [facility]  . Family history of breast cancer   . Family history of pancreatic cancer   . GERD (gastroesophageal  reflux disease)    not on medication for this, TUMS PRN  . Heart murmur   . Hyperlipemia   . Hypertension   . Intermittent palpitations    once a month  . Paroxysmal SVT (supraventricular tachycardia) ([facility])    Myoview was performed 03/26/11: Low risk with small partially reversible apical perfusion defect likely breast attenuation, no ischemia, no scar, EF 62%.   . Sleep apnea    CPAP    Family History  Problem Relation Age of Onset  . Healthy Mother        There is no family history of arrythmia  . Multiple myeloma Mother 46  . Heart attack Father 60       There is no family history of arrythmia  . Lymphoma Maternal Aunt   . Liver cancer Maternal Uncle   . Pancreatic cancer Maternal Grandmother 56  . Pancreatic cancer Cousin        paternal first cousin  . Breast cancer Other        mat great aunt    Past Surgical History:  Procedure Laterality Date  . ABDOMINAL HYSTERECTOMY    . BREAST LUMPECTOMY WITH RADIOACTIVE SEED AND SENTINEL LYMPH NODE BIOPSY Left 03/21/2017   Procedure: LEFT BREAST LUMPECTOMY WITH RADIOACTIVE SEED AND LEFT AXILLARY DEEP LYMPH NODE BIOPSY INJECTION BLUE DYE LEFT BREAST ERAS PATHWAY;  Surgeon: Fanny Skates, MD;  Location: [facility];  Service: General;   Laterality: Left;  . HERNIA REPAIR     umbilical hernia   Social History   Occupational History  . Not on file  Tobacco Use  . Smoking status: Never Smoker  . Smokeless tobacco: Never Used  Substance and Sexual Activity  . Alcohol use: No  . Drug use: No  . Sexual activity: Never

## 2017-12-01 MED FILL — ROSUVASTATIN CALCIUM 20 MG: 20 | 90 days supply | Qty: 90 | Fill #1

## 2017-12-03 MED FILL — LETROZOLE 2.5 MG TABLET: 2.5 | 90 days supply | Qty: 90 | Fill #2

## 2017-12-03 MED FILL — LOSARTAN-HCTZ 100-25 MG TAB: 100-25 | 90 days supply | Qty: 90 | Fill #1

## 2017-12-03 MED FILL — METFORMIN HCL ER 750 MG TAB: 750 | 90 days supply | Qty: 90 | Fill #1

## 2017-12-03 MED FILL — DICLOFENAC SODIUM 1% GEL: 1 | 30 days supply | Qty: 500 | Fill #1

## 2017-12-04 DIAGNOSIS — C50212 Malignant neoplasm of upper-inner quadrant of left female breast: Secondary | ICD-10-CM | POA: Diagnosis not present

## 2017-12-04 DIAGNOSIS — E119 Type 2 diabetes mellitus without complications: Secondary | ICD-10-CM | POA: Diagnosis not present

## 2017-12-04 DIAGNOSIS — Z90710 Acquired absence of both cervix and uterus: Secondary | ICD-10-CM | POA: Diagnosis not present

## 2017-12-04 DIAGNOSIS — I10 Essential (primary) hypertension: Secondary | ICD-10-CM | POA: Diagnosis not present

## 2017-12-23 ENCOUNTER — Encounter (HOSPITAL_COMMUNITY): Payer: Self-pay | Admitting: Emergency Medicine

## 2017-12-23 ENCOUNTER — Ambulatory Visit (HOSPITAL_BASED_OUTPATIENT_CLINIC_OR_DEPARTMENT_OTHER)
Admission: RE | Admit: 2017-12-23 | Discharge: 2017-12-23 | Disposition: A | Discharge status: Home / Self Care | Payer: 59 | Source: Ambulatory Visit

## 2017-12-23 ENCOUNTER — Ambulatory Visit (HOSPITAL_COMMUNITY)
Admission: EM | Admit: 2017-12-23 | Discharge: 2017-12-23 | Disposition: A | Discharge status: Home / Self Care | Payer: 59 | Attending: Family Medicine | Admitting: Family Medicine

## 2017-12-23 DIAGNOSIS — M79604 Pain in right leg: Secondary | ICD-10-CM | POA: Diagnosis not present

## 2017-12-23 DIAGNOSIS — R609 Edema, unspecified: Secondary | ICD-10-CM | POA: Diagnosis not present

## 2017-12-23 DIAGNOSIS — M79609 Pain in unspecified limb: Secondary | ICD-10-CM | POA: Diagnosis not present

## 2017-12-23 DIAGNOSIS — Z9889 Other specified postprocedural states: Secondary | ICD-10-CM | POA: Insufficient documentation

## 2017-12-23 DIAGNOSIS — Z7984 Long term (current) use of oral hypoglycemic drugs: Secondary | ICD-10-CM | POA: Diagnosis not present

## 2017-12-23 DIAGNOSIS — E785 Hyperlipidemia, unspecified: Secondary | ICD-10-CM | POA: Diagnosis not present

## 2017-12-23 DIAGNOSIS — I82401 Acute embolism and thrombosis of unspecified deep veins of right lower extremity: Secondary | ICD-10-CM | POA: Diagnosis not present

## 2017-12-23 DIAGNOSIS — E119 Type 2 diabetes mellitus without complications: Secondary | ICD-10-CM | POA: Insufficient documentation

## 2017-12-23 DIAGNOSIS — M7989 Other specified soft tissue disorders: Secondary | ICD-10-CM

## 2017-12-23 DIAGNOSIS — I10 Essential (primary) hypertension: Secondary | ICD-10-CM | POA: Insufficient documentation

## 2017-12-23 DIAGNOSIS — Z888 Allergy status to other drugs, medicaments and biological substances status: Secondary | ICD-10-CM | POA: Diagnosis not present

## 2017-12-23 DIAGNOSIS — Z79899 Other long term (current) drug therapy: Secondary | ICD-10-CM | POA: Diagnosis not present

## 2017-12-23 DIAGNOSIS — Z8249 Family history of ischemic heart disease and other diseases of the circulatory system: Secondary | ICD-10-CM | POA: Diagnosis not present

## 2017-12-23 DIAGNOSIS — Z7982 Long term (current) use of aspirin: Secondary | ICD-10-CM | POA: Diagnosis not present

## 2017-12-23 DIAGNOSIS — G4730 Sleep apnea, unspecified: Secondary | ICD-10-CM | POA: Diagnosis not present

## 2017-12-23 MED ORDER — RIVAROXABAN (XARELTO) VTE STARTER PACK (15 & 20 MG): ORAL_TABLET | ORAL | 0 refills | Status: DC

## 2017-12-23 MED FILL — XARELTO STARTER PACK: 15 & 20 | 30 days supply | Qty: 51 | Fill #0

## 2017-12-23 NOTE — Discharge Instructions (Signed)
GO FOR ULTRASOUND

## 2017-12-23 NOTE — Progress Notes (Signed)
Preliminary notes--Right lower extremity venous duplex exam completed. Positive Acute deep vein thrombosis involving right posterior tibial veins mid to distally.  Result called the ordering physician and patient was instructed to pick up her prescription at the pharmacy.  Selina H Cole(RDMS RVT) 12/23/17 5:38 PM

## 2017-12-23 NOTE — ED Provider Notes (Signed)
[place]    CSN: [id number] Arrival date & time: 12/23/17  1532     History   Chief Complaint Chief Complaint  Patient presents with  . Leg Pain    appt 1530    HPI Melody Zhang is a 62 y.o. female.   HPI  Patient is here for right leg pain and swelling.  She states that he was standing up on a chair to reach into an upper cabinet when the chair broke and fell.  She states the arm of the chair hit across her calf on the right lower leg.  Its been tense, tender, swollen.  For the last 2 days she has had increased swelling in her leg in general and in her ankle.  She has a mild limp with walking.  Chest pain, shortness of breath.  No history of DVT or clot. She did have breast cancer this year.  Chemotherapy.  Surgery.  She is still on Femara. Non-smoker  Past Medical History:  Diagnosis Date  . Cancer [facility])    breast cancer  . Diabetes mellitus   . Family history of adverse reaction to anesthesia    Brother's heart stopped during surgery on urethra at [facility]  . Family history of breast cancer   . Family history of pancreatic cancer   . GERD (gastroesophageal reflux disease)    not on medication for this, TUMS PRN  . Heart murmur   . Hyperlipemia   . Hypertension   . Intermittent palpitations    once a month  . Paroxysmal SVT (supraventricular tachycardia) (HCC)    Myoview was performed 03/26/11: Low risk with small partially reversible apical perfusion defect likely breast attenuation, no ischemia, no scar, EF 62%.   . Sleep apnea    CPAP    Patient Active Problem List   Diagnosis Date Noted  . Genetic testing 03/18/2017  . Family history of breast cancer   . Family history of pancreatic cancer   . Malignant neoplasm of upper-inner quadrant of left breast in female, estrogen receptor positive ([place]) 03/04/2017  . SVT (supraventricular tachycardia) ([place]) 03/18/2011  . Hypokalemia 03/18/2011  . Diabetes mellitus 03/18/2011  . HYPERLIPIDEMIA  01/30/2007  . HYPERTENSION 01/30/2007  . ALLERGIC RHINITIS 01/30/2007    Past Surgical History:  Procedure Laterality Date  . ABDOMINAL HYSTERECTOMY    . BREAST LUMPECTOMY WITH RADIOACTIVE SEED AND SENTINEL LYMPH NODE BIOPSY Left 03/21/2017   Procedure: LEFT BREAST LUMPECTOMY WITH RADIOACTIVE SEED AND LEFT AXILLARY DEEP LYMPH NODE BIOPSY INJECTION BLUE DYE LEFT BREAST ERAS PATHWAY;  Surgeon: Fanny Skates, MD;  Location: [facility];  Service: General;  Laterality: Left;  . HERNIA REPAIR     umbilical hernia    OB History   None      Home Medications    Prior to Admission medications   Medication Sig Start Date End Date Taking? Authorizing Provider  aspirin 81 MG EC tablet Take 81 mg by mouth every evening.    [provider]  calcium carbonate (TUMS - DOSED IN MG ELEMENTAL CALCIUM) 500 MG chewable tablet Chew 2 tablets by mouth 3 (three) times daily as needed for indigestion or heartburn.    [provider]  diclofenac sodium (VOLTAREN) 1 % GEL Apply 2-4 g topically 4 (four) times daily. 11/05/17   Cherylann Ratel, PA-C  diltiazem (CARDIZEM) 120 MG tablet Take 120 mg by mouth every evening. 12/16/16   [provider]  ferrous sulfate 325 (65 FE) MG tablet  Take 325 mg by mouth every evening.    [provider]  ibuprofen (ADVIL,MOTRIN) 200 MG tablet Take 800 mg by mouth every 8 (eight) hours as needed (for pain.).    [provider]  KLOR-CON M20 20 MEQ tablet TAKE ONE TABLET BY MOUTH EVERY DAY Patient taking differently: TAKE ONE TABLET BY MOUTH EVERY DAY IN THE EVENING. 03/05/13   Allred, Jeneen Rinks, MD  letrozole [facility]) 2.5 MG tablet Take 1 tablet (2.5 mg total) by mouth daily. 06/03/17   Nicholas Lose, MD  losartan-hydrochlorothiazide (HYZAAR) 100-25 MG tablet Take 1 tablet by mouth every evening. 02/26/17   [provider]  metFORMIN (GLUCOPHAGE-XR) 750 MG 24 hr tablet Take 750 mg by mouth every evening. 12/16/16   [provider]  nitroGLYCERIN (NITROSTAT) 0.4 MG SL tablet Place 1 tablet (0.4 mg total) under the tongue every 5 (five) minutes x 3 doses as needed for chest pain. 03/18/11 03/15/18  Theora Gianotti, NP  Probiotic Product (ACIDOPHILUS/BIFIDUS PO) Take 1 capsule by mouth every evening.     [provider]  Rivaroxaban 15 & 20 MG TBPK Take as directed on package: Start with one 89m tablet by mouth twice a day with food. On Day 22, switch to one 239mtablet once a day 12/23/17   NeRaylene EvertsMD  rosuvastatin (CRESTOR) 20 MG tablet Take 20 mg by mouth every evening.    [provider]  vitamin B-12 (CYANOCOBALAMIN) 500 MCG tablet Take 1,000 mcg by mouth every evening.     [provider]  vitamin C (ASCORBIC ACID) 500 MG tablet Take 500 mg by mouth every evening.     [provider]    Family History Family History  Problem Relation Age of Onset  . Healthy Mother        There is no family history of arrythmia  . Multiple myeloma Mother 5475. Heart attack Father 4838     There is no family history of arrythmia  . Lymphoma Maternal Aunt   . Liver cancer Maternal Uncle   . Pancreatic cancer Maternal Grandmother 7031. Pancreatic cancer Cousin        paternal first cousin  . Breast cancer Other        mat great aunt    Social History Social History   Tobacco Use  . Smoking status: Never Smoker  . Smokeless tobacco: Never Used  Substance Use Topics  . Alcohol use: No  . Drug use: No     Allergies   Dobutamine and Lisinopril   Review of Systems Review of Systems  Constitutional: Negative for chills and fever.  HENT: Negative for ear pain and sore throat.   Eyes: Negative for pain and visual disturbance.  Respiratory: Negative for cough and shortness of breath.   Cardiovascular: Positive for leg swelling. Negative for chest pain and palpitations.  Gastrointestinal: Negative for abdominal pain and vomiting.  Genitourinary: Negative for  dysuria and hematuria.  Musculoskeletal: Negative for arthralgias and back pain.  Skin: Negative for color change and rash.  Neurological: Negative for seizures and syncope.  Psychiatric/Behavioral: Negative for dysphoric mood. The patient is not nervous/anxious.   All other systems reviewed and are negative.    Physical Exam Triage Vital Signs ED Triage Vitals  Enc Vitals Group     BP 12/23/17 1612 (!) 157/94     Pulse Rate 12/23/17 1612 92     Resp 12/23/17 1612 18  Temp 12/23/17 1612 98.4 F (36.9 C)     Temp Source 12/23/17 1612 Oral     SpO2 12/23/17 1612 95 %     Weight --      Height --      Head Circumference --      Peak Flow --      Pain Score 12/23/17 1613 7     Pain Loc --      Pain Edu? --      Excl. in [place]? --    No data found.  Updated Vital Signs BP (!) 157/94 (BP Location: Left Arm)   Pulse 92   Temp 98.4 F (36.9 C) (Oral)   Resp 18   SpO2 95%      Physical Exam  Constitutional: She appears well-developed and well-nourished. No distress.  HENT:  Head: Normocephalic and atraumatic.  Mouth/Throat: Oropharynx is clear and moist.  Eyes: Pupils are equal, round, and reactive to light. Conjunctivae are normal.  Neck: Normal range of motion.  Cardiovascular: Normal rate.  Pulmonary/Chest: Effort normal. No respiratory distress.  Abdominal: Soft. She exhibits no distension.  Musculoskeletal: Normal range of motion. She exhibits edema.       Legs: Neurological: She is alert.  Skin: Skin is warm and dry.  Psychiatric: She has a normal mood and affect. Her behavior is normal.     UC Treatments / Results  Labs (all labs ordered are listed, but only abnormal results are displayed) Labs Reviewed - No data to display  EKG None  Radiology No results found.  Procedures Procedures (including critical care time)  Medications Ordered in UC Medications - No data to display  Initial Impression / Assessment and Plan / UC Course  I have reviewed  the triage vital signs and the nursing notes.  Pertinent labs & imaging results that were available during my care of the patient were reviewed by me and considered in my medical decision making (see chart for details).     I had a discussion with the patient that this could just be contusion that is taking a long time to heal.  I do not see evidence of infection although there is redness warmth and swelling.  I am concerned about a possible DVT given her risk factors, and the increasing pedal edema.  Surprisingly, it is not terribly painful unless palpated. Final Clinical Impressions(s) / UC Diagnoses   Final diagnoses:  Right leg pain  Acute deep vein thrombosis (DVT) of right lower extremity, unspecified vein (HCC)     Discharge Instructions     GO FOR ULTRASOUND   ED Prescriptions    Medication Sig Dispense Auth. Provider   Rivaroxaban 15 & 20 MG TBPK Take as directed on package: Start with one 91m tablet by mouth twice a day with food. On Day 22, switch to one 216mtablet once a day 51 each NeMeda CoffeevJennette BankerMD     Controlled Substance Prescriptions [place] Controlled Substance Registry consulted? Not Applicable  Addendum: Patient went over for a stat ultrasound.  She did have a DVT.  She was started on Xarelto.  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  She is advised to call her primary care doctor in the morning for follow-up.  I sent in the Xarelto starter pack which will get her going for the first month.   NeRaylene EvertsMD 12/23/17 [phone]

## 2017-12-23 NOTE — ED Triage Notes (Signed)
Pt here for right leg pain, redness and swelling after falling last Thursday and hitting on a chair

## 2017-12-24 ENCOUNTER — Telehealth (HOSPITAL_COMMUNITY): Payer: Self-pay | Admitting: Emergency Medicine

## 2017-12-24 NOTE — Telephone Encounter (Signed)
Dr. Meda Coffee aware of positive DVT and sent in medication. Pt called to be sure she picked it up, she has picked it up and is taking it, has pcp appt on Monday. All questions answered.

## 2017-12-29 DIAGNOSIS — Z6841 Body Mass Index (BMI) 40.0 and over, adult: Secondary | ICD-10-CM | POA: Diagnosis not present

## 2017-12-29 DIAGNOSIS — I10 Essential (primary) hypertension: Secondary | ICD-10-CM | POA: Diagnosis not present

## 2017-12-29 DIAGNOSIS — I82461 Acute embolism and thrombosis of right calf muscular vein: Secondary | ICD-10-CM | POA: Diagnosis not present

## 2018-01-07 MED FILL — POTASSIUM CHLORIDE CRYS ER: 20 | 90 days supply | Qty: 90 | Fill #1

## 2018-01-12 MED FILL — XARELTO 20 MG TABLET: 20 | 30 days supply | Qty: 30 | Fill #0

## 2018-01-14 DIAGNOSIS — G4733 Obstructive sleep apnea (adult) (pediatric): Secondary | ICD-10-CM | POA: Diagnosis not present

## 2018-01-27 MED FILL — dilTIAZem HCL 120 MG TABS: 120 | 90 days supply | Qty: 90 | Fill #0

## 2018-02-03 DIAGNOSIS — E7800 Pure hypercholesterolemia, unspecified: Secondary | ICD-10-CM | POA: Diagnosis not present

## 2018-02-03 DIAGNOSIS — C50912 Malignant neoplasm of unspecified site of left female breast: Secondary | ICD-10-CM | POA: Diagnosis not present

## 2018-02-03 DIAGNOSIS — E119 Type 2 diabetes mellitus without complications: Secondary | ICD-10-CM | POA: Diagnosis not present

## 2018-02-03 DIAGNOSIS — Z6841 Body Mass Index (BMI) 40.0 and over, adult: Secondary | ICD-10-CM | POA: Diagnosis not present

## 2018-02-03 DIAGNOSIS — I471 Supraventricular tachycardia: Secondary | ICD-10-CM | POA: Diagnosis not present

## 2018-02-03 DIAGNOSIS — G4733 Obstructive sleep apnea (adult) (pediatric): Secondary | ICD-10-CM | POA: Diagnosis not present

## 2018-02-03 DIAGNOSIS — Z0000 Encounter for general adult medical examination without abnormal findings: Secondary | ICD-10-CM | POA: Diagnosis not present

## 2018-02-03 DIAGNOSIS — I10 Essential (primary) hypertension: Secondary | ICD-10-CM | POA: Diagnosis not present

## 2018-02-18 ENCOUNTER — Ambulatory Visit
Admission: RE | Admit: 2018-02-18 | Discharge: 2018-02-18 | Disposition: A | Discharge status: Home / Self Care | Payer: 59 | Source: Ambulatory Visit | Attending: Adult Health | Admitting: Adult Health

## 2018-02-18 ENCOUNTER — Telehealth: Payer: Self-pay

## 2018-02-18 DIAGNOSIS — Z170 Estrogen receptor positive status [ER+]: Secondary | ICD-10-CM

## 2018-02-18 DIAGNOSIS — C50212 Malignant neoplasm of upper-inner quadrant of left female breast: Secondary | ICD-10-CM

## 2018-02-18 DIAGNOSIS — Z853 Personal history of malignant neoplasm of breast: Secondary | ICD-10-CM | POA: Diagnosis not present

## 2018-02-18 DIAGNOSIS — R928 Other abnormal and inconclusive findings on diagnostic imaging of breast: Secondary | ICD-10-CM | POA: Diagnosis not present

## 2018-02-18 DIAGNOSIS — Z13820 Encounter for screening for osteoporosis: Secondary | ICD-10-CM | POA: Diagnosis not present

## 2018-02-18 DIAGNOSIS — Z780 Asymptomatic menopausal state: Secondary | ICD-10-CM | POA: Diagnosis not present

## 2018-02-18 HISTORY — DX: Personal history of irradiation: Z92.3

## 2018-02-18 NOTE — Telephone Encounter (Signed)
Spoke with patient to inform her that bone density results are normal.  Patient voiced understanding and thanks for call.  No questions/concerns at this time.

## 2018-02-18 NOTE — Telephone Encounter (Signed)
-----   Message from Gardenia Phlegm, NP sent at 02/18/2018  2:40 PM EST ----- Bone density is normal, please notify patient ----- Message ----- From: Interface, Rad Results In Sent: 02/18/2018  11:39 AM EST To: Gardenia Phlegm, NP

## 2018-02-23 MED FILL — XARELTO 20 MG TABLET: 20 | 30 days supply | Qty: 30 | Fill #1

## 2018-03-05 MED FILL — ROSUVASTATIN CALCIUM 20 MG: 20 | 90 days supply | Qty: 90 | Fill #0 | Status: TO

## 2018-03-09 ENCOUNTER — Telehealth: Payer: Self-pay | Admitting: Hematology and Oncology

## 2018-03-09 ENCOUNTER — Inpatient Hospital Stay: Payer: 59 | Attending: Hematology and Oncology | Admitting: Hematology and Oncology

## 2018-03-09 DIAGNOSIS — C50212 Malignant neoplasm of upper-inner quadrant of left female breast: Secondary | ICD-10-CM | POA: Diagnosis present

## 2018-03-09 DIAGNOSIS — Z791 Long term (current) use of non-steroidal anti-inflammatories (NSAID): Secondary | ICD-10-CM | POA: Diagnosis not present

## 2018-03-09 DIAGNOSIS — Z7984 Long term (current) use of oral hypoglycemic drugs: Secondary | ICD-10-CM | POA: Insufficient documentation

## 2018-03-09 DIAGNOSIS — Z7901 Long term (current) use of anticoagulants: Secondary | ICD-10-CM | POA: Diagnosis not present

## 2018-03-09 DIAGNOSIS — Z79811 Long term (current) use of aromatase inhibitors: Secondary | ICD-10-CM | POA: Diagnosis not present

## 2018-03-09 DIAGNOSIS — Z79899 Other long term (current) drug therapy: Secondary | ICD-10-CM | POA: Diagnosis not present

## 2018-03-09 DIAGNOSIS — Z170 Estrogen receptor positive status [ER+]: Secondary | ICD-10-CM | POA: Diagnosis not present

## 2018-03-09 DIAGNOSIS — Z923 Personal history of irradiation: Secondary | ICD-10-CM | POA: Diagnosis not present

## 2018-03-09 DIAGNOSIS — Z7982 Long term (current) use of aspirin: Secondary | ICD-10-CM | POA: Diagnosis not present

## 2018-03-09 NOTE — Progress Notes (Signed)
Patient Care Team: Maury Dus, MD as PCP - General (Family Medicine) Kyung Rudd, MD as Consulting Physician (Radiation Oncology) Nicholas Lose, MD as Consulting Physician (Hematology and Oncology) Fanny Skates, MD as Consulting Physician (General Surgery) Delice Bison Charlestine Massed, NP as Nurse Practitioner (Hematology and Oncology)  DIAGNOSIS:  Encounter Diagnosis  Name Primary?  . Malignant neoplasm of upper-inner quadrant of left breast in female, estrogen receptor positive ([place])     SUMMARY OF ONCOLOGIC HISTORY:   Malignant neoplasm of upper-inner quadrant of left breast in female, estrogen receptor positive ([place])   02/24/2017 Initial Diagnosis    Screening detected left breast mass UIQ 1130 position: 1.9 cm, ultrasound biopsy: IDC grade 2, DCIS, ER 100%, PR 20%, Ki-67 25%, HER-2 negative ratio 1.43, T1 cN0 stage I a AJCC 8    03/17/2017 Genetic Testing    Negative genetic testing on the 9-gene STAT panel.  The STAT Breast cancer panel offered by [facility] includes sequencing and rearrangement analysis for the following 9 genes:  ATM, BRCA1, BRCA2, CDH1, CHEK2, PALB2, PTEN, STK11 and TP53.   The report date is March 17, 2017.  Testing was reflexed to larger common hereditary cancer panel.    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  The Hereditary Gene Panel offered by [facility] includes sequencing and/or deletion duplication testing of the following 47 genes: APC, ATM, AXIN2, BARD1, BMPR1A, BRCA1, BRCA2, BRIP1, CDH1, CDK4, CDKN2A (p14ARF), CDKN2A (p16INK4a), CHEK2, CTNNA1, DICER1, EPCAM (Deletion/duplication testing only), GREM1 (promoter region deletion/duplication testing only), KIT, MEN1, MLH1, MSH2, MSH3, MSH6, MUTYH, NBN, NF1, NHTL1, PALB2, PDGFRA, PMS2, POLD1, POLE, PTEN, RAD50, RAD51C, RAD51D, SDHB, SDHC, SDHD, SMAD4, SMARCA4. STK11, TP53, TSC1, TSC2, and VHL.  The following genes were evaluated for sequence changes only: SDHA and HOXB13 c.251G>A  variant only. The report date is March 19, 2017.     03/21/2017 Surgery    Left lumpectomy: IDC grade 2, 1.5 cm, DCIS intermediate grade, margins negative, intraductal papilloma and fibrocystic changes, 0/3 lymph nodes negative, ER 100%, PR 20%, HER-2 negative, Ki-67 25%, T1CN0 stage I a    04/10/2017 Oncotype testing    Oncotype DX recurrence score 20: 6% risk of recurrence with hormone therapy    05/12/2017 - 06/06/2017 Radiation Therapy    Adjuvant radiation therapy: Left breast treated to 42.5 Gy with 17 fx of 2.5 Gy followed by a boost of 7.5 Gy with 3 fx of 2.5 Gy    06/2017 -  Anti-estrogen oral therapy    Letrozole daily     CHIEF COMPLIANT: Follow-up on letrozole therapy  INTERVAL HISTORY: Melody Zhang is a 43-year with above-mentioned his left breast cancer treated with lumpectomy radiation is currently on letrozole therapy.  She is tolerating letrozole extremely well.  She does not have any hot flashes or myalgias.  Denies any lumps or nodules in the breast.  REVIEW OF SYSTEMS:   Constitutional: Denies fevers, chills or abnormal weight loss Eyes: Denies blurriness of vision Ears, nose, mouth, throat, and face: Denies mucositis or sore throat Respiratory: Denies cough, dyspnea or wheezes Cardiovascular: Denies palpitation, chest discomfort Gastrointestinal:  Denies nausea, heartburn or change in bowel habits Skin: Denies abnormal skin rashes Lymphatics: Denies new lymphadenopathy or easy bruising Neurological:Denies numbness, tingling or new weaknesses Behavioral/Psych: Mood is stable, no new changes  Extremities: No lower extremity edema Breast:  denies any pain or lumps or nodules in either breasts All other systems were reviewed with the patient and are negative.  I have reviewed  the past medical history, past surgical history, social history and family history with the patient and they are unchanged from previous note.  ALLERGIES:  is allergic to dobutamine and  lisinopril.  MEDICATIONS:  Current Outpatient Medications  Medication Sig Dispense Refill  . aspirin 81 MG EC tablet Take 81 mg by mouth every evening.    . calcium carbonate (TUMS - DOSED IN MG ELEMENTAL CALCIUM) 500 MG chewable tablet Chew 2 tablets by mouth 3 (three) times daily as needed for indigestion or heartburn.    . diclofenac sodium (VOLTAREN) 1 % GEL Apply 2-4 g topically 4 (four) times daily. 5 Tube 3  . diltiazem (CARDIZEM) 120 MG tablet Take 120 mg by mouth every evening.  0  . ferrous sulfate 325 (65 FE) MG tablet Take 325 mg by mouth every evening.    Marland Kitchen ibuprofen (ADVIL,MOTRIN) 200 MG tablet Take 800 mg by mouth every 8 (eight) hours as needed (for pain.).    Marland Kitchen KLOR-CON M20 20 MEQ tablet TAKE ONE TABLET BY MOUTH EVERY DAY (Patient taking differently: TAKE ONE TABLET BY MOUTH EVERY DAY IN THE EVENING.) 15 tablet 0  . letrozole (FEMARA) 2.5 MG tablet Take 1 tablet (2.5 mg total) by mouth daily. 90 tablet 3  . losartan-hydrochlorothiazide (HYZAAR) 100-25 MG tablet Take 1 tablet by mouth every evening.  1  . metFORMIN (GLUCOPHAGE-XR) 750 MG 24 hr tablet Take 750 mg by mouth every evening.  0  . nitroGLYCERIN (NITROSTAT) 0.4 MG SL tablet Place 1 tablet (0.4 mg total) under the tongue every 5 (five) minutes x 3 doses as needed for chest pain. 25 tablet 3  . Probiotic Product (ACIDOPHILUS/BIFIDUS PO) Take 1 capsule by mouth every evening.     . Rivaroxaban 15 & 20 MG TBPK Take as directed on package: Start with one 87m tablet by mouth twice a day with food. On Day 22, switch to one 274mtablet once a day 51 each 0  . rosuvastatin (CRESTOR) 20 MG tablet Take 20 mg by mouth every evening.    . vitamin B-12 (CYANOCOBALAMIN) 500 MCG tablet Take 1,000 mcg by mouth every evening.     . vitamin C (ASCORBIC ACID) 500 MG tablet Take 500 mg by mouth every evening.      No current facility-administered medications for this visit.     PHYSICAL EXAMINATION: ECOG PERFORMANCE STATUS: 1 -  Symptomatic but completely ambulatory  There were no vitals filed for this visit. There were no vitals filed for this visit.  GENERAL:alert, no distress and comfortable SKIN: skin color, texture, turgor are normal, no rashes or significant lesions EYES: normal, Conjunctiva are pink and non-injected, sclera clear OROPHARYNX:no exudate, no erythema and lips, buccal mucosa, and tongue normal  NECK: supple, thyroid normal size, non-tender, without nodularity LYMPH:  no palpable lymphadenopathy in the cervical, axillary or inguinal LUNGS: clear to auscultation and percussion with normal breathing effort HEART: regular rate & rhythm and no murmurs and no lower extremity edema ABDOMEN:abdomen soft, non-tender and normal bowel sounds MUSCULOSKELETAL:no cyanosis of digits and no clubbing  NEURO: alert & oriented x 3 with fluent speech, no focal motor/sensory deficits EXTREMITIES: No lower extremity edema BREAST: no palpable masses or nodules in either right or left breasts. No palpable axillary supraclavicular or infraclavicular adenopathy no breast tenderness or nipple discharge. (exam performed in the presence of a chaperone)  LABORATORY DATA:  I have reviewed the data as listed CMP Latest Ref Rng & Units 03/18/2017 03/05/2017 04/12/2011  Glucose  65 - 99 mg/dL 99 102 97  BUN 6 - 20 mg/dL _0 Creatinine 0.44 - 1.00 mg/dL 0.93 0.92 0.8  Sodium 135 - 145 mmol/L 140 140 139  Potassium 3.5 - 5.1 mmol/L 3.5 3.5 3.7  Chloride 101 - 111 mmol/L 102 102 102  CO2 22 - 32 mmol/L _1 Calcium 8.9 - 10.3 mg/dL 9.7 9.9 9.6  Total Protein 6.4 - 8.3 g/dL - 7.4 -  Total Bilirubin 0.2 - 1.2 mg/dL - 0.3 -  Alkaline Phos 40 - 150 U/L - 73 -  AST 5 - 34 U/L - 14 -  ALT 0 - 55 U/L - 13 -    Lab Results  Component Value Date   WBC 7.4 03/18/2017   HGB 12.0 03/18/2017   HCT 38.1 03/18/2017   MCV 81.8 03/18/2017   PLT 280 03/18/2017   NEUTROABS 7.0 (H) 03/05/2017    ASSESSMENT & PLAN:    Malignant neoplasm of upper-inner quadrant of left breast in female, estrogen receptor positive ([place]) 03/21/2017:Left lumpectomy: IDC grade 2, 1.5 cm, DCIS intermediate grade, margins negative, intraductal papilloma and fibrocystic changes, 0/3 lymph nodes negative, ER 100%, PR 20%, HER-2 negative, Ki-67 25%, T1CN0 stage I a Oncotype DX recurrence score 20: 6% risk of recurrence with hormone therapy  Adjuvant radiation therapy started 05/12/2017-06/06/2017 Current treatment: Letrozole 2.5 mg daily x5 to 7 years started 06/28/2017  Letrozole toxicities:  Breast cancer surveillance: 1.  Breast exam 09/04/2017: Benign 2.  Mammogram 02/18/2018: No evidence of malignancy postsurgical changes left breast breast density category B Patient is a pharmacist at [employer]  Return to clinic in 1 year for follow-up    No orders of the defined types were placed in this encounter.  The patient has a good understanding of the overall plan. she agrees with it. she will call with any problems that may develop before the next visit here.   Harriette Ohara, MD 03/09/18

## 2018-03-09 NOTE — Assessment & Plan Note (Signed)
03/21/2017:Left lumpectomy: IDC grade 2, 1.5 cm, DCIS intermediate grade, margins negative, intraductal papilloma and fibrocystic changes, 0/3 lymph nodes negative, ER 100%, PR 20%, HER-2 negative, Ki-67 25%, T1CN0 stage I a Oncotype DX recurrence score 20: 6% risk of recurrence with hormone therapy  Adjuvant radiation therapy started 05/12/2017-06/06/2017 Current treatment: Letrozole 2.5 mg daily x5 to 7 years started 06/28/2017  Letrozole toxicities:  Breast cancer surveillance: 1.  Breast exam 09/04/2017: Benign 2.  Mammogram 02/18/2018: No evidence of malignancy postsurgical changes left breast breast density category B Patient is a pharmacist at [employer]  Return to clinic in 1 year for follow-up

## 2018-03-09 NOTE — Telephone Encounter (Signed)
Gave avs and calendar ° °

## 2018-03-18 MED FILL — LETROZOLE 2.5 MG TABS: 2.5 | 90 days supply | Qty: 90 | Fill #3

## 2018-03-20 MED FILL — XARELTO 20 MG TABLET: 20 | 30 days supply | Qty: 30 | Fill #2

## 2018-04-03 MED FILL — POTASSIUM CHLORIDE CRYS ER: 20 | 90 days supply | Qty: 90 | Fill #0

## 2018-04-03 MED FILL — metFORMIN HCL ER 750 MG TB2: 750 | 90 days supply | Qty: 90 | Fill #0

## 2018-04-16 MED FILL — XARELTO 20 MG TABLET: 20 | 30 days supply | Qty: 30 | Fill #3 | Status: TO

## 2018-05-01 ENCOUNTER — Encounter: Payer: Self-pay | Admitting: Podiatry

## 2018-05-01 ENCOUNTER — Ambulatory Visit (INDEPENDENT_AMBULATORY_CARE_PROVIDER_SITE_OTHER): Payer: 59

## 2018-05-01 ENCOUNTER — Other Ambulatory Visit: Payer: Self-pay

## 2018-05-01 ENCOUNTER — Other Ambulatory Visit: Payer: Self-pay | Admitting: Podiatry

## 2018-05-01 ENCOUNTER — Ambulatory Visit: Payer: 59 | Admitting: Podiatry

## 2018-05-01 VITALS — BP 135/78 | HR 69 | Temp 97.9°F | Resp 16

## 2018-05-01 DIAGNOSIS — M25571 Pain in right ankle and joints of right foot: Secondary | ICD-10-CM

## 2018-05-01 DIAGNOSIS — M25572 Pain in left ankle and joints of left foot: Secondary | ICD-10-CM

## 2018-05-01 DIAGNOSIS — M779 Enthesopathy, unspecified: Secondary | ICD-10-CM

## 2018-05-01 DIAGNOSIS — M7752 Other enthesopathy of left foot: Secondary | ICD-10-CM | POA: Diagnosis not present

## 2018-05-01 DIAGNOSIS — M205X9 Other deformities of toe(s) (acquired), unspecified foot: Secondary | ICD-10-CM | POA: Diagnosis not present

## 2018-05-01 DIAGNOSIS — M7751 Other enthesopathy of right foot: Secondary | ICD-10-CM

## 2018-05-01 MED ORDER — TRIAMCINOLONE ACETONIDE 10 MG/ML IJ SUSP
10.0000 mg | Freq: Once | INTRAMUSCULAR | Status: AC
  Administered 2018-05-01: 10 mg

## 2018-05-01 NOTE — Progress Notes (Signed)
Subjective:   Patient ID: Melody Zhang, female   DOB: 63 y.o.   MRN: [id number]   HPI Patient presents stating she is developed a lot of pain in her ankles both feet right over left and states that her new job she is standing quite a bit more.  Also is having problems with her big toe joint which is been present for a long time and becoming more aggravating and states her feet just hurt in general.  Patient does not smoke and likes to be active   Review of Systems  All other systems reviewed and are negative.       Objective:  Physical Exam Vitals signs and nursing note reviewed.  Constitutional:      Appearance: She is well-developed.  Pulmonary:     Effort: Pulmonary effort is normal.  Musculoskeletal: Normal range of motion.  Skin:    General: Skin is warm.  Neurological:     Mental Status: She is alert.     Neurovascular status found to be intact muscle strength adequate reduced range of motion noted subtalar joint right over left.  Patient has exquisite discomfort in the sinus tarsi bilateral with also pain in the lateral ankle gutter and is found to have significant range of motion loss first MPJ bilateral with the right being worse with spur formation dorsally.  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  I then dispense fascial brace bilateral to support the arch and take stress off of it discussed the hallux limitus and the possibility long-term for addressing this with probable shortening osteotomy with plantar flexion component and patient will be seen back to reevaluate  X-rays indicate that there is indication  subtalar joint arthritis flatfoot deformity elevated first metatarsals bilateral with bone spur formation dorsal first metatarsal right over left

## 2018-05-01 NOTE — Progress Notes (Signed)
   Subjective:    Patient ID: Melody Zhang, female    DOB: [birth year], 63 y.o.   MRN: [id number]  HPI    Review of Systems  All other systems reviewed and are negative.      Objective:   Physical Exam        Assessment & Plan:

## 2018-05-01 NOTE — Patient Instructions (Signed)
Hallux Rigidus  Hallux rigidus is a type of joint pain or joint disease (arthritis) that affects your big toe (hallux). This condition involves the joint that connects the base of your big toe to the main part of your foot (metatarsophalangeal joint). This condition can cause your big toe to become stiff, painful, and difficult to move. Symptoms may get worse with movement or in cold or damp weather. The condition also gets worse over time. What are the causes? This condition may be caused by having a foot that does not function the way that it should or has an abnormal shape (structural deformity). These foot problems can run in families (be hereditary). This condition can also be caused by:  Injury.  Overuse.  Certain inflammatory diseases, including gout and rheumatoid arthritis. What increases the risk? This condition is more likely to develop in people who:  Have a foot bone (metatarsal) that is longer or higher than normal.  Have a family history of hallux rigidus.  Have previously injured their big toe.  Have feet that do not have a curve (arch) on the inner side of the foot. This may be called flat feet or fallen arches.  Turn their ankles in when they walk (pronation).  Have rheumatoid arthritis or gout.  Have to stoop down often at work. What are the signs or symptoms? Symptoms of this condition include:  Big toe pain.  Stiffness and difficulty moving the big toe.  Swelling of the toe and surrounding area.  Bone spurs. These are bony growths that can form on the joint of the big toe.  A limp. How is this diagnosed? This condition is diagnosed based on a medical history and physical exam. This may include X-rays. How is this treated? Treatment for this condition includes:  Wearing roomy, comfortable shoes that have a large toe box.  Putting orthotic devices in your shoes.  Pain medicines.  Physical therapy.  Icing the injured area.  Alternate between  putting your foot in cold water then warm water. If your condition is severe, treatment may include:  Corticosteroid injections to relieve pain.  Surgery to remove bone spurs, fuse damaged bones together, or replace the entire joint. Follow these instructions at home:  Take over-the-counter and prescription medicines only as told by your health care provider.  Do not wear high heels or other restrictive footwear. Wear comfortable, supportive shoes that have a large toe box.  Wear orthotics as told by your health care provider, if this applies.  Put your feet in cold water for 30 seconds, then in warm water for 30 seconds. Alternate between the cold and warm water for 5 minutes. Do this several times a day or as told by your health care provider.  If directed, apply ice to the injured area. ? Put ice in a plastic bag. ? Place a towel between your skin and the bag. ? Leave the ice on for 20 minutes, 2-3 times per day.  Do foot exercises as instructed by your health care provider or a physical therapist.  Keep all follow-up visits as told by your health care provider. This is important. Contact a health care provider if:  You notice bone spurs or growths on or around your big toe.  Your pain does not get better or it gets worse.  You have pain while resting.  You have pain in other parts of your body, such as your back, hip, or knee.  You start to limp. This information is not  intended to replace advice given to you by your health care provider. Make sure you discuss any questions you have with your health care provider. Document Released: 01/14/2005 Document Revised: 06/22/2015 Document Reviewed: 09/21/2014 Elsevier Interactive Patient Education  2019 [employer].

## 2018-05-06 MED FILL — DICLOFENAC SODIUM 1 % GEL: 1 | 30 days supply | Qty: 500 | Fill #0

## 2018-05-06 MED FILL — dilTIAZem HCL 120 MG TABS: 120 | 90 days supply | Qty: 90 | Fill #0

## 2018-05-13 ENCOUNTER — Encounter: Payer: Self-pay | Admitting: Podiatry

## 2018-05-13 ENCOUNTER — Ambulatory Visit (INDEPENDENT_AMBULATORY_CARE_PROVIDER_SITE_OTHER): Payer: 59 | Admitting: Podiatry

## 2018-05-13 ENCOUNTER — Other Ambulatory Visit: Payer: Self-pay

## 2018-05-13 VITALS — Temp 96.1°F

## 2018-05-13 DIAGNOSIS — M7752 Other enthesopathy of left foot: Secondary | ICD-10-CM | POA: Diagnosis not present

## 2018-05-13 DIAGNOSIS — M25571 Pain in right ankle and joints of right foot: Secondary | ICD-10-CM | POA: Diagnosis not present

## 2018-05-13 DIAGNOSIS — M205X9 Other deformities of toe(s) (acquired), unspecified foot: Secondary | ICD-10-CM

## 2018-05-13 DIAGNOSIS — M7751 Other enthesopathy of right foot: Secondary | ICD-10-CM | POA: Diagnosis not present

## 2018-05-13 DIAGNOSIS — M779 Enthesopathy, unspecified: Secondary | ICD-10-CM

## 2018-05-13 MED ORDER — TRIAMCINOLONE ACETONIDE 10 MG/ML IJ SUSP
10.0000 mg | Freq: Once | INTRAMUSCULAR | Status: AC
  Administered 2018-05-13: 10 mg

## 2018-05-13 NOTE — Progress Notes (Signed)
Subjective:   Patient ID: Melody Zhang, female   DOB: 63 y.o.   MRN: [id number]   HPI Patient states ankles are feeling quite a bit better but the big toe joints are still very sore right over left.  States is worse when getting up in the morning and after periods of trying to be on the feet.  Patient states that she overall feels better than before but she still has to weight-bear on her feet and that it still difficult   ROS      Objective:  Physical Exam  Neurovascular status intact with moderate collapse of medial longitudinal arch bilateral with inflammation posterior tibial tendon right over left with inflammation fluid of the sinus tarsi that is reduced.  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  I did do sterile prep and I injected the first MPJ bilateral 3 mg Kenalog 5 mg Xylocaine and discussed long-term that this will most likely require correction.  I then discussed orthotics and I have recommended a functional type orthotic device with lifting medial longitudinal arch to try to take stress off posterior tibial tendon sinus tarsi with Morton's extension to try to help reduce stress against the first MPJ.  Patient is scheduled with ped orthotist to have these constructed

## 2018-05-19 ENCOUNTER — Other Ambulatory Visit: Payer: Self-pay

## 2018-05-19 ENCOUNTER — Ambulatory Visit (INDEPENDENT_AMBULATORY_CARE_PROVIDER_SITE_OTHER): Payer: 59 | Admitting: Orthotics

## 2018-05-19 DIAGNOSIS — M205X9 Other deformities of toe(s) (acquired), unspecified foot: Secondary | ICD-10-CM

## 2018-05-19 DIAGNOSIS — M7751 Other enthesopathy of right foot: Secondary | ICD-10-CM

## 2018-05-19 DIAGNOSIS — M7752 Other enthesopathy of left foot: Secondary | ICD-10-CM

## 2018-05-19 DIAGNOSIS — M779 Enthesopathy, unspecified: Secondary | ICD-10-CM

## 2018-05-19 MED FILL — XARELTO 20 MG TABLET: 20 | 30 days supply | Qty: 30 | Fill #0

## 2018-05-19 MED FILL — ROSUVASTATIN CALCIUM 20 MG: 20 | 90 days supply | Qty: 90 | Fill #0

## 2018-05-19 NOTE — Progress Notes (Signed)
Patient is here today to be evaluated and cast for CMFO.  Patient has hx of  hallux rigidus ), and needs a supportive orthoses that will plantarflex first ray in order to lower hinge pin of first MPJ and enhance windless effect.  Plan of deep heel cup, hug arch, andmortons extension.  Richy to fab.

## 2018-06-11 ENCOUNTER — Encounter: Payer: Self-pay | Admitting: Podiatry

## 2018-06-11 ENCOUNTER — Ambulatory Visit (INDEPENDENT_AMBULATORY_CARE_PROVIDER_SITE_OTHER): Payer: 59 | Admitting: Podiatry

## 2018-06-11 ENCOUNTER — Ambulatory Visit: Payer: 59 | Admitting: Orthotics

## 2018-06-11 ENCOUNTER — Other Ambulatory Visit: Payer: Self-pay

## 2018-06-11 VITALS — Temp 97.7°F

## 2018-06-11 DIAGNOSIS — M25571 Pain in right ankle and joints of right foot: Secondary | ICD-10-CM

## 2018-06-11 DIAGNOSIS — M25572 Pain in left ankle and joints of left foot: Secondary | ICD-10-CM

## 2018-06-11 DIAGNOSIS — M779 Enthesopathy, unspecified: Secondary | ICD-10-CM

## 2018-06-11 DIAGNOSIS — M205X9 Other deformities of toe(s) (acquired), unspecified foot: Secondary | ICD-10-CM

## 2018-06-11 DIAGNOSIS — M7752 Other enthesopathy of left foot: Secondary | ICD-10-CM

## 2018-06-11 MED ORDER — TRIAMCINOLONE ACETONIDE 10 MG/ML IJ SUSP
10.0000 mg | Freq: Once | INTRAMUSCULAR | Status: AC
  Administered 2018-06-11: 10 mg

## 2018-06-11 NOTE — Progress Notes (Signed)
Subjective:   Patient ID: Melody Zhang, female   DOB: 63 y.o.   MRN: [id number]   HPI Patient presents to pick up orthotics and states the right ankle has started to get very tender in the big toe joints seem to be somewhat improved   ROS      Objective:  Physical Exam  Neurovascular status intact with patient found to have no motion first MPJ right mild on the left with inflammation sinus tarsi right     Assessment:  Recurrent sinus tarsitis right with severe hallux limitus deformity right over left     Plan:  Orthotics dispensed to patient and at this time I did sterile prep and did do a sinus tarsi injection right 3 mg Kenalog 5 mg Xylocaine and advised on orthotic usage and the possibility for correction of the big toe joint right with possibility for CT scans to rule out subtalar joint arthritis if symptoms persist

## 2018-06-11 NOTE — Progress Notes (Signed)
Patient came in today to pick up custom made foot orthotics.  The goals were accomplished and the patient reported no dissatisfaction with said orthotics.  Patient was advised of breakin period and how to report any issues. 

## 2018-06-12 DIAGNOSIS — I82402 Acute embolism and thrombosis of unspecified deep veins of left lower extremity: Secondary | ICD-10-CM | POA: Diagnosis not present

## 2018-06-12 DIAGNOSIS — E119 Type 2 diabetes mellitus without complications: Secondary | ICD-10-CM | POA: Diagnosis not present

## 2018-06-12 DIAGNOSIS — Z6841 Body Mass Index (BMI) 40.0 and over, adult: Secondary | ICD-10-CM | POA: Diagnosis not present

## 2018-06-12 DIAGNOSIS — Z90710 Acquired absence of both cervix and uterus: Secondary | ICD-10-CM | POA: Diagnosis not present

## 2018-06-12 DIAGNOSIS — G4733 Obstructive sleep apnea (adult) (pediatric): Secondary | ICD-10-CM | POA: Diagnosis not present

## 2018-06-12 DIAGNOSIS — C50212 Malignant neoplasm of upper-inner quadrant of left female breast: Secondary | ICD-10-CM | POA: Diagnosis not present

## 2018-06-12 DIAGNOSIS — I10 Essential (primary) hypertension: Secondary | ICD-10-CM | POA: Diagnosis not present

## 2018-06-17 MED FILL — XARELTO 20 MG TABLET: 20 | 30 days supply | Qty: 30 | Fill #1

## 2018-06-18 ENCOUNTER — Other Ambulatory Visit: Payer: Self-pay | Admitting: Hematology and Oncology

## 2018-06-18 MED FILL — LETROZOLE 2.5 MG TABLET: 2.5 | 90 days supply | Qty: 90 | Fill #0

## 2018-07-06 DIAGNOSIS — G4733 Obstructive sleep apnea (adult) (pediatric): Secondary | ICD-10-CM | POA: Diagnosis not present

## 2018-07-13 MED FILL — LOSARTAN-HCTZ 100-25 MG TAB: 100-25 | 90 days supply | Qty: 45 | Fill #0

## 2018-07-13 MED FILL — metFORMIN HCL ER 750 MG TB2: 750 | 90 days supply | Qty: 90 | Fill #1

## 2018-07-24 MED FILL — XARELTO 20 MG TABLET: 20 | 90 days supply | Qty: 90 | Fill #0

## 2018-07-24 MED FILL — POTASSIUM CHLORIDE CRYS ER: 20 | 90 days supply | Qty: 90 | Fill #1

## 2018-07-30 ENCOUNTER — Ambulatory Visit: Payer: 59 | Admitting: Podiatry

## 2018-07-31 ENCOUNTER — Other Ambulatory Visit: Payer: Self-pay

## 2018-07-31 ENCOUNTER — Emergency Department (HOSPITAL_COMMUNITY)
Admission: EM | Admit: 2018-07-31 | Discharge: 2018-08-01 | Disposition: A | Discharge status: Home / Self Care | Payer: 59 | Attending: Emergency Medicine | Admitting: Emergency Medicine

## 2018-07-31 DIAGNOSIS — Y939 Activity, unspecified: Secondary | ICD-10-CM | POA: Insufficient documentation

## 2018-07-31 DIAGNOSIS — Y999 Unspecified external cause status: Secondary | ICD-10-CM | POA: Diagnosis not present

## 2018-07-31 DIAGNOSIS — S8012XA Contusion of left lower leg, initial encounter: Secondary | ICD-10-CM | POA: Insufficient documentation

## 2018-07-31 DIAGNOSIS — Z7901 Long term (current) use of anticoagulants: Secondary | ICD-10-CM | POA: Diagnosis not present

## 2018-07-31 DIAGNOSIS — W108XXA Fall (on) (from) other stairs and steps, initial encounter: Secondary | ICD-10-CM

## 2018-07-31 DIAGNOSIS — Y929 Unspecified place or not applicable: Secondary | ICD-10-CM | POA: Insufficient documentation

## 2018-07-31 DIAGNOSIS — Z7984 Long term (current) use of oral hypoglycemic drugs: Secondary | ICD-10-CM | POA: Insufficient documentation

## 2018-07-31 DIAGNOSIS — Z853 Personal history of malignant neoplasm of breast: Secondary | ICD-10-CM | POA: Diagnosis not present

## 2018-07-31 DIAGNOSIS — E119 Type 2 diabetes mellitus without complications: Secondary | ICD-10-CM | POA: Insufficient documentation

## 2018-07-31 DIAGNOSIS — Z79899 Other long term (current) drug therapy: Secondary | ICD-10-CM | POA: Insufficient documentation

## 2018-07-31 DIAGNOSIS — S99922A Unspecified injury of left foot, initial encounter: Secondary | ICD-10-CM | POA: Diagnosis present

## 2018-07-31 DIAGNOSIS — Z7982 Long term (current) use of aspirin: Secondary | ICD-10-CM | POA: Insufficient documentation

## 2018-07-31 DIAGNOSIS — R2242 Localized swelling, mass and lump, left lower limb: Secondary | ICD-10-CM | POA: Diagnosis not present

## 2018-07-31 DIAGNOSIS — I10 Essential (primary) hypertension: Secondary | ICD-10-CM | POA: Insufficient documentation

## 2018-07-31 DIAGNOSIS — M79672 Pain in left foot: Secondary | ICD-10-CM | POA: Diagnosis not present

## 2018-07-31 DIAGNOSIS — W109XXA Fall (on) (from) unspecified stairs and steps, initial encounter: Secondary | ICD-10-CM | POA: Insufficient documentation

## 2018-07-31 DIAGNOSIS — M79662 Pain in left lower leg: Secondary | ICD-10-CM | POA: Diagnosis not present

## 2018-07-31 DIAGNOSIS — M25572 Pain in left ankle and joints of left foot: Secondary | ICD-10-CM | POA: Diagnosis not present

## 2018-07-31 DIAGNOSIS — S90112A Contusion of left great toe without damage to nail, initial encounter: Secondary | ICD-10-CM | POA: Insufficient documentation

## 2018-07-31 NOTE — ED Triage Notes (Addendum)
Pt missed a step and injured L ankle and foot; bruising and abrasion also noted to L thigh. Denies hitting or head or LOC

## 2018-08-01 ENCOUNTER — Emergency Department (HOSPITAL_COMMUNITY): Payer: 59

## 2018-08-01 DIAGNOSIS — Z7901 Long term (current) use of anticoagulants: Secondary | ICD-10-CM | POA: Diagnosis not present

## 2018-08-01 DIAGNOSIS — M79662 Pain in left lower leg: Secondary | ICD-10-CM | POA: Diagnosis not present

## 2018-08-01 DIAGNOSIS — E119 Type 2 diabetes mellitus without complications: Secondary | ICD-10-CM | POA: Diagnosis not present

## 2018-08-01 DIAGNOSIS — Z7984 Long term (current) use of oral hypoglycemic drugs: Secondary | ICD-10-CM | POA: Diagnosis not present

## 2018-08-01 DIAGNOSIS — Z853 Personal history of malignant neoplasm of breast: Secondary | ICD-10-CM | POA: Diagnosis not present

## 2018-08-01 DIAGNOSIS — I10 Essential (primary) hypertension: Secondary | ICD-10-CM | POA: Diagnosis not present

## 2018-08-01 DIAGNOSIS — S8012XA Contusion of left lower leg, initial encounter: Secondary | ICD-10-CM | POA: Diagnosis not present

## 2018-08-01 DIAGNOSIS — Z79899 Other long term (current) drug therapy: Secondary | ICD-10-CM | POA: Diagnosis not present

## 2018-08-01 DIAGNOSIS — M25572 Pain in left ankle and joints of left foot: Secondary | ICD-10-CM | POA: Diagnosis not present

## 2018-08-01 DIAGNOSIS — M79672 Pain in left foot: Secondary | ICD-10-CM | POA: Diagnosis not present

## 2018-08-01 DIAGNOSIS — Z7982 Long term (current) use of aspirin: Secondary | ICD-10-CM | POA: Diagnosis not present

## 2018-08-01 DIAGNOSIS — S90112A Contusion of left great toe without damage to nail, initial encounter: Secondary | ICD-10-CM | POA: Diagnosis not present

## 2018-08-01 DIAGNOSIS — R2242 Localized swelling, mass and lump, left lower limb: Secondary | ICD-10-CM | POA: Diagnosis not present

## 2018-08-01 NOTE — ED Provider Notes (Signed)
[facility] EMERGENCY DEPARTMENT Provider Note   CSN: [id number] Arrival date & time: 07/31/18  2253    History   Chief Complaint Chief Complaint  Patient presents with   Fall    HPI Melody Zhang is a 62 y.o. female.   The history is provided by the patient.  Fall  She has history of hypertension, diabetes, hyperlipidemia, breast cancer, DVT anticoagulated on rivaroxaban and comes in following a fall.  She was going down some steps when she missed a step and fell injuring her left leg.  She denies any head injury or loss of consciousness.  She is complaining of pain in her left lower leg and foot with the worst pain being in her left great toe.  She rates pain at 8/10.  Past Medical History:  Diagnosis Date   Cancer [facility])    breast cancer   Diabetes mellitus    Family history of adverse reaction to anesthesia    Brother's heart stopped during surgery on urethra at [facility]   Family history of breast cancer    Family history of pancreatic cancer    GERD (gastroesophageal reflux disease)    not on medication for this, TUMS PRN   Heart murmur    Hyperlipemia    Hypertension    Intermittent palpitations    once a month   Paroxysmal SVT (supraventricular tachycardia) ([place])    Myoview was performed 03/26/11: Low risk with small partially reversible apical perfusion defect likely breast attenuation, no ischemia, no scar, EF 62%.    Personal history of radiation therapy 2019   Sleep apnea    CPAP    Patient Active Problem List   Diagnosis Date Noted   Genetic testing 03/18/2017   Family history of breast cancer    Family history of pancreatic cancer    Malignant neoplasm of upper-inner quadrant of left breast in female, estrogen receptor positive ([place]) 03/04/2017   SVT (supraventricular tachycardia) ([place]) 03/18/2011   Hypokalemia 03/18/2011   Diabetes mellitus 03/18/2011   HYPERLIPIDEMIA 01/30/2007   HYPERTENSION 01/30/2007    ALLERGIC RHINITIS 01/30/2007    Past Surgical History:  Procedure Laterality Date   ABDOMINAL HYSTERECTOMY     BREAST BIOPSY Left 02/24/2017   BREAST LUMPECTOMY Left 03/21/2017   BREAST LUMPECTOMY WITH RADIOACTIVE SEED AND SENTINEL LYMPH NODE BIOPSY Left 03/21/2017   Procedure: LEFT BREAST LUMPECTOMY WITH RADIOACTIVE SEED AND LEFT AXILLARY DEEP LYMPH NODE BIOPSY INJECTION BLUE DYE LEFT BREAST ERAS PATHWAY;  Surgeon: Fanny Skates, MD;  Location: [place];  Service: General;  Laterality: Left;   HERNIA REPAIR     umbilical hernia     OB History   No obstetric history on file.      Home Medications    Prior to Admission medications   Medication Sig Start Date End Date Taking? Authorizing Provider  aspirin 81 MG EC tablet Take 81 mg by mouth every evening.    [provider]  calcium carbonate (TUMS - DOSED IN MG ELEMENTAL CALCIUM) 500 MG chewable tablet Chew 2 tablets by mouth 3 (three) times daily as needed for indigestion or heartburn.    [provider]  diclofenac sodium (VOLTAREN) 1 % GEL Apply 2-4 g topically 4 (four) times daily. 11/05/17   Cherylann Ratel, PA-C  diltiazem (CARDIZEM) 120 MG tablet Take 120 mg by mouth every evening. 12/16/16   [provider]  ferrous sulfate 325 (65 FE) MG tablet Take 325 mg by mouth every evening.  [provider]  ibuprofen (ADVIL,MOTRIN) 200 MG tablet Take 800 mg by mouth every 8 (eight) hours as needed (for pain.).    [provider]  KLOR-CON M20 20 MEQ tablet TAKE ONE TABLET BY MOUTH EVERY DAY Patient taking differently: TAKE ONE TABLET BY MOUTH EVERY DAY IN THE EVENING. 03/05/13   Allred, Jeneen Rinks, MD  letrozole (FEMARA) 2.5 MG tablet TAKE 1 TABLET BY MOUTH DAILY. 06/18/18   Nicholas Lose, MD  losartan-hydrochlorothiazide (HYZAAR) 100-25 MG tablet Take 1 tablet by mouth every evening. 02/26/17   [provider]  metFORMIN (GLUCOPHAGE-XR) 750 MG 24 hr tablet Take 750 mg by mouth every  evening. 12/16/16   [provider]  nitroGLYCERIN (NITROSTAT) 0.4 MG SL tablet Place 1 tablet (0.4 mg total) under the tongue every 5 (five) minutes x 3 doses as needed for chest pain. 03/18/11 03/15/18  Theora Gianotti, NP  Probiotic Product (ACIDOPHILUS/BIFIDUS PO) Take 1 capsule by mouth every evening.     [provider]  Rivaroxaban 15 & 20 MG TBPK Take as directed on package: Start with one 19m tablet by mouth twice a day with food. On Day 22, switch to one 273mtablet once a day 12/23/17   NeRaylene EvertsMD  rosuvastatin (CRESTOR) 20 MG tablet Take 20 mg by mouth every evening.    [provider]  vitamin B-12 (CYANOCOBALAMIN) 500 MCG tablet Take 1,000 mcg by mouth every evening.     [provider]  vitamin C (ASCORBIC ACID) 500 MG tablet Take 500 mg by mouth every evening.     [provider]  XARELTO 20 MG TABS tablet  04/16/18   [provider]    Family History Family History  Problem Relation Age of Onset   Healthy Mother        There is no family history of arrythmia   Multiple myeloma Mother [id number] Heart attack Father [id number]     There is no family history of arrythmia   Lymphoma Maternal Aunt    Liver cancer Maternal Uncle    Pancreatic cancer Maternal Grandmother [id number] Pancreatic cancer Cousin        paternal first cousin    Social History Social History   Tobacco Use   Smoking status: Never Smoker   Smokeless tobacco: Never Used  Substance Use Topics   Alcohol use: No   Drug use: No     Allergies   Dobutamine and Lisinopril   Review of Systems Review of Systems  All other systems reviewed and are negative.    Physical Exam Updated Vital Signs BP (!) 179/94 (BP Location: Right Arm)    Pulse 79    Temp 98.5 F (36.9 C) (Oral)    Resp 18    SpO2 98%   Physical Exam Vitals signs and nursing note reviewed.    [age]ear old female, resting comfortably and in no acute distress.  Vital signs are significant for elevated blood pressure. Oxygen saturation is 98%, which is normal. Head is normocephalic and atraumatic. PERRLA, EOMI. Oropharynx is clear. Neck is nontender and supple without adenopathy or JVD. Back is nontender and there is no CVA tenderness. Lungs are clear without rales, wheezes, or rhonchi. Chest is nontender. Heart has regular rate and rhythm without murmur. Abdomen is soft, flat, nontender without masses or hepatosplenomegaly and peristalsis is normoactive. Extremities: No significant swelling of the left leg.  Left knee has no instability on valgus or  varus stress and no effusion present.  There is mild to moderate tenderness of the anterior aspect of the left lower leg approximately 5 cm distal to the tibial tubercle.  There is moderate tenderness to palpation over the left first MTP joint.  No other tenderness elicited.  Neurovascular exam is intact. Skin is warm and dry without rash. Neurologic: Mental status is normal, cranial nerves are intact, there are no motor or sensory deficits.  ED Treatments / Results   Radiology Dg Tibia/fibula Left  Result Date: 08/01/2018 CLINICAL DATA:  63 y/o F; fall 2 hours ago. Mid anterior tibia fibula and ankle pain. Medial left foot pain and swelling. EXAM: LEFT TIBIA AND FIBULA - 2 VIEW; LEFT ANKLE COMPLETE - 3+ VIEW; LEFT FOOT - COMPLETE 3+ VIEW COMPARISON:  05/01/2018 left ankle radiographs. FINDINGS: Left tibia and fibula: There is no evidence of fracture or other focal bone lesions. Soft tissues are unremarkable. Osteoarthrosis of the knee with mild stenosis of medial femorotibial and patellofemoral compartments and periarticular osteophytosis. Left ankle: There is no evidence of fracture or other focal bone lesions. Soft tissues are unremarkable. Small dorsal and plantar calcaneal enthesophytes. Left foot: There is no evidence of fracture or other focal bone lesions. Osteoarthrosis of the first metatarsophalangeal  joint with periarticular osteophytes and fibrocystic degeneration. IMPRESSION: No acute fracture or dislocation identified. Electronically Signed   By: Kristine Garbe M.D.   On: 08/01/2018 00:51   Dg Ankle Complete Left  Result Date: 08/01/2018 CLINICAL DATA:  63 y/o F; fall 2 hours ago. Mid anterior tibia fibula and ankle pain. Medial left foot pain and swelling. EXAM: LEFT TIBIA AND FIBULA - 2 VIEW; LEFT ANKLE COMPLETE - 3+ VIEW; LEFT FOOT - COMPLETE 3+ VIEW COMPARISON:  05/01/2018 left ankle radiographs. FINDINGS: Left tibia and fibula: There is no evidence of fracture or other focal bone lesions. Soft tissues are unremarkable. Osteoarthrosis of the knee with mild stenosis of medial femorotibial and patellofemoral compartments and periarticular osteophytosis. Left ankle: There is no evidence of fracture or other focal bone lesions. Soft tissues are unremarkable. Small dorsal and plantar calcaneal enthesophytes. Left foot: There is no evidence of fracture or other focal bone lesions. Osteoarthrosis of the first metatarsophalangeal joint with periarticular osteophytes and fibrocystic degeneration. IMPRESSION: No acute fracture or dislocation identified. Electronically Signed   By: Kristine Garbe M.D.   On: 08/01/2018 00:51   Dg Foot Complete Left  Result Date: 08/01/2018 CLINICAL DATA:  63 y/o F; fall 2 hours ago. Mid anterior tibia fibula and ankle pain. Medial left foot pain and swelling. EXAM: LEFT TIBIA AND FIBULA - 2 VIEW; LEFT ANKLE COMPLETE - 3+ VIEW; LEFT FOOT - COMPLETE 3+ VIEW COMPARISON:  05/01/2018 left ankle radiographs. FINDINGS: Left tibia and fibula: There is no evidence of fracture or other focal bone lesions. Soft tissues are unremarkable. Osteoarthrosis of the knee with mild stenosis of medial femorotibial and patellofemoral compartments and periarticular osteophytosis. Left ankle: There is no evidence of fracture or other focal bone lesions. Soft tissues are  unremarkable. Small dorsal and plantar calcaneal enthesophytes. Left foot: There is no evidence of fracture or other focal bone lesions. Osteoarthrosis of the first metatarsophalangeal joint with periarticular osteophytes and fibrocystic degeneration. IMPRESSION: No acute fracture or dislocation identified. Electronically Signed   By: Kristine Garbe M.D.   On: 08/01/2018 00:51    Procedures Procedures   Medications Ordered in ED Medications - No data to display   Initial Impression / Assessment and Plan /  ED Course  I have reviewed the triage vital signs and the nursing notes.  Pertinent imaging results that were available during my care of the patient were reviewed by me and considered in my medical decision making (see chart for details).  Fall down steps with injury to left leg.  X-rays showed no evidence of fracture.  Patient is given a postop shoe to use as needed and given crutches to use as needed.  Cannot use NSAIDs because of anticoagulated state.  Recommended to use ice and elevation and over-the-counter acetaminophen.  She is followed by [facility] and is advised to follow-up with them as needed.  Final Clinical Impressions(s) / ED Diagnoses   Final diagnoses:  Fall down steps, initial encounter  Contusion of left great toe without damage to nail, initial encounter  Contusion of left lower leg, initial encounter  Elevated blood pressure reading with diagnosis of hypertension  Chronic anticoagulation    ED Discharge Orders    None       Delora Fuel, MD 44/51/46 [id number]

## 2018-08-01 NOTE — ED Notes (Signed)
Pt. refused assistance with bedside commode

## 2018-08-01 NOTE — Discharge Instructions (Addendum)
Apply ice for thirty minutes at a time, four times a day.  Keep your foot elevated as much as possible.  Use crutches, post-op show as needed.  Take acetaminophen as needed for pain.

## 2018-08-04 DIAGNOSIS — Z170 Estrogen receptor positive status [ER+]: Secondary | ICD-10-CM | POA: Diagnosis not present

## 2018-08-04 DIAGNOSIS — C50912 Malignant neoplasm of unspecified site of left female breast: Secondary | ICD-10-CM | POA: Diagnosis not present

## 2018-08-04 DIAGNOSIS — Z6841 Body Mass Index (BMI) 40.0 and over, adult: Secondary | ICD-10-CM | POA: Diagnosis not present

## 2018-08-04 DIAGNOSIS — I10 Essential (primary) hypertension: Secondary | ICD-10-CM | POA: Diagnosis not present

## 2018-08-04 DIAGNOSIS — E119 Type 2 diabetes mellitus without complications: Secondary | ICD-10-CM | POA: Diagnosis not present

## 2018-08-04 DIAGNOSIS — G4733 Obstructive sleep apnea (adult) (pediatric): Secondary | ICD-10-CM | POA: Diagnosis not present

## 2018-08-04 DIAGNOSIS — E7800 Pure hypercholesterolemia, unspecified: Secondary | ICD-10-CM | POA: Diagnosis not present

## 2018-08-04 DIAGNOSIS — I471 Supraventricular tachycardia: Secondary | ICD-10-CM | POA: Diagnosis not present

## 2018-08-04 MED FILL — IBUPROFEN 800 MG TABS: 800 | 30 days supply | Qty: 90 | Fill #0

## 2018-08-07 MED FILL — FREESTYLE LANCETS: 50 days supply | Qty: 100 | Fill #0

## 2018-08-10 MED FILL — FREESTYLE LITE METER: 25 days supply | Qty: 1 | Fill #0

## 2018-08-10 MED FILL — FREESTYLE LITE TEST STRIP: 50 days supply | Qty: 100 | Fill #0

## 2018-08-28 ENCOUNTER — Ambulatory Visit (INDEPENDENT_AMBULATORY_CARE_PROVIDER_SITE_OTHER): Payer: 59 | Admitting: Podiatry

## 2018-08-28 ENCOUNTER — Ambulatory Visit (INDEPENDENT_AMBULATORY_CARE_PROVIDER_SITE_OTHER): Payer: 59

## 2018-08-28 ENCOUNTER — Encounter: Payer: Self-pay | Admitting: Podiatry

## 2018-08-28 ENCOUNTER — Other Ambulatory Visit: Payer: Self-pay

## 2018-08-28 VITALS — Temp 97.6°F

## 2018-08-28 DIAGNOSIS — E119 Type 2 diabetes mellitus without complications: Secondary | ICD-10-CM | POA: Diagnosis not present

## 2018-08-28 DIAGNOSIS — D649 Anemia, unspecified: Secondary | ICD-10-CM | POA: Diagnosis not present

## 2018-08-28 DIAGNOSIS — I10 Essential (primary) hypertension: Secondary | ICD-10-CM | POA: Diagnosis not present

## 2018-08-28 DIAGNOSIS — S9032XA Contusion of left foot, initial encounter: Secondary | ICD-10-CM

## 2018-08-28 DIAGNOSIS — M779 Enthesopathy, unspecified: Secondary | ICD-10-CM

## 2018-08-28 NOTE — Progress Notes (Signed)
Subjective:   Patient ID: Melody Zhang, female   DOB: 63 y.o.   MRN: [id number]   HPI Patient states she traumatized her left foot and has developed a knot on top of the foot and also the big toe joint has been sore along with the inner phalangeal joint left.  States she traumatized it about 4 weeks ago    ROS      Objective:  Physical Exam neurovascular status was found to be intact with a palpable nodule in the distal metatarsal shaft second left that is localized does not appear to be connected to bone.  Also mild discomfort in the inner phalangeal joint left big toe and pain of the first MPJ secondary to motion loss     Assessment:  Probability for inflammatory tendon or contusion of the left foot with the possibility that this may be a fibrous nodule which may require excision left along with inflammatory capsulitis of the first MPJ     Plan:  H&P x-ray reviewed and today I did do a steroid injection of the left dorsal tendon complex near the MPJ second with 3 mg Dexasone Kenalog 5 g Xylocaine advised on warm and cool soaks.  Patient will be seen back and understands she may require excision  X-ray indicates that there is no signs of calcification or bone injury with arthritis occurring of the first MPJ left with spurring narrowing of the joint surface

## 2018-09-01 MED FILL — dilTIAZem HCL 120 MG TABS: 120 | 90 days supply | Qty: 90 | Fill #0

## 2018-09-15 MED FILL — ROSUVASTATIN CALCIUM 20 MG: 20 | 90 days supply | Qty: 90 | Fill #0

## 2018-09-16 MED FILL — LETROZOLE 2.5 MG TABS: 2.5 | 90 days supply | Qty: 90 | Fill #0

## 2018-09-23 ENCOUNTER — Ambulatory Visit: Payer: 59 | Admitting: Podiatry

## 2018-09-30 ENCOUNTER — Encounter: Payer: Self-pay | Admitting: Podiatry

## 2018-09-30 ENCOUNTER — Other Ambulatory Visit: Payer: Self-pay

## 2018-09-30 ENCOUNTER — Ambulatory Visit (INDEPENDENT_AMBULATORY_CARE_PROVIDER_SITE_OTHER): Payer: 59 | Admitting: Podiatry

## 2018-09-30 DIAGNOSIS — M779 Enthesopathy, unspecified: Secondary | ICD-10-CM

## 2018-09-30 DIAGNOSIS — M25571 Pain in right ankle and joints of right foot: Secondary | ICD-10-CM | POA: Diagnosis not present

## 2018-09-30 DIAGNOSIS — M205X9 Other deformities of toe(s) (acquired), unspecified foot: Secondary | ICD-10-CM

## 2018-09-30 NOTE — Progress Notes (Signed)
Subjective:   Patient ID: Melody Zhang, female   DOB: 63 y.o.   MRN: [id number]   HPI Patient states her feet are feeling reasonably good and the pain in her big toe joint is well present is not as bad but knows that she has poor motion and also states the dorsum of the left foot is no longer tender   ROS      Objective:  Physical Exam  Neurovascular status intact with patient's first MPJ bilateral found to be reduced as far as inflammation goes with pain still noted upon deep palpation and severe range of motion loss with improved tendinitis left upon palpation     Assessment:  Has significant hallux limitus rigidus condition bilateral with inflammatory joints and also tendinitis left that improved     Plan:  H&P discussed both conditions and we can see back 2 months and if the first MPJ stay stable with orthotics and previous injection we will continue injection treatment with the consideration at one point that this may require either fusion joint implantation or osteotomy surgery.  I did educate her on all of this today

## 2018-10-19 MED FILL — LOSARTAN-HCTZ 100-25 MG TAB: 100-25 | 90 days supply | Qty: 45 | Fill #1

## 2018-10-19 MED FILL — POTASSIUM CHLORIDE CRYS ER: 20 | 90 days supply | Qty: 90 | Fill #0

## 2018-10-19 MED FILL — IBUPROFEN 800 MG TABS: 800 | 30 days supply | Qty: 90 | Fill #1

## 2018-10-26 MED FILL — metFORMIN HCL ER 500 MG TB2: 500 | 90 days supply | Qty: 180 | Fill #0

## 2018-11-04 MED FILL — metFORMIN HCL ER 500 MG TB2: 500 | 90 days supply | Qty: 180 | Fill #0

## 2018-11-25 ENCOUNTER — Ambulatory Visit: Payer: 59 | Admitting: Podiatry

## 2018-11-26 DIAGNOSIS — G4733 Obstructive sleep apnea (adult) (pediatric): Secondary | ICD-10-CM | POA: Diagnosis not present

## 2018-12-17 ENCOUNTER — Encounter: Payer: Self-pay | Admitting: Podiatry

## 2018-12-17 ENCOUNTER — Ambulatory Visit (INDEPENDENT_AMBULATORY_CARE_PROVIDER_SITE_OTHER): Payer: 59 | Admitting: Podiatry

## 2018-12-17 ENCOUNTER — Other Ambulatory Visit: Payer: Self-pay

## 2018-12-17 DIAGNOSIS — M779 Enthesopathy, unspecified: Secondary | ICD-10-CM

## 2018-12-17 DIAGNOSIS — M205X9 Other deformities of toe(s) (acquired), unspecified foot: Secondary | ICD-10-CM | POA: Diagnosis not present

## 2018-12-17 MED FILL — dilTIAZem HCL 120 MG TABS: 120 | 90 days supply | Qty: 90 | Fill #0

## 2018-12-17 MED FILL — ROSUVASTATIN CALCIUM 20 MG: 20 | 90 days supply | Qty: 90 | Fill #1

## 2018-12-17 MED FILL — IBUPROFEN 800 MG TABS: 800 | 30 days supply | Qty: 90 | Fill #2

## 2018-12-17 NOTE — Progress Notes (Signed)
Subjective:   Patient ID: Melody Zhang, female   DOB: 64 y.o.   MRN: [id number]   HPI Patient states the big toe joint seems to be doing okay but the ankle has really been sore on the right and that seems at this point to be more my issue   ROS      Objective:  Physical Exam  Neurovascular status intact with inflammation pain around the sinus tarsi right with relative flatfoot deformity and significant hallux limitus deformity right over left     Assessment:  2 separate conditions 1 being sinus tarsitis with the other being inflammatory capsulitis of the first MPJ with hallux limitus bone spur formation     Plan:  H&P reviewed condition did sterile prep and injected the sinus tarsi right 3 mg Kenalog 5 mg Xylocaine discussed the big toe joint and discussed the possibility for osteotomy surgery and I want to see her back in a month and see her response to medication and considerations for treatment

## 2018-12-28 MED FILL — LETROZOLE 2.5 MG TABS: 2.5 | 90 days supply | Qty: 90 | Fill #1

## 2018-12-30 ENCOUNTER — Emergency Department (HOSPITAL_COMMUNITY): Payer: 59

## 2018-12-30 ENCOUNTER — Emergency Department (HOSPITAL_COMMUNITY)
Admission: EM | Admit: 2018-12-30 | Discharge: 2018-12-30 | Disposition: A | Discharge status: Home / Self Care | Payer: 59 | Attending: Emergency Medicine | Admitting: Emergency Medicine

## 2018-12-30 ENCOUNTER — Other Ambulatory Visit: Payer: Self-pay

## 2018-12-30 ENCOUNTER — Encounter (HOSPITAL_COMMUNITY): Payer: Self-pay | Admitting: Obstetrics and Gynecology

## 2018-12-30 DIAGNOSIS — S199XXA Unspecified injury of neck, initial encounter: Secondary | ICD-10-CM | POA: Diagnosis not present

## 2018-12-30 DIAGNOSIS — E119 Type 2 diabetes mellitus without complications: Secondary | ICD-10-CM | POA: Insufficient documentation

## 2018-12-30 DIAGNOSIS — R519 Headache, unspecified: Secondary | ICD-10-CM | POA: Insufficient documentation

## 2018-12-30 DIAGNOSIS — S299XXA Unspecified injury of thorax, initial encounter: Secondary | ICD-10-CM | POA: Diagnosis not present

## 2018-12-30 DIAGNOSIS — I10 Essential (primary) hypertension: Secondary | ICD-10-CM | POA: Diagnosis not present

## 2018-12-30 DIAGNOSIS — R52 Pain, unspecified: Secondary | ICD-10-CM | POA: Diagnosis not present

## 2018-12-30 DIAGNOSIS — M546 Pain in thoracic spine: Secondary | ICD-10-CM | POA: Diagnosis not present

## 2018-12-30 DIAGNOSIS — Z79899 Other long term (current) drug therapy: Secondary | ICD-10-CM | POA: Diagnosis not present

## 2018-12-30 DIAGNOSIS — M542 Cervicalgia: Secondary | ICD-10-CM | POA: Diagnosis not present

## 2018-12-30 DIAGNOSIS — S161XXA Strain of muscle, fascia and tendon at neck level, initial encounter: Secondary | ICD-10-CM | POA: Diagnosis not present

## 2018-12-30 DIAGNOSIS — Y93I9 Activity, other involving external motion: Secondary | ICD-10-CM | POA: Insufficient documentation

## 2018-12-30 DIAGNOSIS — S0990XA Unspecified injury of head, initial encounter: Secondary | ICD-10-CM | POA: Diagnosis not present

## 2018-12-30 DIAGNOSIS — R0789 Other chest pain: Secondary | ICD-10-CM | POA: Insufficient documentation

## 2018-12-30 DIAGNOSIS — M25539 Pain in unspecified wrist: Secondary | ICD-10-CM | POA: Diagnosis not present

## 2018-12-30 DIAGNOSIS — Y92410 Unspecified street and highway as the place of occurrence of the external cause: Secondary | ICD-10-CM | POA: Insufficient documentation

## 2018-12-30 DIAGNOSIS — Y999 Unspecified external cause status: Secondary | ICD-10-CM | POA: Insufficient documentation

## 2018-12-30 DIAGNOSIS — M7918 Myalgia, other site: Secondary | ICD-10-CM

## 2018-12-30 DIAGNOSIS — M25532 Pain in left wrist: Secondary | ICD-10-CM | POA: Diagnosis not present

## 2018-12-30 DIAGNOSIS — S6992XA Unspecified injury of left wrist, hand and finger(s), initial encounter: Secondary | ICD-10-CM | POA: Diagnosis not present

## 2018-12-30 MED ORDER — ACETAMINOPHEN 500 MG PO TABS
1000.0000 mg | ORAL_TABLET | Freq: Once | ORAL | Status: AC
  Administered 2018-12-30: 1000 mg via ORAL
  Filled 2018-12-30: qty 2

## 2018-12-30 MED ORDER — IBUPROFEN 800 MG PO TABS: 800.0000 mg | ORAL_TABLET | Freq: Three times a day (TID) | ORAL | 0 refills | Status: DC | PRN

## 2018-12-30 MED FILL — LOSARTAN-HCTZ 100-25 MG TAB: 100-25 | 90 days supply | Qty: 45 | Fill #0
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
## 2018-12-30 NOTE — Discharge Instructions (Signed)
Return here for any worsening in your condition.  Follow-up with your primary care doctor.

## 2019-01-01 DIAGNOSIS — S300XXA Contusion of lower back and pelvis, initial encounter: Secondary | ICD-10-CM | POA: Diagnosis not present

## 2019-01-01 DIAGNOSIS — S1093XA Contusion of unspecified part of neck, initial encounter: Secondary | ICD-10-CM | POA: Diagnosis not present

## 2019-01-01 DIAGNOSIS — S60212A Contusion of left wrist, initial encounter: Secondary | ICD-10-CM | POA: Diagnosis not present

## 2019-01-08 ENCOUNTER — Other Ambulatory Visit: Payer: Self-pay | Admitting: Adult Health

## 2019-01-08 DIAGNOSIS — Z853 Personal history of malignant neoplasm of breast: Secondary | ICD-10-CM

## 2019-01-08 DIAGNOSIS — Z9889 Other specified postprocedural states: Secondary | ICD-10-CM

## 2019-01-14 ENCOUNTER — Ambulatory Visit (INDEPENDENT_AMBULATORY_CARE_PROVIDER_SITE_OTHER): Payer: 59 | Admitting: Podiatry

## 2019-01-14 ENCOUNTER — Encounter: Payer: Self-pay | Admitting: Podiatry

## 2019-01-14 ENCOUNTER — Other Ambulatory Visit: Payer: Self-pay

## 2019-01-14 DIAGNOSIS — M205X9 Other deformities of toe(s) (acquired), unspecified foot: Secondary | ICD-10-CM

## 2019-01-14 DIAGNOSIS — M779 Enthesopathy, unspecified: Secondary | ICD-10-CM

## 2019-01-14 DIAGNOSIS — M25571 Pain in right ankle and joints of right foot: Secondary | ICD-10-CM | POA: Diagnosis not present

## 2019-01-14 NOTE — Progress Notes (Signed)
Subjective:   Patient ID: Melody Zhang, female   DOB: 63 y.o.   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

## 2019-02-10 ENCOUNTER — Other Ambulatory Visit (HOSPITAL_COMMUNITY): Payer: Self-pay | Admitting: Family Medicine

## 2019-02-10 DIAGNOSIS — G4733 Obstructive sleep apnea (adult) (pediatric): Secondary | ICD-10-CM | POA: Diagnosis not present

## 2019-02-10 DIAGNOSIS — Z6841 Body Mass Index (BMI) 40.0 and over, adult: Secondary | ICD-10-CM | POA: Diagnosis not present

## 2019-02-10 DIAGNOSIS — E119 Type 2 diabetes mellitus without complications: Secondary | ICD-10-CM | POA: Diagnosis not present

## 2019-02-10 DIAGNOSIS — I10 Essential (primary) hypertension: Secondary | ICD-10-CM | POA: Diagnosis not present

## 2019-02-10 DIAGNOSIS — I471 Supraventricular tachycardia: Secondary | ICD-10-CM | POA: Diagnosis not present

## 2019-02-10 DIAGNOSIS — E7800 Pure hypercholesterolemia, unspecified: Secondary | ICD-10-CM | POA: Diagnosis not present

## 2019-02-10 DIAGNOSIS — C50912 Malignant neoplasm of unspecified site of left female breast: Secondary | ICD-10-CM | POA: Diagnosis not present

## 2019-02-10 DIAGNOSIS — Z0000 Encounter for general adult medical examination without abnormal findings: Secondary | ICD-10-CM | POA: Diagnosis not present

## 2019-02-10 MED FILL — IBUPROFEN 800 MG TABS: 800 | 90 days supply | Qty: 270 | Fill #0

## 2019-02-19 DIAGNOSIS — E7800 Pure hypercholesterolemia, unspecified: Secondary | ICD-10-CM | POA: Diagnosis not present

## 2019-02-19 DIAGNOSIS — I10 Essential (primary) hypertension: Secondary | ICD-10-CM | POA: Diagnosis not present

## 2019-02-19 DIAGNOSIS — E119 Type 2 diabetes mellitus without complications: Secondary | ICD-10-CM | POA: Diagnosis not present

## 2019-02-22 ENCOUNTER — Other Ambulatory Visit: Payer: Self-pay

## 2019-02-22 ENCOUNTER — Ambulatory Visit
Admission: RE | Admit: 2019-02-22 | Discharge: 2019-02-22 | Disposition: A | Discharge status: Home / Self Care | Payer: 59 | Source: Ambulatory Visit | Attending: Adult Health | Admitting: Adult Health

## 2019-02-22 DIAGNOSIS — Z9889 Other specified postprocedural states: Secondary | ICD-10-CM

## 2019-02-22 DIAGNOSIS — Z853 Personal history of malignant neoplasm of breast: Secondary | ICD-10-CM

## 2019-02-22 DIAGNOSIS — R928 Other abnormal and inconclusive findings on diagnostic imaging of breast: Secondary | ICD-10-CM | POA: Diagnosis not present

## 2019-03-09 NOTE — Progress Notes (Signed)
Patient Care Team: Maury Dus, MD as PCP - General (Family Medicine) Kyung Rudd, MD as Consulting Physician (Radiation Oncology) Nicholas Lose, MD as Consulting Physician (Hematology and Oncology) Fanny Skates, MD as Consulting Physician (General Surgery) Delice Bison, Charlestine Massed, NP as Nurse Practitioner (Hematology and Oncology)  DIAGNOSIS:    ICD-10-CM   1. Malignant neoplasm of upper-inner quadrant of left breast in female, estrogen receptor positive ([place])  C50.212    Z17.0     SUMMARY OF ONCOLOGIC HISTORY: Oncology History  Malignant neoplasm of upper-inner quadrant of left breast in female, estrogen receptor positive ([place])  02/24/2017 Initial Diagnosis   Screening detected left breast mass UIQ 1130 position: 1.9 cm, ultrasound biopsy: IDC grade 2, DCIS, ER 100%, PR 20%, Ki-67 25%, HER-2 negative ratio 1.43, T1 cN0 stage I a AJCC 8   03/17/2017 Genetic Testing   Negative genetic testing on the 9-gene STAT panel.  The STAT Breast cancer panel offered by [facility] includes sequencing and rearrangement analysis for the following 9 genes:  ATM, BRCA1, BRCA2, CDH1, CHEK2, PALB2, PTEN, STK11 and TP53.   The report date is March 17, 2017.  Testing was reflexed to larger common hereditary cancer panel.    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  The Hereditary Gene Panel offered by [facility] includes sequencing and/or deletion duplication testing of the following 47 genes: APC, ATM, AXIN2, BARD1, BMPR1A, BRCA1, BRCA2, BRIP1, CDH1, CDK4, CDKN2A (p14ARF), CDKN2A (p16INK4a), CHEK2, CTNNA1, DICER1, EPCAM (Deletion/duplication testing only), GREM1 (promoter region deletion/duplication testing only), KIT, MEN1, MLH1, MSH2, MSH3, MSH6, MUTYH, NBN, NF1, NHTL1, PALB2, PDGFRA, PMS2, POLD1, POLE, PTEN, RAD50, RAD51C, RAD51D, SDHB, SDHC, SDHD, SMAD4, SMARCA4. STK11, TP53, TSC1, TSC2, and VHL.  The following genes were evaluated for sequence changes only: SDHA and HOXB13  c.251G>A variant only. The report date is March 19, 2017.    03/21/2017 Surgery   Left lumpectomy: IDC grade 2, 1.5 cm, DCIS intermediate grade, margins negative, intraductal papilloma and fibrocystic changes, 0/3 lymph nodes negative, ER 100%, PR 20%, HER-2 negative, Ki-67 25%, T1CN0 stage I a   04/10/2017 Oncotype testing   Oncotype DX recurrence score 20: 6% risk of recurrence with hormone therapy   05/12/2017 - 06/06/2017 Radiation Therapy   Adjuvant radiation therapy: Left breast treated to 42.5 Gy with 17 fx of 2.5 Gy followed by a boost of 7.5 Gy with 3 fx of 2.5 Gy   06/2017 -  Anti-estrogen oral therapy   Letrozole daily     CHIEF COMPLIANT: Follow-up of left breast cancer on letrozole therapy  INTERVAL HISTORY: Melody Zhang is a 64 y.o. with above-mentioned history of left breast cancer treated with lumpectomy, radiation, and who is currently on letrozole therapy. Mammogram on 02/22/19 showed no evidence of malignancy bilaterally. She presents to the clinic today for annual follow-up.  She is tolerating the letrozole extremely well.  She does have hot flashes sometimes they can be pretty severe but that she is able to manage it.  Denies any arthralgias or myalgias.  Denies any nausea or vomiting.  She is still tender in the axilla but otherwise without any lumps or nodules.  ALLERGIES:  is allergic to dobutamine and lisinopril.  MEDICATIONS:  Current Outpatient Medications  Medication Sig Dispense Refill  . calcium carbonate (TUMS - DOSED IN MG ELEMENTAL CALCIUM) 500 MG chewable tablet Chew 2 tablets by mouth 3 (three) times daily as needed for indigestion or heartburn.    . diclofenac sodium (VOLTAREN) 1 % GEL  Apply 2-4 g topically 4 (four) times daily. (Patient taking differently: Apply 2-4 g topically daily as needed (knee pain). ) 5 Tube 3  . diltiazem (CARDIZEM) 120 MG tablet Take 120 mg by mouth every evening.  0  . ferrous sulfate 325 (65 FE) MG tablet Take 325 mg by  mouth every evening.    Marland Kitchen ibuprofen (ADVIL) 800 MG tablet Take 1 tablet (800 mg total) by mouth every 8 (eight) hours as needed. 21 tablet 0  . KLOR-CON M20 20 MEQ tablet TAKE ONE TABLET BY MOUTH EVERY DAY (Patient taking differently: Take 20 mEq by mouth every evening. ) 15 tablet 0  . letrozole (FEMARA) 2.5 MG tablet TAKE 1 TABLET BY MOUTH DAILY. (Patient taking differently: Take 2.5 mg by mouth daily. ) 90 tablet 3  . losartan-hydrochlorothiazide (HYZAAR) 100-25 MG tablet Take 1 tablet by mouth every evening.  1  . metFORMIN (GLUCOPHAGE-XR) 750 MG 24 hr tablet Take 750 mg by mouth every evening.  0  . nitroGLYCERIN (NITROSTAT) 0.4 MG SL tablet Place 1 tablet (0.4 mg total) under the tongue every 5 (five) minutes x 3 doses as needed for chest pain. 25 tablet 3  . Rivaroxaban 15 & 20 MG TBPK Take as directed on package: Start with one 69m tablet by mouth twice a day with food. On Day 22, switch to one 214mtablet once a day 51 each 0  . rosuvastatin (CRESTOR) 20 MG tablet Take 20 mg by mouth every evening.    . vitamin C (ASCORBIC ACID) 500 MG tablet Take 500 mg by mouth every evening.      No current facility-administered medications for this visit.    PHYSICAL EXAMINATION: ECOG PERFORMANCE STATUS: 1 - Symptomatic but completely ambulatory  Vitals:   03/10/19 0851  BP: 134/83  Pulse: 83  Resp: 17  Temp: (!) 97.1 F (36.2 C)  SpO2: 100%   Filed Weights   03/10/19 0851  Weight: 281 lb (127.5 kg)    BREAST: No palpable masses or nodules in either right or left breasts. No palpable axillary supraclavicular or infraclavicular adenopathy no breast tenderness or nipple discharge. (exam performed in the presence of a chaperone)  LABORATORY DATA:  I have reviewed the data as listed CMP Latest Ref Rng & Units 03/18/2017 03/05/2017 04/12/2011  Glucose 65 - 99 mg/dL 99 102 97  BUN 6 - 20 mg/dL '13 22 18  ' Creatinine 0.44 - 1.00 mg/dL 0.93 0.92 0.8  Sodium 135 - 145 mmol/L 140 140 139    Potassium 3.5 - 5.1 mmol/L 3.5 3.5 3.7  Chloride 101 - 111 mmol/L 102 102 102  CO2 22 - 32 mmol/L '26 26 29  ' Calcium 8.9 - 10.3 mg/dL 9.7 9.9 9.6  Total Protein 6.4 - 8.3 g/dL - 7.4 -  Total Bilirubin 0.2 - 1.2 mg/dL - 0.3 -  Alkaline Phos 40 - 150 U/L - 73 -  AST 5 - 34 U/L - 14 -  ALT 0 - 55 U/L - 13 -    Lab Results  Component Value Date   WBC 7.4 03/18/2017   HGB 12.0 03/18/2017   HCT 38.1 03/18/2017   MCV 81.8 03/18/2017   PLT 280 03/18/2017   NEUTROABS 7.0 (H) 03/05/2017    ASSESSMENT & PLAN:  Malignant neoplasm of upper-inner quadrant of left breast in female, estrogen receptor positive ([place]/22/2019:Left lumpectomy: IDC grade 2, 1.5 cm, DCIS intermediate grade, margins negative, intraductal papilloma and fibrocystic changes, 0/3 lymph nodes negative, ER  100%, PR 20%, HER-2 negative, Ki-67 25%, T1CN0 stage I a Oncotype DX recurrence score 20: 6% risk of recurrence with hormone therapy  Adjuvant radiation therapy started 05/12/2017-06/06/2017 Current treatment: Letrozole 2.5 mg daily x5 to 7 years started 06/28/2017  Letrozole toxicities: Denies any adverse effects to letrozole.  Breast cancer surveillance: 1.  Breast exam 03/10/2019: Benign 2.  Mammogram 02/22/2019: No evidence of malignancy postsurgical changes left breast breast density category B Patient is a Education administrator in the ED at [employer]  Return to clinic in 1 year for follow-up   No orders of the defined types were placed in this encounter.  The patient has a good understanding of the overall plan. she agrees with it. she will call with any problems that may develop before the next visit here.  Total time spent: 20 mins including face to face time and time spent for planning, charting and coordination of care  Nicholas Lose, MD 03/10/2019  I, Molly Dorshimer, am acting as scribe for Dr. Nicholas Lose.  I have reviewed the above documentation for accuracy and completeness, and I agree with  the above.

## 2019-03-10 ENCOUNTER — Inpatient Hospital Stay: Payer: 59 | Attending: Hematology and Oncology | Admitting: Hematology and Oncology

## 2019-03-10 ENCOUNTER — Other Ambulatory Visit: Payer: Self-pay

## 2019-03-10 DIAGNOSIS — Z170 Estrogen receptor positive status [ER+]: Secondary | ICD-10-CM | POA: Diagnosis not present

## 2019-03-10 DIAGNOSIS — Z923 Personal history of irradiation: Secondary | ICD-10-CM | POA: Insufficient documentation

## 2019-03-10 DIAGNOSIS — C50212 Malignant neoplasm of upper-inner quadrant of left female breast: Secondary | ICD-10-CM | POA: Insufficient documentation

## 2019-03-10 DIAGNOSIS — Z79811 Long term (current) use of aromatase inhibitors: Secondary | ICD-10-CM | POA: Insufficient documentation

## 2019-03-10 DIAGNOSIS — N951 Menopausal and female climacteric states: Secondary | ICD-10-CM | POA: Insufficient documentation

## 2019-03-10 MED ORDER — LETROZOLE 2.5 MG PO TABS: 2.5000 mg | ORAL_TABLET | Freq: Every day | ORAL | 3 refills | Status: DC

## 2019-03-10 NOTE — Assessment & Plan Note (Signed)
03/21/2017:Left lumpectomy: IDC grade 2, 1.5 cm, DCIS intermediate grade, margins negative, intraductal papilloma and fibrocystic changes, 0/3 lymph nodes negative, ER 100%, PR 20%, HER-2 negative, Ki-67 25%, T1CN0 stage I a Oncotype DX recurrence score 20: 6% risk of recurrence with hormone therapy  Adjuvant radiation therapy started 05/12/2017-06/06/2017 Current treatment: Letrozole 2.5 mg daily x5 to 7 years started 06/28/2017  Letrozole toxicities: Denies any adverse effects to letrozole.  Breast cancer surveillance: 1.  Breast exam 03/10/2019: Benign 2.  Mammogram 02/22/2019: No evidence of malignancy postsurgical changes left breast breast density category B Patient is a pharmacist at [employer]  Return to clinic in 1 year for follow-up

## 2019-03-11 ENCOUNTER — Telehealth: Payer: Self-pay | Admitting: Hematology and Oncology

## 2019-03-11 NOTE — Telephone Encounter (Signed)
I talk with patient regarding schedule  

## 2019-03-22 MED FILL — dilTIAZem HCL 120 MG TABS: 120 | 90 days supply | Qty: 90 | Fill #1

## 2019-03-22 MED FILL — IBUPROFEN 800 MG TABS: 800 | 30 days supply | Qty: 90 | Fill #3

## 2019-03-22 MED FILL — LOSARTAN-HCTZ 100-25 MG TAB: 100-25 | 90 days supply | Qty: 45 | Fill #1

## 2019-03-22 MED FILL — LETROZOLE 2.5 MG TABS: 2.5 | 90 days supply | Qty: 90 | Fill #2

## 2019-03-22 MED FILL — POTASSIUM CHLORIDE CRYS ER: 20 | 90 days supply | Qty: 90 | Fill #1

## 2019-03-22 MED FILL — METFORMIN HCL ER 500 MG TB2: 500 | 90 days supply | Qty: 180 | Fill #1

## 2019-03-22 MED FILL — ROSUVASTATIN CALCIUM 20 MG: 20 | 90 days supply | Qty: 90 | Fill #0

## 2019-05-24 MED FILL — IBUPROFEN 800 MG TABS: 800 | 30 days supply | Qty: 90 | Fill #4

## 2019-05-25 MED FILL — LOSARTAN-HCTZ 100-25 MG TAB: 100-25 | 90 days supply | Qty: 45 | Fill #0

## 2019-05-28 IMAGING — MG DIGITAL SCREENING BILATERAL MAMMOGRAM WITH CAD
5 series · 5 of 5 positions shown · non-contrast
Comparison: Previous exam(s).

CLINICAL DATA: Screening.

EXAM:
DIGITAL SCREENING BILATERAL MAMMOGRAM WITH CAD

[R MLO]
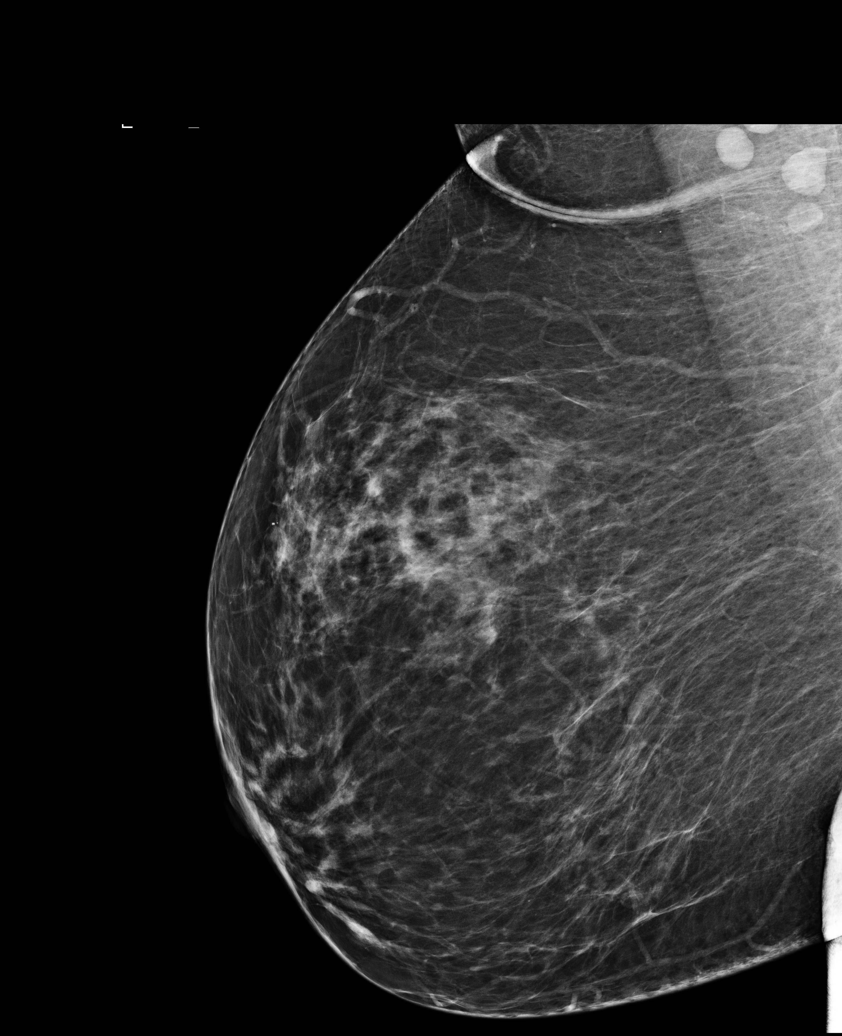

[L CC (1 of 2)]
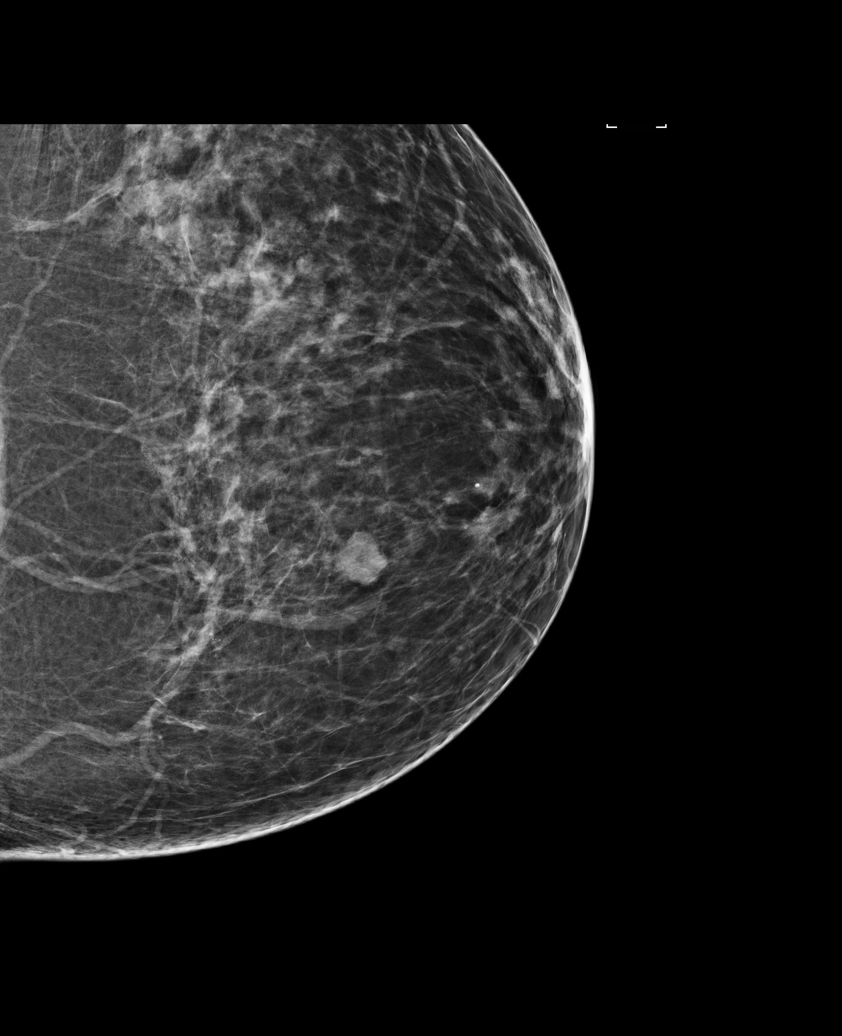

[L MLO]
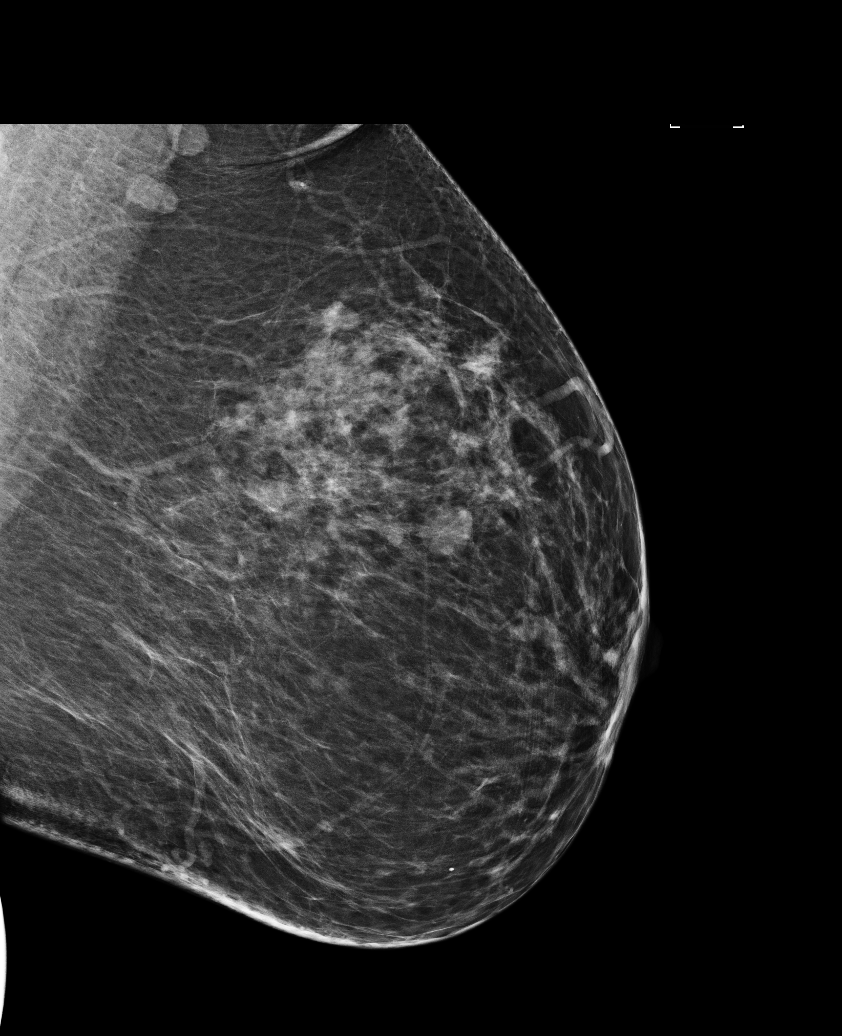

[R CC]
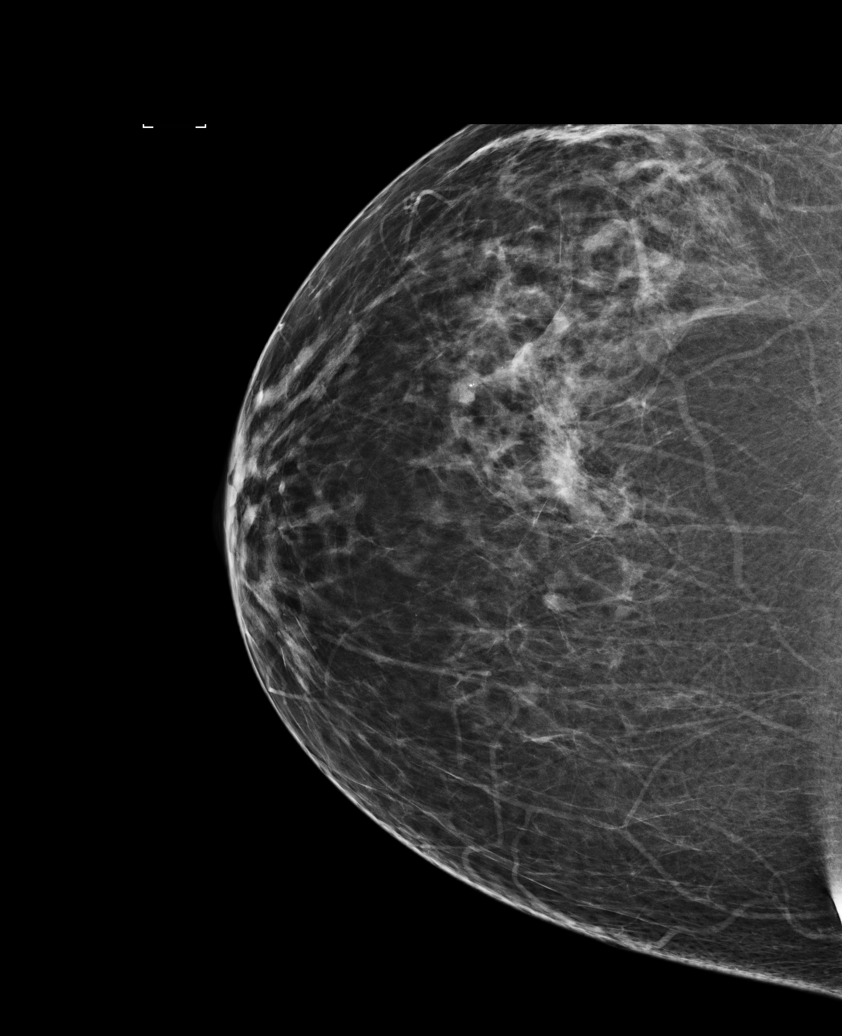

[L CC (2 of 2)]
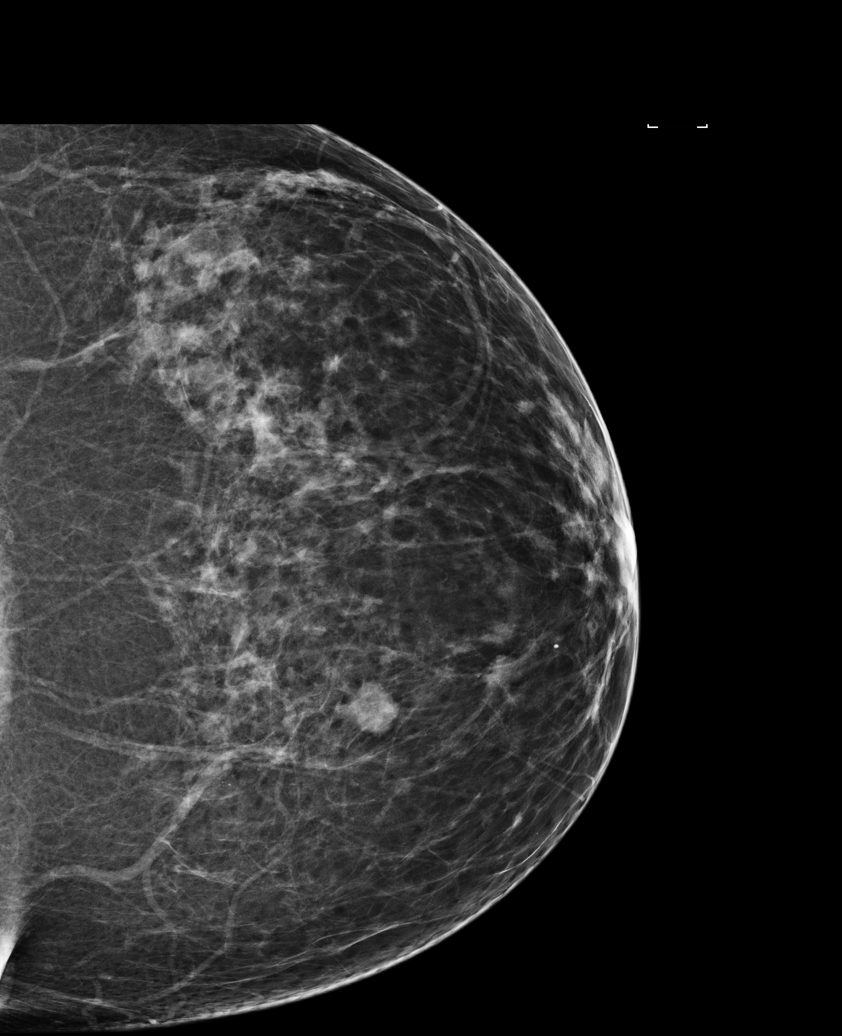

[5 of 5 positions shown; findings below may reference images not displayed]

ACR Breast Density Category b: There are scattered areas of
fibroglandular density.
FINDINGS: In the left breast, a possible mass warrants further evaluation. In
the right breast, no findings suspicious for malignancy.

Images were processed with CAD.
IMPRESSION: Further evaluation is suggested for possible mass in the left
breast.

RECOMMENDATION:
Diagnostic mammogram and possibly ultrasound of the left breast.
(Code:5M-3-PPT)

The patient will be contacted regarding the findings, and additional
imaging will be scheduled.

BI-RADS CATEGORY  0: Incomplete. Need additional imaging evaluation
and/or prior mammograms for comparison.

## 2019-06-07 DIAGNOSIS — H35033 Hypertensive retinopathy, bilateral: Secondary | ICD-10-CM | POA: Diagnosis not present

## 2019-06-07 DIAGNOSIS — H0288A Meibomian gland dysfunction right eye, upper and lower eyelids: Secondary | ICD-10-CM | POA: Diagnosis not present

## 2019-06-07 DIAGNOSIS — H2513 Age-related nuclear cataract, bilateral: Secondary | ICD-10-CM | POA: Diagnosis not present

## 2019-06-07 DIAGNOSIS — E119 Type 2 diabetes mellitus without complications: Secondary | ICD-10-CM | POA: Diagnosis not present

## 2019-06-07 DIAGNOSIS — H0288B Meibomian gland dysfunction left eye, upper and lower eyelids: Secondary | ICD-10-CM | POA: Diagnosis not present

## 2019-07-01 MED FILL — LOSARTAN-HCTZ 100-25 MG TAB: 100-25 | 90 days supply | Qty: 90 | Fill #0

## 2019-07-01 MED FILL — METFORMIN HCL ER 500 MG TB2: 500 | 90 days supply | Qty: 180 | Fill #0

## 2019-07-01 MED FILL — LETROZOLE 2.5 MG TABS: 2.5 | 90 days supply | Qty: 90 | Fill #0

## 2019-07-01 MED FILL — POTASSIUM CHLORIDE CRYS ER: 20 | 90 days supply | Qty: 90 | Fill #0

## 2019-07-01 MED FILL — ROSUVASTATIN CALCIUM 20 MG: 20 | 90 days supply | Qty: 90 | Fill #1

## 2019-07-01 MED FILL — dilTIAZem HCL 120 MG TABS: 120 | 90 days supply | Qty: 90 | Fill #0

## 2019-07-07 DIAGNOSIS — G4733 Obstructive sleep apnea (adult) (pediatric): Secondary | ICD-10-CM | POA: Diagnosis not present

## 2019-08-19 ENCOUNTER — Other Ambulatory Visit (HOSPITAL_COMMUNITY): Payer: Self-pay | Admitting: Family Medicine

## 2019-08-19 DIAGNOSIS — E1165 Type 2 diabetes mellitus with hyperglycemia: Secondary | ICD-10-CM | POA: Diagnosis not present

## 2019-08-19 DIAGNOSIS — Z170 Estrogen receptor positive status [ER+]: Secondary | ICD-10-CM | POA: Diagnosis not present

## 2019-08-19 DIAGNOSIS — I471 Supraventricular tachycardia: Secondary | ICD-10-CM | POA: Diagnosis not present

## 2019-08-19 DIAGNOSIS — G4733 Obstructive sleep apnea (adult) (pediatric): Secondary | ICD-10-CM | POA: Diagnosis not present

## 2019-08-19 DIAGNOSIS — E7800 Pure hypercholesterolemia, unspecified: Secondary | ICD-10-CM | POA: Diagnosis not present

## 2019-08-19 DIAGNOSIS — C50912 Malignant neoplasm of unspecified site of left female breast: Secondary | ICD-10-CM | POA: Diagnosis not present

## 2019-08-19 DIAGNOSIS — Z6841 Body Mass Index (BMI) 40.0 and over, adult: Secondary | ICD-10-CM | POA: Diagnosis not present

## 2019-08-19 DIAGNOSIS — I10 Essential (primary) hypertension: Secondary | ICD-10-CM | POA: Diagnosis not present

## 2019-08-27 DIAGNOSIS — E1165 Type 2 diabetes mellitus with hyperglycemia: Secondary | ICD-10-CM | POA: Diagnosis not present

## 2019-08-27 DIAGNOSIS — Z7984 Long term (current) use of oral hypoglycemic drugs: Secondary | ICD-10-CM | POA: Diagnosis not present

## 2019-08-27 DIAGNOSIS — I10 Essential (primary) hypertension: Secondary | ICD-10-CM | POA: Diagnosis not present

## 2019-08-27 DIAGNOSIS — D649 Anemia, unspecified: Secondary | ICD-10-CM | POA: Diagnosis not present

## 2019-09-24 MED FILL — ROSUVASTATIN CALCIUM 20 MG: 20 | 90 days supply | Qty: 90 | Fill #0

## 2019-09-28 MED FILL — LETROZOLE 2.5 MG TABS: 2.5 | 90 days supply | Qty: 90 | Fill #1

## 2019-10-22 ENCOUNTER — Other Ambulatory Visit (HOSPITAL_COMMUNITY): Payer: Self-pay | Admitting: Family Medicine

## 2019-10-22 MED FILL — LOSARTAN-HCTZ 100-25 MG TAB: 100-25 | 90 days supply | Qty: 90 | Fill #1

## 2019-10-22 MED FILL — POTASSIUM CHLORIDE CRYS ER: 20 | 90 days supply | Qty: 90 | Fill #0

## 2019-12-28 MED FILL — METFORMIN HCL ER 500 MG TB2: 500 | 90 days supply | Qty: 360 | Fill #0

## 2019-12-28 MED FILL — LETROZOLE 2.5 MG TABS: 2.5 | 90 days supply | Qty: 90 | Fill #2

## 2019-12-28 MED FILL — ROSUVASTATIN CALCIUM 20 MG: 20 | 90 days supply | Qty: 90 | Fill #1

## 2020-01-10 ENCOUNTER — Other Ambulatory Visit: Payer: Self-pay | Admitting: Family Medicine

## 2020-01-10 ENCOUNTER — Other Ambulatory Visit: Payer: Self-pay | Admitting: Adult Health

## 2020-01-10 DIAGNOSIS — Z9889 Other specified postprocedural states: Secondary | ICD-10-CM

## 2020-02-08 MED FILL — LOSARTAN-HCTZ 100-25 MG TAB: 30 days supply | Qty: 15 | Fill #0

## 2020-02-08 MED FILL — dilTIAZem HCL 120 MG TABS: 90 days supply | Qty: 90 | Fill #1

## 2020-02-08 MED FILL — dilTIAZem HCL 120 MG TABS: 120 | 90 days supply | Qty: 90 | Fill #1

## 2020-02-08 MED FILL — LOSARTAN-HCTZ 100-25 MG TAB: 100-25 | 30 days supply | Qty: 15 | Fill #0

## 2020-02-10 ENCOUNTER — Other Ambulatory Visit (HOSPITAL_COMMUNITY): Payer: Self-pay | Admitting: Family Medicine

## 2020-02-21 DIAGNOSIS — G4733 Obstructive sleep apnea (adult) (pediatric): Secondary | ICD-10-CM | POA: Diagnosis not present

## 2020-02-21 MED FILL — LOSARTAN-HCTZ 100-25 MG TAB: 100-25 | 30 days supply | Qty: 30 | Fill #0

## 2020-02-24 MED FILL — POTASSIUM CHLORIDE CRYS ER: 90 days supply | Qty: 90 | Fill #1

## 2020-02-24 MED FILL — POTASSIUM CHLORIDE CRYS ER: 20 | 90 days supply | Qty: 90 | Fill #1

## 2020-02-25 ENCOUNTER — Other Ambulatory Visit: Payer: Self-pay | Admitting: Adult Health

## 2020-02-25 ENCOUNTER — Other Ambulatory Visit: Payer: Self-pay

## 2020-02-25 ENCOUNTER — Ambulatory Visit
Admission: RE | Admit: 2020-02-25 | Discharge: 2020-02-25 | Disposition: A | Discharge status: Home / Self Care | Payer: 59 | Source: Ambulatory Visit | Attending: Adult Health | Admitting: Adult Health

## 2020-02-25 DIAGNOSIS — R921 Mammographic calcification found on diagnostic imaging of breast: Secondary | ICD-10-CM

## 2020-02-25 DIAGNOSIS — Z9889 Other specified postprocedural states: Secondary | ICD-10-CM

## 2020-02-25 DIAGNOSIS — R922 Inconclusive mammogram: Secondary | ICD-10-CM | POA: Diagnosis not present

## 2020-03-02 ENCOUNTER — Other Ambulatory Visit (HOSPITAL_COMMUNITY): Payer: Self-pay | Admitting: Family Medicine

## 2020-03-02 DIAGNOSIS — E7800 Pure hypercholesterolemia, unspecified: Secondary | ICD-10-CM | POA: Diagnosis not present

## 2020-03-02 DIAGNOSIS — I10 Essential (primary) hypertension: Secondary | ICD-10-CM | POA: Diagnosis not present

## 2020-03-02 DIAGNOSIS — G4733 Obstructive sleep apnea (adult) (pediatric): Secondary | ICD-10-CM | POA: Diagnosis not present

## 2020-03-02 DIAGNOSIS — N898 Other specified noninflammatory disorders of vagina: Secondary | ICD-10-CM | POA: Diagnosis not present

## 2020-03-02 DIAGNOSIS — E1165 Type 2 diabetes mellitus with hyperglycemia: Secondary | ICD-10-CM | POA: Diagnosis not present

## 2020-03-02 DIAGNOSIS — I471 Supraventricular tachycardia: Secondary | ICD-10-CM | POA: Diagnosis not present

## 2020-03-02 DIAGNOSIS — Z0000 Encounter for general adult medical examination without abnormal findings: Secondary | ICD-10-CM | POA: Diagnosis not present

## 2020-03-02 DIAGNOSIS — C50912 Malignant neoplasm of unspecified site of left female breast: Secondary | ICD-10-CM | POA: Diagnosis not present

## 2020-03-02 DIAGNOSIS — D649 Anemia, unspecified: Secondary | ICD-10-CM | POA: Diagnosis not present

## 2020-03-06 NOTE — Progress Notes (Deleted)
Cardiology Office Note:    Date:  03/06/2020   ID:  [name], [name] January 07, 1956, MRN [id number]  PCP:  Maury Dus, MD  [facility] Cardiologist:  No primary care provider on file.  [facility] Electrophysiologist:  None   Referring MD: Maury Dus, MD    History of Present Illness:    Melody Zhang is a 65 y.o. female with a hx of SVT, HTN, OSA, HLD, breast cancer and DMII who was referred by Dr. Alyson Ingles for further management of tachycardia.  Last saw Dr. Curt Bears in 2017 for left sided chest pain. TTE was normal and myoview was negative for ischemia. SVT at that time was well managed with diltiazem.  TC 167, HDL 47, TG 150  Past Medical History:  Diagnosis Date  . Breast cancer ([place]) 01/2017   LEFT BREAST CA  . Cancer [facility])    breast cancer  . Diabetes mellitus   . Family history of adverse reaction to anesthesia    Brother's heart stopped during surgery on urethra at [facility]  . Family history of breast cancer   . Family history of pancreatic cancer   . GERD (gastroesophageal reflux disease)    not on medication for this, TUMS PRN  . Heart murmur   . Hyperlipemia   . Hypertension   . Intermittent palpitations    once a month  . Paroxysmal SVT (supraventricular tachycardia) (HCC)    Myoview was performed 03/26/11: Low risk with small partially reversible apical perfusion defect likely breast attenuation, no ischemia, no scar, EF 62%.   . Personal history of radiation therapy 2019  . Sleep apnea    CPAP    Past Surgical History:  Procedure Laterality Date  . ABDOMINAL HYSTERECTOMY    . BREAST BIOPSY Left 02/24/2017  . BREAST LUMPECTOMY Left 03/21/2017  . BREAST LUMPECTOMY WITH RADIOACTIVE SEED AND SENTINEL LYMPH NODE BIOPSY Left 03/21/2017   Procedure: LEFT BREAST LUMPECTOMY WITH RADIOACTIVE SEED AND LEFT AXILLARY DEEP LYMPH NODE BIOPSY INJECTION BLUE DYE LEFT BREAST ERAS PATHWAY;  Surgeon: Fanny Skates, MD;  Location: [facility];  Service: General;   Laterality: Left;  . HERNIA REPAIR     umbilical hernia    Current Medications: No outpatient medications have been marked as taking for the 03/10/20 encounter (Appointment) with Freada Bergeron, MD.     Allergies:   Dobutamine and Lisinopril   Social History   Socioeconomic History  . Marital status: Divorced    Spouse name: Not on file  . Number of children: Not on file  . Years of education: Not on file  . Highest education level: Not on file  Occupational History  . Not on file  Tobacco Use  . Smoking status: Never Smoker  . Smokeless tobacco: Never Used  Vaping Use  . Vaping Use: Never used  Substance and Sexual Activity  . Alcohol use: No  . Drug use: No  . Sexual activity: Never  Other Topics Concern  . Not on file  Social History Narrative  . Not on file   Social Determinants of Health   Financial Resource Strain: Not on file  Food Insecurity: Not on file  Transportation Needs: Not on file  Physical Activity: Not on file  Stress: Not on file  Social Connections: Not on file     Family History: The patient's ***family history includes Healthy in her mother; Heart attack (age of onset: 97) in her father; Liver cancer in her maternal uncle; Lymphoma in her maternal aunt;  Multiple myeloma (age of onset: 61) in her mother; Pancreatic cancer in her cousin; Pancreatic cancer (age of onset: 39) in her maternal grandmother.  ROS:   Please see the history of present illness.    *** All other systems reviewed and are negative.  EKGs/Labs/Other Studies Reviewed:    The following studies were reviewed today: TTE 04/17/15: Study Conclusions   - Left ventricle: The cavity size was normal. There was mild  concentric hypertrophy. Systolic function was vigorous. The  estimated ejection fraction was in the range of 65% to 70%. Wall  motion was normal; there were no regional wall motion  abnormalities.  - Mitral valve: There was trivial regurgitation.  -  Left atrium: The atrium was mildly dilated.  - Right atrium: The atrium was mildly dilated.  - Pulmonary arteries: Systolic pressure was mildly increased. PA  peak pressure: 36 mm Hg (S).   Myoview 2015-04-17:  Nuclear stress EF: 57%.  There was no ST segment deviation noted during stress.  The study is normal.  This is a low risk study.  The left ventricular ejection fraction is normal (55-65%).    EKG:  EKG is *** ordered today.  The ekg ordered today demonstrates ***  Recent Labs: No results found for requested labs within last 8760 hours.  Recent Lipid Panel    Component Value Date/Time   CHOL 163 03/18/2011 0331   TRIG 114 03/18/2011 0331   HDL 49 03/18/2011 0331   CHOLHDL 3.3 03/18/2011 0331   VLDL 23 03/18/2011 0331   LDLCALC 91 03/18/2011 0331     Risk Assessment/Calculations:   {Does this patient have ATRIAL FIBRILLATION?:[id number]}   Physical Exam:    VS:  There were no vitals taken for this visit.    Wt Readings from Last 3 Encounters:  03/10/19 281 lb (127.5 kg)  03/09/18 284 lb 4.8 oz (129 kg)  11/05/17 270 lb (122.5 kg)     GEN: *** Well nourished, well developed in no acute distress HEENT: Normal NECK: No JVD; No carotid bruits LYMPHATICS: No lymphadenopathy CARDIAC: ***RRR, no murmurs, rubs, gallops RESPIRATORY:  Clear to auscultation without rales, wheezing or rhonchi  ABDOMEN: Soft, non-tender, non-distended MUSCULOSKELETAL:  No edema; No deformity  SKIN: Warm and dry NEUROLOGIC:  Alert and oriented x 3 PSYCHIATRIC:  Normal affect   ASSESSMENT:    No diagnosis found. PLAN:    In order of problems listed above:  #SVT:  #HTN:  #DMII:  #HLD:  #OSA on CPAP:  #Morbid Obesity:   {Are you ordering a CV Procedure (e.g. stress test, cath, DCCV, TEE, etc)?   Press F2        :[id number]}    Medication Adjustments/Labs and Tests Ordered: Current medicines are reviewed at length with the patient today.  Concerns regarding medicines  are outlined above.  No orders of the defined types were placed in this encounter.  No orders of the defined types were placed in this encounter.   There are no Patient Instructions on file for this visit.   Signed, Freada Bergeron, MD  03/06/2020 5:18 PM    [facility]

## 2020-03-08 NOTE — Progress Notes (Signed)
HEMATOLOGY-ONCOLOGY MYCHART VIDEO VISIT PROGRESS NOTE  I connected with Melody Zhang on 03/09/2020 at  8:30 AM EST by MyChart video conference and verified that I am speaking with the correct person using two identifiers.  I discussed the limitations, risks, security and privacy concerns of performing an evaluation and management service by MyChart and the availability of in person appointments.  I also discussed with the patient that there may be a patient responsible charge related to this service. The patient expressed understanding and agreed to proceed.  Patient's Location: Home Physician Location: Clinic  CHIEF COMPLIANT: Follow-up of left breast cancer on letrozole therapy  INTERVAL HISTORY: Melody Zhang is a 65 y.o. female with above-mentioned history of left breast cancer treated with lumpectomy, radiation, and who is currently on letrozole therapy. Mammogram on 02/22/19 showed probably benign left breast calcifications at the lumpectomy bed recommended for short-term follow-up. She presents [place] today for annual follow-up  Oncology History  Malignant neoplasm of upper-inner quadrant of left breast in female, estrogen receptor positive ([place])  02/24/2017 Initial Diagnosis   Screening detected left breast mass UIQ 1130 position: 1.9 cm, ultrasound biopsy: IDC grade 2, DCIS, ER 100%, PR 20%, Ki-67 25%, HER-2 negative ratio 1.43, T1 cN0 stage I a AJCC 8   03/17/2017 Genetic Testing   Negative genetic testing on the 9-gene STAT panel.  The STAT Breast cancer panel offered by [facility] includes sequencing and rearrangement analysis for the following 9 genes:  ATM, BRCA1, BRCA2, CDH1, CHEK2, PALB2, PTEN, STK11 and TP53.   The report date is March 17, 2017.  Testing was reflexed to larger common hereditary cancer panel.    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  The Hereditary Gene Panel offered by [facility] includes sequencing and/or deletion  duplication testing of the following 47 genes: APC, ATM, AXIN2, BARD1, BMPR1A, BRCA1, BRCA2, BRIP1, CDH1, CDK4, CDKN2A (p14ARF), CDKN2A (p16INK4a), CHEK2, CTNNA1, DICER1, EPCAM (Deletion/duplication testing only), GREM1 (promoter region deletion/duplication testing only), KIT, MEN1, MLH1, MSH2, MSH3, MSH6, MUTYH, NBN, NF1, NHTL1, PALB2, PDGFRA, PMS2, POLD1, POLE, PTEN, RAD50, RAD51C, RAD51D, SDHB, SDHC, SDHD, SMAD4, SMARCA4. STK11, TP53, TSC1, TSC2, and VHL.  The following genes were evaluated for sequence changes only: SDHA and HOXB13 c.251G>A variant only. The report date is March 19, 2017.    03/21/2017 Surgery   Left lumpectomy: IDC grade 2, 1.5 cm, DCIS intermediate grade, margins negative, intraductal papilloma and fibrocystic changes, 0/3 lymph nodes negative, ER 100%, PR 20%, HER-2 negative, Ki-67 25%, T1CN0 stage I a   04/10/2017 Oncotype testing   Oncotype DX recurrence score 20: 6% risk of recurrence with hormone therapy   05/12/2017 - 06/06/2017 Radiation Therapy   Adjuvant radiation therapy: Left breast treated to 42.5 Gy with 17 fx of 2.5 Gy followed by a boost of 7.5 Gy with 3 fx of 2.5 Gy   06/2017 -  Anti-estrogen oral therapy   Letrozole daily     Observations/Objective:  There were no vitals filed for this visit. There is no height or weight on file to calculate BMI.  I have reviewed the data as listed CMP Latest Ref Rng & Units 03/18/2017 03/05/2017 04/12/2011  Glucose 65 - 99 mg/dL 99 102 97  BUN 6 - 20 mg/dL '13 22 18  ' Creatinine 0.44 - 1.00 mg/dL 0.93 0.92 0.8  Sodium 135 - 145 mmol/L 140 140 139  Potassium 3.5 - 5.1 mmol/L 3.5 3.5 3.7  Chloride 101 - 111 mmol/L 102 102 102  CO2  22 - 32 mmol/L '26 26 29  ' Calcium 8.9 - 10.3 mg/dL 9.7 9.9 9.6  Total Protein 6.4 - 8.3 g/dL - 7.4 -  Total Bilirubin 0.2 - 1.2 mg/dL - 0.3 -  Alkaline Phos 40 - 150 U/L - 73 -  AST 5 - 34 U/L - 14 -  ALT 0 - 55 U/L - 13 -    Lab Results  Component Value Date   WBC 7.4 03/18/2017   HGB  12.0 03/18/2017   HCT 38.1 03/18/2017   MCV 81.8 03/18/2017   PLT 280 03/18/2017   NEUTROABS 7.0 (H) 03/05/2017      Assessment Plan:  Malignant neoplasm of upper-inner quadrant of left breast in female, estrogen receptor positive ([place]) 03/21/2017:Left lumpectomy: IDC grade 2, 1.5 cm, DCIS intermediate grade, margins negative, intraductal papilloma and fibrocystic changes, 0/3 lymph nodes negative, ER 100%, PR 20%, HER-2 negative, Ki-67 25%, T1CN0 stage I a Oncotype DX recurrence score 20: 6% risk of recurrence with hormone therapy  Adjuvant radiation therapy started 05/12/2017-06/06/2017 Current treatment:Letrozole 2.5 mg daily x5 to 7 yearsstarted 06/28/2017  Letrozoletoxicities: Denies any adverse effects to letrozole. Doing very well.  Breast cancer surveillance: 1.Breast exam 03/10/2019: Benign 2.Mammogram 02/25/2020: left lumpectomy bed calcs prob benign. 6 month follow up recommended. 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 She was started on Ozympic  Return to clinic in 1 year for follow-up    I discussed the assessment and treatment plan with the patient. The patient was provided an opportunity to ask questions and all were answered. The patient agreed with the plan and demonstrated an understanding of the instructions. The patient was advised to call back or seek an in-person evaluation if the symptoms worsen or if the condition fails to improve as anticipated.   Total time spent: 20 minutes including face-to-face MyChart video visit time and time spent for planning, charting and coordination of care  Rulon Eisenmenger, MD 03/09/2020   I, Cloyde Reams Dorshimer, am acting as scribe for Nicholas Lose, MD.  I have reviewed the above documentation for accuracy and completeness, and I agree with the above.

## 2020-03-09 ENCOUNTER — Other Ambulatory Visit: Payer: Self-pay | Admitting: Hematology and Oncology

## 2020-03-09 ENCOUNTER — Inpatient Hospital Stay: Payer: 59 | Attending: Hematology and Oncology | Admitting: Hematology and Oncology

## 2020-03-09 DIAGNOSIS — C50212 Malignant neoplasm of upper-inner quadrant of left female breast: Secondary | ICD-10-CM

## 2020-03-09 DIAGNOSIS — Z170 Estrogen receptor positive status [ER+]: Secondary | ICD-10-CM | POA: Diagnosis not present

## 2020-03-09 MED ORDER — LETROZOLE 2.5 MG PO TABS: 2.5000 mg | ORAL_TABLET | Freq: Every day | ORAL | 3 refills | Status: DC

## 2020-03-09 MED FILL — LETROZOLE 2.5 MG TABS: 90 days supply | Qty: 90 | Fill #0

## 2020-03-09 MED FILL — LETROZOLE 2.5 MG TABS: 2.5 | 90 days supply | Qty: 90 | Fill #0

## 2020-03-09 NOTE — Assessment & Plan Note (Signed)
03/21/2017:Left lumpectomy: IDC grade 2, 1.5 cm, DCIS intermediate grade, margins negative, intraductal papilloma and fibrocystic changes, 0/3 lymph nodes negative, ER 100%, PR 20%, HER-2 negative, Ki-67 25%, T1CN0 stage I a Oncotype DX recurrence score 20: 6% risk of recurrence with hormone therapy  Adjuvant radiation therapy started 05/12/2017-06/06/2017 Current treatment:Letrozole 2.5 mg daily x5 to 7 yearsstarted 06/28/2017  Letrozoletoxicities: Denies any adverse effects to letrozole.  Breast cancer surveillance: 1.Breast exam 03/10/2019: Benign 2.Mammogram 02/25/2020: left lumpectomy bed calcs prob benign. 6 month follow up recommended. breast density category B Patient is a Education administrator in the ED at [employer]  Return to clinic in 1 year for follow-up

## 2020-03-10 ENCOUNTER — Ambulatory Visit: Payer: 59 | Admitting: Cardiology

## 2020-03-20 ENCOUNTER — Ambulatory Visit (INDEPENDENT_AMBULATORY_CARE_PROVIDER_SITE_OTHER): Payer: 59

## 2020-03-20 ENCOUNTER — Encounter: Payer: Self-pay | Admitting: *Deleted

## 2020-03-20 ENCOUNTER — Encounter: Payer: Self-pay | Admitting: Cardiology

## 2020-03-20 ENCOUNTER — Ambulatory Visit (INDEPENDENT_AMBULATORY_CARE_PROVIDER_SITE_OTHER): Payer: 59 | Admitting: Cardiology

## 2020-03-20 ENCOUNTER — Other Ambulatory Visit: Payer: Self-pay

## 2020-03-20 VITALS — BP 128/74 | HR 68 | Ht 67.0 in | Wt 273.8 lb

## 2020-03-20 DIAGNOSIS — R002 Palpitations: Secondary | ICD-10-CM

## 2020-03-20 NOTE — Patient Instructions (Signed)
Medication Instructions:  Your physician recommends that you continue on your current medications as directed. Please refer to the Current Medication list given to you today.  *If you need a refill on your cardiac medications before your next appointment, please call your pharmacy*   Lab Work: None ordered   Testing/Procedures: ZIO XT- Long Term Monitor Instructions                             Your physician has requested you wear your ZIO patch monitor for 14 days.   This is a single patch monitor.  [employer] supplies one patch monitor per enrollment.  Additional stickers are not available.   Please do not apply patch if you will be having a Nuclear Stress Test, Echocardiogram, Cardiac CT, MRI, or Chest Xray during the time frame you would be wearing the monitor. The patch cannot be worn during these tests.  You cannot remove and re-apply the ZIO XT patch monitor.   Your ZIO patch monitor will be sent USPS Priority mail from [facility] directly to your home address. The monitor may also be mailed to a PO BOX if home delivery is not available.   It may take 3-5 days to receive your monitor after you have been enrolled.   Once you have received you monitor, please review enclosed instructions.  Your monitor has already been registered assigning a specific monitor serial # to you.   Applying the monitor   Shave hair from upper left chest.   Hold abrader disc by orange tab.  Rub abrader in 40 strokes over left upper chest as indicated in your monitor instructions.   Clean area with 4 enclosed alcohol pads .  Use all pads to assure are is cleaned thoroughly.  Let dry.   Apply patch as indicated in monitor instructions.  Patch will be place under collarbone on left side of chest with arrow pointing upward.   Rub patch adhesive wings for 2 minutes.Remove white label marked "1".  Remove white label marked "2".  Rub patch adhesive wings for 2 additional minutes.   While looking  in a mirror, press and release button in center of patch.  A small green light will flash 3-4 times .  This will be your only indicator the monitor has been turned on.     Do not shower for the first 24 hours.  You may shower after the first 24 hours.   Press button if you feel a symptom. You will hear a small click.  Record Date, Time and Symptom in the Patient Log Book.   When you are ready to remove patch, follow instructions on last 2 pages of Patient Log Book.  Stick patch monitor onto last page of Patient Log Book.   Place Patient Log Book in [place] box.  Use locking tab on box and tape box closed securely.  The [employer] has [employer] on it.  Please place in mailbox as soon as possible.  Your physician should have your test results approximately 7 days after the monitor has been mailed back to [facility].   Call [facility] at [phone] if you have questions regarding your ZIO XT patch monitor.  Call them immediately if you see an orange light blinking on your monitor.   If your monitor falls off in less than 4 days contact our Monitor department at [phone].  If your monitor becomes loose or  falls off after 4 days call [employer] at [phone] for suggestions on securing your monitor.     Follow-Up: At [facility], you and your health needs are our priority.  As part of our continuing mission to provide you with exceptional heart care, we have created designated Provider Care Teams.  These Care Teams include your primary Cardiologist (physician) and Advanced Practice Providers (APPs -  Physician Assistants and Nurse Practitioners) who all work together to provide you with the care you need, when you need it.  Your next appointment:   6 week(s)  The format for your next appointment:   In Person  Provider:   Allegra Lai, MD    Thank you for choosing [facility]!!   Trinidad Curet, RN [phone]   Other  Instructions

## 2020-03-20 NOTE — Progress Notes (Signed)
 Electrophysiology Office Note   Date:  03/20/2020   ID:  Melody Zhang, DOB 05/05/1955, MRN [id number]  PCP:  Reade, Robert, MD Primary Electrophysiologist:  Will Martin Camnitz, MD    No chief complaint on file.    History of Present Illness: Melody Zhang is a 64 y.o. female who presents today for electrophysiology evaluation.     She has a history significant for breast cancer, diabetes, hypertension, hyperlipidemia, SVT, sleep apnea. She was initially seen by Dr. Allred in 2013. She wished to avoid catheter ablation at that time. She represented to cardiology clinic in 2017 with palpitations and was put on diltiazem.  Today, denies symptoms of chest pain, orthopnea, PND, lower extremity edema, claudication, dizziness, presyncope, syncope, bleeding, or neurologic sequela. The patient is tolerating medications without difficulties.  Over the past few weeks, she complains of intermittent skipped beats and shortness of breath.  These only occur when she is at rest.  When she is up walking at work, she does not notice any issues.  She said that they occur up to 4 times a week.  There are no other exacerbating or alleviating factors.  She states that most the time she can do all of her daily activities, and is only limited by these episodes.   Past Medical History:  Diagnosis Date  . Breast cancer (HCC) 01/2017   LEFT BREAST CA  . Cancer (HCC)    breast cancer  . Diabetes mellitus   . Family history of adverse reaction to anesthesia    Brother's heart stopped during surgery on urethra at [facility]  . Family history of breast cancer   . Family history of pancreatic cancer   . GERD (gastroesophageal reflux disease)    not on medication for this, TUMS PRN  . Heart murmur   . Hyperlipemia   . Hypertension   . Intermittent palpitations    once a month  . Paroxysmal SVT (supraventricular tachycardia) (HCC)    Myoview was performed 03/26/11: Low risk with small partially reversible  apical perfusion defect likely breast attenuation, no ischemia, no scar, EF 62%.   . Personal history of radiation therapy 2019  . Sleep apnea    CPAP   Past Surgical History:  Procedure Laterality Date  . ABDOMINAL HYSTERECTOMY    . BREAST BIOPSY Left 02/24/2017  . BREAST LUMPECTOMY Left 03/21/2017  . BREAST LUMPECTOMY WITH RADIOACTIVE SEED AND SENTINEL LYMPH NODE BIOPSY Left 03/21/2017   Procedure: LEFT BREAST LUMPECTOMY WITH RADIOACTIVE SEED AND LEFT AXILLARY DEEP LYMPH NODE BIOPSY INJECTION BLUE DYE LEFT BREAST ERAS PATHWAY;  Surgeon: Ingram, Haywood, MD;  Location: [facility];  Service: General;  Laterality: Left;  . HERNIA REPAIR     umbilical hernia     Current Outpatient Medications  Medication Sig Dispense Refill  . acetaminophen (TYLENOL) 500 MG tablet Take 1 tablet by mouth as needed.    . aspirin 81 MG chewable tablet Chew 1 tablet by mouth daily.    . calcium carbonate (TUMS - DOSED IN MG ELEMENTAL CALCIUM) 500 MG chewable tablet Chew 2 tablets by mouth 3 (three) times daily as needed for indigestion or heartburn.    . diclofenac sodium (VOLTAREN) 1 % GEL Apply 2-4 g topically 4 (four) times daily. 5 Tube 3  . diltiazem (CARDIZEM) 120 MG tablet Take 120 mg by mouth every evening.  0  . famotidine (PEPCID) 20 MG tablet Take 40 mg by mouth as needed. at night    . KLOR-CON M20 20   MEQ tablet TAKE ONE TABLET BY MOUTH EVERY DAY 15 tablet 0  . letrozole (FEMARA) 2.5 MG tablet Take 1 tablet (2.5 mg total) by mouth daily. 90 tablet 3  . losartan-hydrochlorothiazide (HYZAAR) 100-25 MG tablet Take 1 tablet by mouth every evening.  1  . metFORMIN (GLUCOPHAGE-XR) 500 MG 24 hr tablet Take 1,000 mg by mouth daily.    . naproxen sodium (ALEVE) 220 MG tablet Take 1 tablet by mouth as needed.    . rosuvastatin (CRESTOR) 20 MG tablet Take 20 mg by mouth every evening.    . Semaglutide,0.25 or 0.5MG/DOS, (OZEMPIC, 0.25 OR 0.5 MG/DOSE,) 2 MG/1.5ML SOPN Inject 0.5 mg into the skin See admin  instructions.    . nitroGLYCERIN (NITROSTAT) 0.4 MG SL tablet Place 1 tablet (0.4 mg total) under the tongue every 5 (five) minutes x 3 doses as needed for chest pain. 25 tablet 3   No current facility-administered medications for this visit.    Allergies:   Dobutamine and Lisinopril   Social History:  The patient  reports that she has never smoked. She has never used smokeless tobacco. She reports that she does not drink alcohol and does not use drugs.   Family History:  The patient's family history includes Healthy in her mother; Heart attack (age of onset: 82) in her father; Liver cancer in her maternal uncle; Lymphoma in her maternal aunt; Multiple myeloma (age of onset: 51) in her mother; Pancreatic cancer in her cousin; Pancreatic cancer (age of onset: 82) in her maternal grandmother.   ROS:  Please see the history of present illness.   Otherwise, review of systems is positive for none.   All other systems are reviewed and negative.   PHYSICAL EXAM: VS:  BP 128/74   Pulse 68   Ht 5' 7" (1.702 m)   Wt 273 lb 12.8 oz (124.2 kg)   SpO2 96%   BMI 42.88 kg/m  , BMI Body mass index is 42.88 kg/m. GEN: Well nourished, well developed, in no acute distress  HEENT: normal  Neck: no JVD, carotid bruits, or masses Cardiac: RRR; no murmurs, rubs, or gallops,no edema  Respiratory:  clear to auscultation bilaterally, normal work of breathing GI: soft, nontender, nondistended, + BS MS: no deformity or atrophy  Skin: warm and dry Neuro:  Strength and sensation are intact Psych: euthymic mood, full affect  EKG:  EKG is ordered today. Personal review of the ekg ordered shows sinus rhythm, rate 68  Recent Labs: No results found for requested labs within last 8760 hours.    Lipid Panel     Component Value Date/Time   CHOL 163 03/18/2011 0331   TRIG 114 03/18/2011 0331   HDL 49 03/18/2011 0331   CHOLHDL 3.3 03/18/2011 0331   VLDL 23 03/18/2011 0331   LDLCALC 91 03/18/2011 0331      Wt Readings from Last 3 Encounters:  03/20/20 273 lb 12.8 oz (124.2 kg)  03/10/19 281 lb (127.5 kg)  03/09/18 284 lb 4.8 oz (129 kg)      Other studies Reviewed: Additional studies/ records that were reviewed today include: TTE 09/12/2015 - Left ventricle: The cavity size was normal. There was mild  concentric hypertrophy. Systolic function was vigorous. The  estimated ejection fraction was in the range of 65% to 70%. Wall  motion was normal; there were no regional wall motion  abnormalities.  - Mitral valve: There was trivial regurgitation.  - Left atrium: The atrium was mildly dilated.  - Right  atrium: The atrium was mildly dilated.  - Pulmonary arteries: Systolic pressure was mildly increased. PA  peak pressure: 36 mm Hg (S).   Myoview 08/22/2015  Nuclear stress EF: 57%.  There was no ST segment deviation noted during stress.  The study is normal.  This is a low risk study.  The left ventricular ejection fraction is normal (55-65%).     ASSESSMENT AND PLAN:  1. Palpitations: She has been having skipped beats and shortness of breath.  She does not feel that she has had any fast rhythms.  She has previously a diagnosis of SVT but this appears to be well controlled with diltiazem.  Will order a 2-week monitor to further determine if she is having any bradycardia arrhythmias.    Current medicines are reviewed at length with the patient today.   The patient does not have concerns regarding her medicines.  The following changes were made today: None  Labs/ tests ordered today include:  Orders Placed This Encounter  Procedures  . LONG TERM MONITOR (3-14 DAYS)  . EKG 12-Lead     Disposition:   FU with Will Camnitz 6 weeks  Signed, Will Martin Camnitz, MD  03/20/2020 10:45 AM     [facility] [address] [place] [address] [phone] (office) [phone] (fax) 

## 2020-03-20 NOTE — Progress Notes (Signed)
Patient ID: Melody Zhang, female   DOB: 12/26/1955, 65 y.o.   MRN: [id number] Patient enrolled for [employer] to ship a 14 day ZIO XT long term holter monitor to her home.

## 2020-03-23 DIAGNOSIS — R002 Palpitations: Secondary | ICD-10-CM

## 2020-03-31 MED FILL — ROSUVASTATIN CALCIUM 20 MG: 90 days supply | Qty: 90 | Fill #0

## 2020-03-31 MED FILL — LETROZOLE 2.5 MG TABS: 90 days supply | Qty: 90 | Fill #0

## 2020-03-31 MED FILL — ROSUVASTATIN CALCIUM 20 MG: 20 | 90 days supply | Qty: 90 | Fill #0

## 2020-03-31 MED FILL — LOSARTAN-HCTZ 100-25 MG TAB: 100-25 | 30 days supply | Qty: 30 | Fill #1

## 2020-03-31 MED FILL — LETROZOLE 2.5 MG TABS: 2.5 | 90 days supply | Qty: 90 | Fill #0

## 2020-04-07 MED FILL — OZEMPIC 0.25 OR 0.5 MG/DOSE: 28 days supply | Fill #0

## 2020-04-07 MED FILL — OZEMPIC 0.25 OR 0.5 MG/DOSE: 2 | 28 days supply | Qty: 2 | Fill #0

## 2020-04-10 ENCOUNTER — Other Ambulatory Visit (HOSPITAL_COMMUNITY): Payer: Self-pay | Admitting: Family Medicine

## 2020-04-10 MED FILL — OZEMPIC (1 MG/DOSE) 4 MG/3M: 84 days supply | Qty: 9 | Fill #0

## 2020-04-13 DIAGNOSIS — R002 Palpitations: Secondary | ICD-10-CM | POA: Diagnosis not present

## 2020-05-08 ENCOUNTER — Other Ambulatory Visit: Payer: Self-pay

## 2020-05-08 ENCOUNTER — Ambulatory Visit (INDEPENDENT_AMBULATORY_CARE_PROVIDER_SITE_OTHER): Payer: 59 | Admitting: Cardiology

## 2020-05-08 ENCOUNTER — Encounter: Payer: Self-pay | Admitting: Cardiology

## 2020-05-08 VITALS — BP 122/72 | HR 84 | Ht 67.0 in | Wt 270.2 lb

## 2020-05-08 DIAGNOSIS — I471 Supraventricular tachycardia: Secondary | ICD-10-CM

## 2020-05-08 NOTE — Progress Notes (Signed)
Electrophysiology Office Note   Date:  05/08/2020   ID:  Melody Zhang, Melody Zhang 08-Jun-1955, MRN [id number]  PCP:  Maury Dus, MD Primary Electrophysiologist:  Saliha Salts Meredith Leeds, MD    No chief complaint on file.    History of Present Illness: Melody Zhang is a 65 y.o. female who presents today for electrophysiology evaluation.     She has a history of breast cancer, diabetes, hypertension, hyperlipidemia, SVT, and sleep apnea.  She was initially seen by Dr. Rayann Heman in 2013.  She wanted to avoid catheter ablation at that time.  She presented to cardiology clinic in 2017 with palpitations and was put on Cardizem.  She continued to complain of palpitations.  She wore a 2-week monitor that showed 3 episodes of SVT, longest of 12 beats.  Today, denies symptoms of palpitations, chest pain, shortness of breath, orthopnea, PND, lower extremity edema, claudication, dizziness, presyncope, syncope, bleeding, or neurologic sequela. The patient is tolerating medications without difficulties.  She continues to have palpitations.  Palpitations occur every few days.  They last a minute at a time.  They occur at times when she is sitting down at her computer as she works from home, but other times they occur when she is active.   Past Medical History:  Diagnosis Date  . Breast cancer ([place]) 01/2017   LEFT BREAST CA  . Cancer [facility])    breast cancer  . Diabetes mellitus   . Family history of adverse reaction to anesthesia    Brother's heart stopped during surgery on [facility]ra at [facility]  . Family history of breast cancer   . Family history of pancreatic cancer   . GERD (gastroesophageal reflux disease)    not on medication for this, TUMS PRN  . Heart murmur   . Hyperlipemia   . Hypertension   . Intermittent palpitations    once a month  . Paroxysmal SVT (supraventricular tachycardia) (HCC)    Myoview was performed 03/26/11: Low risk with small partially reversible apical perfusion defect likely  breast attenuation, no ischemia, no scar, EF 62%.   . Personal history of radiation therapy 2019  . Sleep apnea    CPAP   Past Surgical History:  Procedure Laterality Date  . ABDOMINAL HYSTERECTOMY    . BREAST BIOPSY Left 02/24/2017  . BREAST LUMPECTOMY Left 03/21/2017  . BREAST LUMPECTOMY WITH RADIOACTIVE SEED AND SENTINEL LYMPH NODE BIOPSY Left 03/21/2017   Procedure: LEFT BREAST LUMPECTOMY WITH RADIOACTIVE SEED AND LEFT AXILLARY DEEP LYMPH NODE BIOPSY INJECTION BLUE DYE LEFT BREAST ERAS PATHWAY;  Surgeon: Melody Skates, MD;  Location: [facility];  Service: General;  Laterality: Left;  . HERNIA REPAIR     umbilical hernia     Current Outpatient Medications  Medication Sig Dispense Refill  . acetaminophen (TYLENOL) 500 MG tablet Take 1 tablet by mouth as needed.    Marland Kitchen aspirin 81 MG chewable tablet Chew 1 tablet by mouth daily.    . calcium carbonate (TUMS - DOSED IN MG ELEMENTAL CALCIUM) 500 MG chewable tablet Chew 2 tablets by mouth 3 (three) times daily as needed for indigestion or heartburn.    . diclofenac sodium (VOLTAREN) 1 % GEL Apply 2-4 g topically 4 (four) times daily. 5 Tube 3  . diltiazem (CARDIZEM) 120 MG tablet TAKE 1 CAPSULE BY MOUTH ONCE DAILY IN THE EVENING. 90 tablet 1  . famotidine (PEPCID) 20 MG tablet Take 40 mg by mouth as needed. at night    . KLOR-CON M20 20 MEQ  tablet TAKE ONE TABLET BY MOUTH EVERY DAY 15 tablet 0  . letrozole (FEMARA) 2.5 MG tablet TAKE 1 TABLET (2.5 MG TOTAL) BY MOUTH DAILY. 90 tablet 3  . losartan-hydrochlorothiazide (HYZAAR) 100-25 MG tablet TAKE 1 TABLET BY MOUTH EVERY MORNING 90 tablet 1  . metFORMIN (GLUCOPHAGE-XR) 500 MG 24 hr tablet Take 1,000 mg by mouth daily.    . naproxen sodium (ALEVE) 220 MG tablet Take 1 tablet by mouth as needed.    . potassium chloride SA (KLOR-CON) 20 MEQ tablet TAKE 1 TABLET BY MOUTH ONCE DAILY IN THE MORNING. 90 tablet 1  . rosuvastatin (CRESTOR) 20 MG tablet TAKE 1 TABLET BY MOUTH ONCE DAILY. 90 tablet 1  .  Semaglutide, 1 MG/DOSE, 4 MG/3ML SOPN INJECT INTO THE SKIN ONCE WEEKLY AS DIRECTED 9 mL 1  . nitroGLYCERIN (NITROSTAT) 0.4 MG SL tablet Place 1 tablet (0.4 mg total) under the tongue every 5 (five) minutes x 3 doses as needed for chest pain. 25 tablet 3   No current facility-administered medications for this visit.    Allergies:   Dobutamine and Lisinopril   Social History:  The patient  reports that she has never smoked. She has never used smokeless tobacco. She reports that she does not drink alcohol and does not use drugs.   Family History:  The patient's family history includes Healthy in her mother; Heart attack (age of onset: 24) in her father; Liver cancer in her maternal uncle; Lymphoma in her maternal aunt; Multiple myeloma (age of onset: 81) in her mother; Pancreatic cancer in her cousin; Pancreatic cancer (age of onset: 39) in her maternal grandmother.   ROS:  Please see the history of present illness.   Otherwise, review of systems is positive for none.   All other systems are reviewed and negative.   PHYSICAL EXAM: VS:  BP 122/72   Pulse 84   Ht '5\' 7"'  (1.702 m)   Wt 270 lb 3.2 oz (122.6 kg)   SpO2 97%   BMI 42.32 kg/m  , BMI Body mass index is 42.32 kg/m. GEN: Well nourished, well developed, in no acute distress  HEENT: normal  Neck: no JVD, carotid bruits, or masses Cardiac: RRR; no murmurs, rubs, or gallops,no edema  Respiratory:  clear to auscultation bilaterally, normal work of breathing GI: soft, nontender, nondistended, + BS MS: no deformity or atrophy  Skin: warm and dry Neuro:  Strength and sensation are intact Psych: euthymic mood, full affect  EKG:  EKG is not ordered today. Personal review of the ekg ordered 03/20/20 shows sinus rhythm, rate 68  Recent Labs: No results found for requested labs within last 8760 hours.    Lipid Panel     Component Value Date/Time   CHOL 163 03/18/2011 0331   TRIG 114 03/18/2011 0331   HDL 49 03/18/2011 0331    CHOLHDL 3.3 03/18/2011 0331   VLDL 23 03/18/2011 0331   LDLCALC 91 03/18/2011 0331     Wt Readings from Last 3 Encounters:  05/08/20 270 lb 3.2 oz (122.6 kg)  03/20/20 273 lb 12.8 oz (124.2 kg)  03/10/19 281 lb (127.5 kg)      Other studies Reviewed: Additional studies/ records that were reviewed today include: TTE 09/12/2015 - Left ventricle: The cavity size was normal. There was mild  concentric hypertrophy. Systolic function was vigorous. The  estimated ejection fraction was in the range of 65% to 70%. Wall  motion was normal; there were no regional wall motion  abnormalities.  -  Mitral valve: There was trivial regurgitation.  - Left atrium: The atrium was mildly dilated.  - Right atrium: The atrium was mildly dilated.  - Pulmonary arteries: Systolic pressure was mildly increased. PA  peak pressure: 36 mm Hg (S).   Myoview 08/22/2015  Nuclear stress EF: 57%.  There was no ST segment deviation noted during stress.  The study is normal.  This is a low risk study.  The left ventricular ejection fraction is normal (55-65%).   Cardiac monitor 04/14/2020 personally reviewed Max 167 bpm 01:11pm, 03/09 Min 54 bpm 02:22am, 02/25 Avg 83 bpm <1% ventricular and supraventricular ectopy The predominant underlying rhythm was sinus rhythm 3 SVT episodes, longest and fastest 12 beats at 167 BPM Symptoms of fluttering, SOB, and triggered events associated with sinus rhythm and sinus tachycardia    ASSESSMENT AND PLAN:  1.  SVT: Had 3 episodes of up to 12 beats on her cardiac monitor.  She had palpitations while she was in sinus rhythm.  At this point, we Onya Eutsler continue her diltiazem.  We Tony Friscia see her back on as-needed basis.    Current medicines are reviewed at length with the patient today.   The patient does not have concerns regarding her medicines.  The following changes were made today: None  Labs/ tests ordered today include:  No orders of the defined types  were placed in this encounter.    Disposition:   FU with Aleigh Grunden as needed  Signed, Arah Aro Meredith Leeds, MD  05/08/2020 3:40 PM     [place]  [address] [phone] (office) [phone] (fax)

## 2020-05-08 NOTE — Patient Instructions (Signed)
Medication Instructions:  Your physician recommends that you continue on your current medications as directed. Please refer to the Current Medication list given to you today.  *If you need a refill on your cardiac medications before your next appointment, please call your pharmacy*   Lab Work: None ordered   Testing/Procedures: None ordered   Follow-Up: At [facility], you and your health needs are our priority.  As part of our continuing mission to provide you with exceptional heart care, we have created designated Provider Care Teams.  These Care Teams include your primary Cardiologist (physician) and Advanced Practice Providers (APPs -  Physician Assistants and Nurse Practitioners) who all work together to provide you with the care you need, when you need it.  Your next appointment:   as  needed  The format for your next appointment:   In Person  Provider:   Will Camnitz, MD    Thank you for choosing [facility]!!   Aviva Wolfer, RN [phone]        

## 2020-05-18 ENCOUNTER — Other Ambulatory Visit (HOSPITAL_COMMUNITY): Payer: Self-pay

## 2020-05-18 MED FILL — Losartan Potassium & Hydrochlorothiazide Tab 100-25 MG: ORAL | 30 days supply | Qty: 30 | Fill #0 | Status: AC

## 2020-05-19 ENCOUNTER — Other Ambulatory Visit (HOSPITAL_COMMUNITY): Payer: Self-pay

## 2020-05-19 MED ORDER — POTASSIUM CHLORIDE CRYS ER 20 MEQ PO TBCR
20.0000 meq | EXTENDED_RELEASE_TABLET | Freq: Every day | ORAL | 1 refills | Status: DC
  Filled 2020-05-19: qty 90, 90d supply, fill #0
  Filled 2020-09-12: qty 90, 90d supply, fill #1

## 2020-06-12 ENCOUNTER — Other Ambulatory Visit (HOSPITAL_COMMUNITY): Payer: Self-pay

## 2020-06-12 MED FILL — Diltiazem HCl Tab 120 MG: ORAL | 90 days supply | Qty: 90 | Fill #0 | Status: AC

## 2020-06-22 ENCOUNTER — Other Ambulatory Visit (HOSPITAL_COMMUNITY): Payer: Self-pay

## 2020-06-22 MED FILL — Rosuvastatin Calcium Tab 20 MG: ORAL | 90 days supply | Qty: 90 | Fill #0 | Status: AC

## 2020-06-22 MED FILL — Semaglutide Soln Pen-inj 1 MG/DOSE (4 MG/3ML): SUBCUTANEOUS | 84 days supply | Qty: 9 | Fill #0 | Status: AC

## 2020-06-22 MED FILL — Losartan Potassium & Hydrochlorothiazide Tab 100-25 MG: ORAL | 30 days supply | Qty: 30 | Fill #1 | Status: AC

## 2020-06-23 ENCOUNTER — Other Ambulatory Visit (HOSPITAL_COMMUNITY): Payer: Self-pay

## 2020-07-10 DIAGNOSIS — G4733 Obstructive sleep apnea (adult) (pediatric): Secondary | ICD-10-CM | POA: Diagnosis not present

## 2020-07-10 MED FILL — Letrozole Tab 2.5 MG: ORAL | 90 days supply | Qty: 90 | Fill #0 | Status: AC

## 2020-07-11 ENCOUNTER — Other Ambulatory Visit (HOSPITAL_COMMUNITY): Payer: Self-pay

## 2020-07-11 DIAGNOSIS — H0288B Meibomian gland dysfunction left eye, upper and lower eyelids: Secondary | ICD-10-CM | POA: Diagnosis not present

## 2020-07-11 DIAGNOSIS — H40013 Open angle with borderline findings, low risk, bilateral: Secondary | ICD-10-CM | POA: Diagnosis not present

## 2020-07-11 DIAGNOSIS — E119 Type 2 diabetes mellitus without complications: Secondary | ICD-10-CM | POA: Diagnosis not present

## 2020-07-11 DIAGNOSIS — H2513 Age-related nuclear cataract, bilateral: Secondary | ICD-10-CM | POA: Diagnosis not present

## 2020-07-11 DIAGNOSIS — H35033 Hypertensive retinopathy, bilateral: Secondary | ICD-10-CM | POA: Diagnosis not present

## 2020-07-11 DIAGNOSIS — H0288A Meibomian gland dysfunction right eye, upper and lower eyelids: Secondary | ICD-10-CM | POA: Diagnosis not present

## 2020-07-11 MED ORDER — CARESTART COVID-19 HOME TEST VI KIT
PACK | 0 refills | Status: DC
  Filled 2020-07-11: qty 4, 4d supply, fill #0

## 2020-08-03 ENCOUNTER — Other Ambulatory Visit (HOSPITAL_COMMUNITY): Payer: Self-pay

## 2020-08-03 MED FILL — Losartan Potassium & Hydrochlorothiazide Tab 100-25 MG: ORAL | 30 days supply | Qty: 30 | Fill #2 | Status: AC

## 2020-08-08 ENCOUNTER — Other Ambulatory Visit (HOSPITAL_COMMUNITY): Payer: Self-pay

## 2020-08-13 DIAGNOSIS — Z20822 Contact with and (suspected) exposure to covid-19: Secondary | ICD-10-CM | POA: Diagnosis not present

## 2020-08-15 MED FILL — DEXAMETHASONE 2 MG TABLET: 5 days supply | Qty: 4 | Fill #0

## 2020-08-15 MED FILL — HYDROXYUREA 500 MG CAPSULE: 5 days supply | Qty: 10 | Fill #0

## 2020-08-15 MED FILL — FOLIC ACID 1 MG TABLET: 5 days supply | Qty: 10 | Fill #0

## 2020-08-15 MED FILL — AZITHROMYCIN 250 MG TABLET: 5 days supply | Qty: 6 | Fill #0

## 2020-08-15 MED FILL — BENZONATATE 200 MG CAPSULE: 5 days supply | Qty: 15 | Fill #0

## 2020-09-01 DIAGNOSIS — G4733 Obstructive sleep apnea (adult) (pediatric): Secondary | ICD-10-CM | POA: Diagnosis not present

## 2020-09-08 ENCOUNTER — Other Ambulatory Visit: Payer: Self-pay

## 2020-09-08 ENCOUNTER — Ambulatory Visit
Admission: RE | Admit: 2020-09-08 | Discharge: 2020-09-08 | Disposition: A | Discharge status: Home / Self Care | Payer: 59 | Source: Ambulatory Visit | Attending: Adult Health | Admitting: Adult Health

## 2020-09-08 ENCOUNTER — Other Ambulatory Visit: Payer: Self-pay | Admitting: Adult Health

## 2020-09-08 DIAGNOSIS — R921 Mammographic calcification found on diagnostic imaging of breast: Secondary | ICD-10-CM

## 2020-09-08 DIAGNOSIS — R928 Other abnormal and inconclusive findings on diagnostic imaging of breast: Secondary | ICD-10-CM | POA: Diagnosis not present

## 2020-09-12 ENCOUNTER — Other Ambulatory Visit (HOSPITAL_COMMUNITY): Payer: Self-pay

## 2020-09-12 MED FILL — Losartan Potassium & Hydrochlorothiazide Tab 100-25 MG: ORAL | 30 days supply | Qty: 30 | Fill #3 | Status: CN

## 2020-09-12 MED FILL — Diltiazem HCl Tab 120 MG: ORAL | 90 days supply | Qty: 90 | Fill #1 | Status: AC

## 2020-09-13 ENCOUNTER — Other Ambulatory Visit (HOSPITAL_COMMUNITY): Payer: Self-pay

## 2020-09-13 MED ORDER — LOSARTAN POTASSIUM-HCTZ 100-25 MG PO TABS
0.5000 | ORAL_TABLET | Freq: Every morning | ORAL | 0 refills | Status: DC
  Filled 2020-09-13: qty 45, 90d supply, fill #0

## 2020-09-19 ENCOUNTER — Other Ambulatory Visit (HOSPITAL_COMMUNITY): Payer: Self-pay

## 2020-09-20 ENCOUNTER — Other Ambulatory Visit (HOSPITAL_COMMUNITY): Payer: Self-pay

## 2020-09-20 MED ORDER — OZEMPIC (1 MG/DOSE) 4 MG/3ML ~~LOC~~ SOPN
1.0000 mg | PEN_INJECTOR | SUBCUTANEOUS | 0 refills | Status: DC
  Filled 2020-09-20: qty 3, 28d supply, fill #0
  Filled 2020-10-04: qty 9, 84d supply, fill #0

## 2020-09-21 DIAGNOSIS — E1165 Type 2 diabetes mellitus with hyperglycemia: Secondary | ICD-10-CM | POA: Diagnosis not present

## 2020-09-21 DIAGNOSIS — Z170 Estrogen receptor positive status [ER+]: Secondary | ICD-10-CM | POA: Diagnosis not present

## 2020-09-21 DIAGNOSIS — E7800 Pure hypercholesterolemia, unspecified: Secondary | ICD-10-CM | POA: Diagnosis not present

## 2020-09-21 DIAGNOSIS — C50912 Malignant neoplasm of unspecified site of left female breast: Secondary | ICD-10-CM | POA: Diagnosis not present

## 2020-09-21 DIAGNOSIS — Z6841 Body Mass Index (BMI) 40.0 and over, adult: Secondary | ICD-10-CM | POA: Diagnosis not present

## 2020-09-21 DIAGNOSIS — I471 Supraventricular tachycardia: Secondary | ICD-10-CM | POA: Diagnosis not present

## 2020-09-21 DIAGNOSIS — I10 Essential (primary) hypertension: Secondary | ICD-10-CM | POA: Diagnosis not present

## 2020-09-21 DIAGNOSIS — G4733 Obstructive sleep apnea (adult) (pediatric): Secondary | ICD-10-CM | POA: Diagnosis not present

## 2020-09-22 ENCOUNTER — Other Ambulatory Visit (HOSPITAL_COMMUNITY): Payer: Self-pay

## 2020-09-22 MED ORDER — DILTIAZEM HCL 120 MG PO TABS
120.0000 mg | ORAL_TABLET | Freq: Every evening | ORAL | 1 refills | Status: DC
  Filled 2020-12-25: qty 90, 90d supply, fill #0

## 2020-09-22 MED ORDER — LOSARTAN POTASSIUM-HCTZ 100-25 MG PO TABS
0.5000 | ORAL_TABLET | Freq: Every morning | ORAL | 1 refills | Status: DC
  Filled 2020-11-01 – 2020-12-14 (×2): qty 45, 90d supply, fill #0

## 2020-09-22 MED ORDER — POTASSIUM CHLORIDE CRYS ER 20 MEQ PO TBCR
20.0000 meq | EXTENDED_RELEASE_TABLET | Freq: Every day | ORAL | 1 refills | Status: DC
  Filled 2020-09-22: qty 90, 90d supply, fill #0

## 2020-09-22 MED ORDER — OZEMPIC (1 MG/DOSE) 4 MG/3ML ~~LOC~~ SOPN
1.0000 mg | PEN_INJECTOR | SUBCUTANEOUS | 1 refills | Status: DC
  Filled 2020-09-22 – 2021-01-17 (×3): qty 3, 28d supply, fill #0
  Filled 2021-02-14: qty 3, 28d supply, fill #1
  Filled 2021-02-15: qty 9, 84d supply, fill #1

## 2020-09-22 MED ORDER — METFORMIN HCL ER 500 MG PO TB24
500.0000 mg | ORAL_TABLET | Freq: Two times a day (BID) | ORAL | 1 refills | Status: DC
  Filled 2020-09-22 – 2020-10-04 (×2): qty 180, 90d supply, fill #0

## 2020-09-28 ENCOUNTER — Other Ambulatory Visit (HOSPITAL_COMMUNITY): Payer: Self-pay

## 2020-09-29 ENCOUNTER — Other Ambulatory Visit (HOSPITAL_COMMUNITY): Payer: Self-pay

## 2020-10-04 ENCOUNTER — Other Ambulatory Visit (HOSPITAL_COMMUNITY): Payer: Self-pay

## 2020-10-06 DIAGNOSIS — D649 Anemia, unspecified: Secondary | ICD-10-CM | POA: Diagnosis not present

## 2020-10-06 DIAGNOSIS — E1165 Type 2 diabetes mellitus with hyperglycemia: Secondary | ICD-10-CM | POA: Diagnosis not present

## 2020-10-06 MED FILL — Letrozole Tab 2.5 MG: ORAL | 90 days supply | Qty: 90 | Fill #1 | Status: AC

## 2020-10-09 ENCOUNTER — Other Ambulatory Visit (HOSPITAL_COMMUNITY): Payer: Self-pay

## 2020-10-31 ENCOUNTER — Other Ambulatory Visit (HOSPITAL_COMMUNITY): Payer: Self-pay

## 2020-11-01 ENCOUNTER — Other Ambulatory Visit (HOSPITAL_COMMUNITY): Payer: Self-pay

## 2020-11-02 ENCOUNTER — Other Ambulatory Visit (HOSPITAL_COMMUNITY): Payer: Self-pay

## 2020-11-02 MED ORDER — ROSUVASTATIN CALCIUM 20 MG PO TABS
20.0000 mg | ORAL_TABLET | Freq: Every day | ORAL | 1 refills | Status: DC
  Filled 2020-11-02: qty 90, 90d supply, fill #0
  Filled 2020-12-25 – 2021-02-14 (×3): qty 90, 90d supply, fill #1

## 2020-11-03 ENCOUNTER — Other Ambulatory Visit (HOSPITAL_COMMUNITY): Payer: Self-pay

## 2020-11-07 ENCOUNTER — Other Ambulatory Visit (HOSPITAL_COMMUNITY): Payer: Self-pay

## 2020-11-07 MED FILL — Losartan Potassium & Hydrochlorothiazide Tab 100-25 MG: ORAL | 30 days supply | Qty: 30 | Fill #3 | Status: AC

## 2020-11-17 DIAGNOSIS — D509 Iron deficiency anemia, unspecified: Secondary | ICD-10-CM | POA: Diagnosis not present

## 2020-12-14 ENCOUNTER — Other Ambulatory Visit (HOSPITAL_COMMUNITY): Payer: Self-pay

## 2020-12-19 ENCOUNTER — Other Ambulatory Visit (HOSPITAL_COMMUNITY): Payer: Self-pay

## 2020-12-20 ENCOUNTER — Other Ambulatory Visit (HOSPITAL_COMMUNITY): Payer: Self-pay

## 2020-12-20 MED ORDER — LOSARTAN POTASSIUM-HCTZ 100-25 MG PO TABS
0.5000 | ORAL_TABLET | ORAL | 1 refills | Status: DC
  Filled 2020-12-20: qty 45, 90d supply, fill #0

## 2020-12-25 ENCOUNTER — Other Ambulatory Visit (HOSPITAL_COMMUNITY): Payer: Self-pay

## 2020-12-25 MED FILL — Letrozole Tab 2.5 MG: ORAL | 90 days supply | Qty: 90 | Fill #2 | Status: AC

## 2020-12-26 ENCOUNTER — Other Ambulatory Visit: Payer: Self-pay

## 2020-12-26 ENCOUNTER — Other Ambulatory Visit (HOSPITAL_COMMUNITY): Payer: Self-pay

## 2020-12-26 DIAGNOSIS — G4733 Obstructive sleep apnea (adult) (pediatric): Secondary | ICD-10-CM | POA: Diagnosis not present

## 2020-12-26 MED ORDER — POTASSIUM CHLORIDE CRYS ER 20 MEQ PO TBCR
20.0000 meq | EXTENDED_RELEASE_TABLET | Freq: Every day | ORAL | 1 refills | Status: DC
Start: 2020-12-26 — End: 2021-03-09
  Filled 2020-12-26: qty 90, 90d supply, fill #0
  Filled 2021-02-14: qty 90, 90d supply, fill #1

## 2020-12-26 MED ORDER — NITROGLYCERIN 0.4 MG SL SUBL: 0.4000 mg | SUBLINGUAL_TABLET | SUBLINGUAL | 4 refills | Status: AC | PRN

## 2020-12-26 MED ORDER — NITROGLYCERIN 0.4 MG SL SUBL
0.4000 mg | SUBLINGUAL_TABLET | SUBLINGUAL | 4 refills | Status: DC | PRN
Start: 2020-12-26 — End: 2020-12-26
  Filled 2020-12-26: qty 25, 1d supply, fill #0

## 2020-12-26 MED ORDER — OZEMPIC (1 MG/DOSE) 4 MG/3ML ~~LOC~~ SOPN
1.0000 mg | PEN_INJECTOR | SUBCUTANEOUS | 1 refills | Status: DC
  Filled 2020-12-26: qty 3, 28d supply, fill #0
  Filled 2021-02-14: qty 9, 84d supply, fill #0

## 2020-12-26 NOTE — Telephone Encounter (Signed)
Pt's medication was sent to pt's pharmacy as requested. Confirmation received.  °

## 2020-12-26 NOTE — Addendum Note (Signed)
Addended by: Carter Kitten D on: 12/26/2020 07:56 AM   Modules accepted: Orders

## 2020-12-27 ENCOUNTER — Other Ambulatory Visit (HOSPITAL_COMMUNITY): Payer: Self-pay

## 2020-12-29 ENCOUNTER — Other Ambulatory Visit (HOSPITAL_COMMUNITY): Payer: Self-pay

## 2020-12-29 MED ORDER — LOSARTAN POTASSIUM-HCTZ 100-25 MG PO TABS
1.0000 | ORAL_TABLET | Freq: Every morning | ORAL | 1 refills | Status: DC
  Filled 2020-12-29: qty 90, 90d supply, fill #0

## 2021-01-01 ENCOUNTER — Other Ambulatory Visit (HOSPITAL_COMMUNITY): Payer: Self-pay

## 2021-01-17 ENCOUNTER — Other Ambulatory Visit (HOSPITAL_COMMUNITY): Payer: Self-pay

## 2021-02-14 ENCOUNTER — Other Ambulatory Visit (HOSPITAL_COMMUNITY): Payer: Self-pay

## 2021-02-15 ENCOUNTER — Other Ambulatory Visit (HOSPITAL_COMMUNITY): Payer: Self-pay

## 2021-03-07 ENCOUNTER — Other Ambulatory Visit (HOSPITAL_COMMUNITY): Payer: Self-pay

## 2021-03-07 DIAGNOSIS — Z0000 Encounter for general adult medical examination without abnormal findings: Secondary | ICD-10-CM | POA: Diagnosis not present

## 2021-03-07 DIAGNOSIS — G4733 Obstructive sleep apnea (adult) (pediatric): Secondary | ICD-10-CM | POA: Diagnosis not present

## 2021-03-07 DIAGNOSIS — I10 Essential (primary) hypertension: Secondary | ICD-10-CM | POA: Diagnosis not present

## 2021-03-07 DIAGNOSIS — E1165 Type 2 diabetes mellitus with hyperglycemia: Secondary | ICD-10-CM | POA: Diagnosis not present

## 2021-03-07 DIAGNOSIS — Z1389 Encounter for screening for other disorder: Secondary | ICD-10-CM | POA: Diagnosis not present

## 2021-03-07 DIAGNOSIS — C50912 Malignant neoplasm of unspecified site of left female breast: Secondary | ICD-10-CM | POA: Diagnosis not present

## 2021-03-07 DIAGNOSIS — D649 Anemia, unspecified: Secondary | ICD-10-CM | POA: Diagnosis not present

## 2021-03-07 DIAGNOSIS — I471 Supraventricular tachycardia: Secondary | ICD-10-CM | POA: Diagnosis not present

## 2021-03-07 DIAGNOSIS — E7800 Pure hypercholesterolemia, unspecified: Secondary | ICD-10-CM | POA: Diagnosis not present

## 2021-03-07 MED ORDER — IBUPROFEN 800 MG PO TABS
800.0000 mg | ORAL_TABLET | Freq: Three times a day (TID) | ORAL | 1 refills | Status: DC | PRN
  Filled 2021-03-11: qty 90, 30d supply, fill #0
  Filled 2021-07-10: qty 90, 30d supply, fill #1

## 2021-03-07 MED ORDER — FREESTYLE LANCETS MISC
1 refills | Status: AC
  Filled 2021-11-19: qty 100, 50d supply, fill #0

## 2021-03-07 MED ORDER — ROSUVASTATIN CALCIUM 20 MG PO TABS
20.0000 mg | ORAL_TABLET | Freq: Every day | ORAL | 3 refills | Status: DC
  Filled 2021-05-08: qty 90, 90d supply, fill #0
  Filled 2021-07-10 – 2021-07-17 (×2): qty 90, 90d supply, fill #1
  Filled 2021-09-24 – 2021-11-19 (×2): qty 90, 90d supply, fill #2
  Filled 2022-02-26: qty 90, 90d supply, fill #3

## 2021-03-07 MED ORDER — LOSARTAN POTASSIUM-HCTZ 100-25 MG PO TABS
0.5000 | ORAL_TABLET | Freq: Every day | ORAL | 1 refills | Status: DC
  Filled 2021-05-08: qty 45, 90d supply, fill #0
  Filled 2021-07-10 – 2021-08-09 (×2): qty 45, 90d supply, fill #1

## 2021-03-07 MED ORDER — FREESTYLE LITE TEST VI STRP
ORAL_STRIP | 1 refills | Status: AC
  Filled 2021-03-11: qty 50, 50d supply, fill #0

## 2021-03-07 MED ORDER — POTASSIUM CHLORIDE CRYS ER 10 MEQ PO TBCR
10.0000 meq | EXTENDED_RELEASE_TABLET | Freq: Every day | ORAL | 1 refills | Status: DC
  Filled 2021-03-11: qty 90, 90d supply, fill #0
  Filled 2021-07-10: qty 90, 90d supply, fill #1

## 2021-03-07 MED ORDER — OZEMPIC (1 MG/DOSE) 4 MG/3ML ~~LOC~~ SOPN
1.0000 mg | PEN_INJECTOR | SUBCUTANEOUS | 2 refills | Status: DC
  Filled 2021-05-08: qty 9, 84d supply, fill #0
  Filled 2021-07-10: qty 9, 84d supply, fill #1

## 2021-03-07 MED ORDER — DILTIAZEM HCL 120 MG PO TABS
120.0000 mg | ORAL_TABLET | Freq: Every evening | ORAL | 1 refills | Status: DC
  Filled 2021-04-10: qty 90, 90d supply, fill #0
  Filled 2021-07-10: qty 90, 90d supply, fill #1

## 2021-03-08 NOTE — Assessment & Plan Note (Signed)
03/21/2017:Left lumpectomy: IDC grade 2, 1.5 cm, DCIS intermediate grade, margins negative, intraductal papilloma and fibrocystic changes, 0/3 lymph nodes negative, ER 100%, PR 20%, HER-2 negative, Ki-67 25%, T1CN0 stage I a Oncotype DX recurrence score 20: 6% risk of recurrence with hormone therapy  Adjuvant radiation therapy started 05/12/2017-06/06/2017 Current treatment:Letrozole 2.5 mg daily x5 to 7 yearsstarted 06/28/2017  Letrozoletoxicities:Denies any adverse effects to letrozole. Doing very well.  Breast cancer surveillance: 1.Breast exam2/10/2021: Benign 2.Mammogram1/28/2022: left lumpectomy bed calcs prob benign. 6 month follow up recommended. 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

## 2021-03-08 NOTE — Progress Notes (Signed)
Patient Care Team: Maury Dus, MD as PCP - General (Family Medicine) Kyung Rudd, MD as Consulting Physician (Radiation Oncology) Nicholas Lose, MD as Consulting Physician (Hematology and Oncology) Fanny Skates, MD as Consulting Physician (General Surgery) Delice Bison, Charlestine Massed, NP as Nurse Practitioner (Hematology and Oncology)  DIAGNOSIS:    ICD-10-CM   1. Malignant neoplasm of upper-inner quadrant of left breast in female, estrogen receptor positive ([place])  C50.212    Z17.0       SUMMARY OF ONCOLOGIC HISTORY: Oncology History  Malignant neoplasm of upper-inner quadrant of left breast in female, estrogen receptor positive ([place])  02/24/2017 Initial Diagnosis   Screening detected left breast mass UIQ 1130 position: 1.9 cm, ultrasound biopsy: IDC grade 2, DCIS, ER 100%, PR 20%, Ki-67 25%, HER-2 negative ratio 1.43, T1 cN0 stage I a AJCC 8   03/17/2017 Genetic Testing   Negative genetic testing on the 9-gene STAT panel.  The STAT Breast cancer panel offered by [facility] includes sequencing and rearrangement analysis for the following 9 genes:  ATM, BRCA1, BRCA2, CDH1, CHEK2, PALB2, PTEN, STK11 and TP53.   The report date is March 17, 2017.  Testing was reflexed to larger common hereditary cancer panel.    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  The Hereditary Gene Panel offered by [facility] includes sequencing and/or deletion duplication testing of the following 47 genes: APC, ATM, AXIN2, BARD1, BMPR1A, BRCA1, BRCA2, BRIP1, CDH1, CDK4, CDKN2A (p14ARF), CDKN2A (p16INK4a), CHEK2, CTNNA1, DICER1, EPCAM (Deletion/duplication testing only), GREM1 (promoter region deletion/duplication testing only), KIT, MEN1, MLH1, MSH2, MSH3, MSH6, MUTYH, NBN, NF1, NHTL1, PALB2, PDGFRA, PMS2, POLD1, POLE, PTEN, RAD50, RAD51C, RAD51D, SDHB, SDHC, SDHD, SMAD4, SMARCA4. STK11, TP53, TSC1, TSC2, and VHL.  The following genes were evaluated for sequence changes only: SDHA and HOXB13  c.251G>A variant only. The report date is March 19, 2017.    03/21/2017 Surgery   Left lumpectomy: IDC grade 2, 1.5 cm, DCIS intermediate grade, margins negative, intraductal papilloma and fibrocystic changes, 0/3 lymph nodes negative, ER 100%, PR 20%, HER-2 negative, Ki-67 25%, T1CN0 stage I a   04/10/2017 Oncotype testing   Oncotype DX recurrence score 20: 6% risk of recurrence with hormone therapy   05/12/2017 - 06/06/2017 Radiation Therapy   Adjuvant radiation therapy: Left breast treated to 42.5 Gy with 17 fx of 2.5 Gy followed by a boost of 7.5 Gy with 3 fx of 2.5 Gy   06/2017 -  Anti-estrogen oral therapy   Letrozole daily     CHIEF COMPLIANT: Follow-up of left breast cancer on letrozole therapy  INTERVAL HISTORY: Melody Zhang is a 66 y.o. with above-mentioned history of left breast cancer treated with lumpectomy, radiation, and who is currently on letrozole therapy. Mammogram on 09/08/2020 showed probably benign left breast calcifications at the lumpectomy bed recommended for short-term follow-up. She presents to the clinic today for follow-up.  She denies any side effects to antiestrogen therapy.  She does have occasional hot flashes.  Denies any lumps or nodules in the breast  ALLERGIES:  is allergic to dobutamine and lisinopril.  MEDICATIONS:  Current Outpatient Medications  Medication Sig Dispense Refill   acetaminophen (TYLENOL) 500 MG tablet Take 1 tablet by mouth as needed.     aspirin 81 MG chewable tablet Chew 1 tablet by mouth daily.     calcium carbonate (TUMS - DOSED IN MG ELEMENTAL CALCIUM) 500 MG chewable tablet Chew 2 tablets by mouth 3 (three) times daily as needed for indigestion or heartburn.  diclofenac sodium (VOLTAREN) 1 % GEL Apply 2-4 g topically 4 (four) times daily. 5 Tube 3   diltiazem (CARDIZEM) 120 MG tablet Take 1 tablet (120 mg total) by mouth every evening. 90 tablet 1   famotidine (PEPCID) 20 MG tablet Take 40 mg by mouth as needed. at night      glucose blood (FREESTYLE LITE) test strip For use when checking blood sugars daily, alternating mornings and evenings before meals 100 strip 1   ibuprofen (ADVIL) 800 MG tablet Take 1 tablet (800 mg total) by mouth with food or milk every 8 (eight) hours as needed. 90 tablet 1   KLOR-CON M20 20 MEQ tablet TAKE ONE TABLET BY MOUTH EVERY DAY 15 tablet 0   Lancets (FREESTYLE) lancets Use to check blood sugars daily alternating mornings and evenings before meals 100 each 1   letrozole (FEMARA) 2.5 MG tablet Take 1 tablet (2.5 mg total) by mouth daily. 90 tablet 3   losartan-hydrochlorothiazide (HYZAAR) 100-25 MG tablet Take 1/2 tablet by mouth daily. 45 tablet 1   naproxen sodium (ALEVE) 220 MG tablet Take 1 tablet by mouth as needed.     nitroGLYCERIN (NITROSTAT) 0.4 MG SL tablet Place 1 tablet (0.4 mg total) under the tongue every 5 (five) minutes x 3 doses as needed for chest pain. 25 tablet 4   potassium chloride (KLOR-CON M) 10 MEQ tablet Take 1 tablet (10 mEq total) by mouth daily. 90 tablet 1   rosuvastatin (CRESTOR) 20 MG tablet Take 1 tablet (20 mg total) by mouth daily. 90 tablet 3   Semaglutide, 1 MG/DOSE, (OZEMPIC, 1 MG/DOSE,) 4 MG/3ML SOPN Inject 1 mg into the skin once a week 9 mL 2   No current facility-administered medications for this visit.    PHYSICAL EXAMINATION: ECOG PERFORMANCE STATUS: 1 - Symptomatic but completely ambulatory  Vitals:   03/09/21 0828  BP: 132/78  Pulse: 83  Resp: 18  Temp: (!) 97.5 F (36.4 C)  SpO2: 99%   Filed Weights   03/09/21 0828  Weight: 252 lb 8 oz (114.5 kg)    BREAST: No palpable masses or nodules in either right or left breasts. No palpable axillary supraclavicular or infraclavicular adenopathy no breast tenderness or nipple discharge. (exam performed in the presence of a chaperone)  LABORATORY DATA:  I have reviewed the data as listed CMP Latest Ref Rng & Units 03/18/2017 03/05/2017 04/12/2011  Glucose 65 - 99 mg/dL 99 102 97  BUN  6 - 20 mg/dL _0 Creatinine 0.44 - 1.00 mg/dL 0.93 0.92 0.8  Sodium 135 - 145 mmol/L 140 140 139  Potassium 3.5 - 5.1 mmol/L 3.5 3.5 3.7  Chloride 101 - 111 mmol/L 102 102 102  CO2 22 - 32 mmol/L _1 Calcium 8.9 - 10.3 mg/dL 9.7 9.9 9.6  Total Protein 6.4 - 8.3 g/dL - 7.4 -  Total Bilirubin 0.2 - 1.2 mg/dL - 0.3 -  Alkaline Phos 40 - 150 U/L - 73 -  AST 5 - 34 U/L - 14 -  ALT 0 - 55 U/L - 13 -    Lab Results  Component Value Date   WBC 7.4 03/18/2017   HGB 12.0 03/18/2017   HCT 38.1 03/18/2017   MCV 81.8 03/18/2017   PLT 280 03/18/2017   NEUTROABS 7.0 (H) 03/05/2017    ASSESSMENT & PLAN:  Malignant neoplasm of upper-inner quadrant of left breast in female, estrogen receptor positive ([place]) 03/21/2017: Left lumpectomy: IDC grade 2,  1.5 cm, DCIS intermediate grade, margins negative, intraductal papilloma and fibrocystic changes, 0/3 lymph nodes negative, ER 100%, PR 20%, HER-2 negative, Ki-67 25%, T1CN0 stage I a Oncotype DX recurrence score 20: 6% risk of recurrence with hormone therapy   Adjuvant radiation therapy started 05/12/2017-06/06/2017 Current treatment: Letrozole 2.5 mg daily x5 to 7 years started 06/28/2017   Letrozole toxicities: Denies any adverse effects to letrozole. Doing very well.   Breast cancer surveillance: 1.  Breast exam 03/09/2021: Benign 2.  Mammogram 02/25/2020: left lumpectomy bed calcs prob benign. 6 month follow up recommended. 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  She stays extremely busy.  She was started on Ozympic   Return to clinic in 1 year for follow-up    No orders of the defined types were placed in this encounter.  The patient has a good understanding of the overall plan. she agrees with it. she will call with any problems that may develop before the next visit here.  Total time spent:  20 mins including face to face time and time spent for planning, charting and coordination of care  Rulon Eisenmenger, MD, MPH 03/09/2021  I, Thana Ates, am acting as scribe for Dr. Nicholas Lose.  I have reviewed the above documentation for accuracy and completeness, and I agree with the above.

## 2021-03-09 ENCOUNTER — Other Ambulatory Visit: Payer: Self-pay

## 2021-03-09 ENCOUNTER — Inpatient Hospital Stay: Payer: 59 | Attending: Hematology and Oncology | Admitting: Hematology and Oncology

## 2021-03-09 ENCOUNTER — Other Ambulatory Visit (HOSPITAL_COMMUNITY): Payer: Self-pay

## 2021-03-09 DIAGNOSIS — Z79899 Other long term (current) drug therapy: Secondary | ICD-10-CM | POA: Diagnosis not present

## 2021-03-09 DIAGNOSIS — Z170 Estrogen receptor positive status [ER+]: Secondary | ICD-10-CM | POA: Diagnosis not present

## 2021-03-09 DIAGNOSIS — C50212 Malignant neoplasm of upper-inner quadrant of left female breast: Secondary | ICD-10-CM | POA: Diagnosis present

## 2021-03-09 DIAGNOSIS — Z923 Personal history of irradiation: Secondary | ICD-10-CM | POA: Insufficient documentation

## 2021-03-09 DIAGNOSIS — Z79811 Long term (current) use of aromatase inhibitors: Secondary | ICD-10-CM | POA: Insufficient documentation

## 2021-03-09 MED ORDER — LETROZOLE 2.5 MG PO TABS
2.5000 mg | ORAL_TABLET | Freq: Every day | ORAL | 3 refills | Status: DC
  Filled 2021-03-09: qty 90, 90d supply, fill #0
  Filled 2021-07-10: qty 90, 90d supply, fill #1
  Filled 2021-09-24: qty 90, 90d supply, fill #2
  Filled 2022-01-26: qty 90, 90d supply, fill #3

## 2021-03-09 MED ORDER — POTASSIUM CHLORIDE CRYS ER 20 MEQ PO TBCR: 20.0000 meq | EXTENDED_RELEASE_TABLET | Freq: Every day | ORAL | 1 refills | Status: DC

## 2021-03-12 ENCOUNTER — Ambulatory Visit
Admission: RE | Admit: 2021-03-12 | Discharge: 2021-03-12 | Disposition: A | Discharge status: Home / Self Care | Payer: 59 | Source: Ambulatory Visit | Attending: Adult Health | Admitting: Adult Health

## 2021-03-12 ENCOUNTER — Other Ambulatory Visit (HOSPITAL_COMMUNITY): Payer: Self-pay

## 2021-03-12 DIAGNOSIS — R921 Mammographic calcification found on diagnostic imaging of breast: Secondary | ICD-10-CM

## 2021-03-26 DIAGNOSIS — G4733 Obstructive sleep apnea (adult) (pediatric): Secondary | ICD-10-CM | POA: Diagnosis not present

## 2021-04-10 ENCOUNTER — Other Ambulatory Visit (HOSPITAL_COMMUNITY): Payer: Self-pay

## 2021-05-08 ENCOUNTER — Other Ambulatory Visit (HOSPITAL_COMMUNITY): Payer: Self-pay

## 2021-06-15 DIAGNOSIS — L821 Other seborrheic keratosis: Secondary | ICD-10-CM | POA: Diagnosis not present

## 2021-07-10 ENCOUNTER — Other Ambulatory Visit (HOSPITAL_COMMUNITY): Payer: Self-pay

## 2021-07-11 ENCOUNTER — Other Ambulatory Visit (HOSPITAL_COMMUNITY): Payer: Self-pay

## 2021-07-11 DIAGNOSIS — G4733 Obstructive sleep apnea (adult) (pediatric): Secondary | ICD-10-CM | POA: Diagnosis not present

## 2021-07-11 DIAGNOSIS — G4719 Other hypersomnia: Secondary | ICD-10-CM | POA: Diagnosis not present

## 2021-07-16 ENCOUNTER — Other Ambulatory Visit (HOSPITAL_COMMUNITY): Payer: Self-pay

## 2021-07-17 ENCOUNTER — Other Ambulatory Visit (HOSPITAL_COMMUNITY): Payer: Self-pay

## 2021-07-19 ENCOUNTER — Other Ambulatory Visit (HOSPITAL_COMMUNITY): Payer: Self-pay

## 2021-08-10 ENCOUNTER — Other Ambulatory Visit (HOSPITAL_COMMUNITY): Payer: Self-pay

## 2021-08-27 DIAGNOSIS — G4733 Obstructive sleep apnea (adult) (pediatric): Secondary | ICD-10-CM | POA: Diagnosis not present

## 2021-09-18 ENCOUNTER — Other Ambulatory Visit (HOSPITAL_COMMUNITY): Payer: Self-pay

## 2021-09-18 DIAGNOSIS — N898 Other specified noninflammatory disorders of vagina: Secondary | ICD-10-CM | POA: Diagnosis not present

## 2021-09-18 DIAGNOSIS — Z170 Estrogen receptor positive status [ER+]: Secondary | ICD-10-CM | POA: Diagnosis not present

## 2021-09-18 DIAGNOSIS — G4733 Obstructive sleep apnea (adult) (pediatric): Secondary | ICD-10-CM | POA: Diagnosis not present

## 2021-09-18 DIAGNOSIS — E1165 Type 2 diabetes mellitus with hyperglycemia: Secondary | ICD-10-CM | POA: Diagnosis not present

## 2021-09-18 DIAGNOSIS — C50912 Malignant neoplasm of unspecified site of left female breast: Secondary | ICD-10-CM | POA: Diagnosis not present

## 2021-09-18 DIAGNOSIS — I10 Essential (primary) hypertension: Secondary | ICD-10-CM | POA: Diagnosis not present

## 2021-09-18 DIAGNOSIS — E7800 Pure hypercholesterolemia, unspecified: Secondary | ICD-10-CM | POA: Diagnosis not present

## 2021-09-18 DIAGNOSIS — I471 Supraventricular tachycardia: Secondary | ICD-10-CM | POA: Diagnosis not present

## 2021-09-18 MED ORDER — LOSARTAN POTASSIUM-HCTZ 100-25 MG PO TABS
0.5000 | ORAL_TABLET | Freq: Every day | ORAL | 1 refills | Status: DC
  Filled 2021-09-18 – 2021-11-19 (×2): qty 45, 90d supply, fill #0
  Filled 2022-02-26: qty 45, 90d supply, fill #1

## 2021-09-18 MED ORDER — DILTIAZEM HCL 120 MG PO TABS
120.0000 mg | ORAL_TABLET | Freq: Every evening | ORAL | 1 refills | Status: DC
  Filled 2021-09-18 – 2021-09-24 (×2): qty 90, 90d supply, fill #0
  Filled 2022-01-26: qty 90, 90d supply, fill #1

## 2021-09-18 MED ORDER — OZEMPIC (2 MG/DOSE) 8 MG/3ML ~~LOC~~ SOPN
2.0000 mg | PEN_INJECTOR | SUBCUTANEOUS | 1 refills | Status: DC
  Filled 2021-09-18: qty 9, 84d supply, fill #0
  Filled 2021-09-24: qty 3, 28d supply, fill #0
  Filled 2021-09-25: qty 9, 84d supply, fill #0
  Filled 2021-12-04: qty 3, 28d supply, fill #1
  Filled 2021-12-13: qty 9, 84d supply, fill #1

## 2021-09-18 MED ORDER — IBUPROFEN 800 MG PO TABS
800.0000 mg | ORAL_TABLET | Freq: Three times a day (TID) | ORAL | 1 refills | Status: AC | PRN
  Filled 2021-09-18 – 2021-09-24 (×2): qty 90, 30d supply, fill #0
  Filled 2022-02-26: qty 90, 30d supply, fill #1

## 2021-09-18 MED ORDER — POTASSIUM CHLORIDE CRYS ER 10 MEQ PO TBCR
10.0000 meq | EXTENDED_RELEASE_TABLET | Freq: Every day | ORAL | 1 refills | Status: DC
  Filled 2021-09-18 – 2021-12-04 (×2): qty 90, 90d supply, fill #0
  Filled 2022-03-05: qty 90, 90d supply, fill #1

## 2021-09-24 ENCOUNTER — Other Ambulatory Visit (HOSPITAL_COMMUNITY): Payer: Self-pay

## 2021-09-25 ENCOUNTER — Other Ambulatory Visit (HOSPITAL_COMMUNITY): Payer: Self-pay

## 2021-11-02 DIAGNOSIS — H524 Presbyopia: Secondary | ICD-10-CM | POA: Diagnosis not present

## 2021-11-02 DIAGNOSIS — H52221 Regular astigmatism, right eye: Secondary | ICD-10-CM | POA: Diagnosis not present

## 2021-11-02 DIAGNOSIS — H5203 Hypermetropia, bilateral: Secondary | ICD-10-CM | POA: Diagnosis not present

## 2021-11-06 DIAGNOSIS — G4733 Obstructive sleep apnea (adult) (pediatric): Secondary | ICD-10-CM | POA: Diagnosis not present

## 2021-11-19 ENCOUNTER — Other Ambulatory Visit (HOSPITAL_COMMUNITY): Payer: Self-pay

## 2021-12-04 ENCOUNTER — Other Ambulatory Visit (HOSPITAL_COMMUNITY): Payer: Self-pay

## 2021-12-05 ENCOUNTER — Other Ambulatory Visit (HOSPITAL_COMMUNITY): Payer: Self-pay

## 2021-12-06 ENCOUNTER — Other Ambulatory Visit (HOSPITAL_COMMUNITY): Payer: Self-pay

## 2021-12-07 ENCOUNTER — Other Ambulatory Visit (HOSPITAL_COMMUNITY): Payer: Self-pay

## 2021-12-12 ENCOUNTER — Other Ambulatory Visit (HOSPITAL_COMMUNITY): Payer: Self-pay

## 2021-12-13 ENCOUNTER — Other Ambulatory Visit (HOSPITAL_COMMUNITY): Payer: Self-pay

## 2022-01-31 ENCOUNTER — Other Ambulatory Visit: Payer: Self-pay | Admitting: Adult Health

## 2022-01-31 DIAGNOSIS — Z9889 Other specified postprocedural states: Secondary | ICD-10-CM

## 2022-03-05 ENCOUNTER — Other Ambulatory Visit (HOSPITAL_COMMUNITY): Payer: Self-pay

## 2022-03-06 ENCOUNTER — Other Ambulatory Visit: Payer: Self-pay

## 2022-03-08 ENCOUNTER — Other Ambulatory Visit (HOSPITAL_COMMUNITY): Payer: Self-pay

## 2022-03-08 DIAGNOSIS — G4733 Obstructive sleep apnea (adult) (pediatric): Secondary | ICD-10-CM | POA: Diagnosis not present

## 2022-03-08 NOTE — Progress Notes (Signed)
Patient Care Team: Elias Else, MD (Inactive) as PCP - General (Family Medicine) Dorothy Puffer, MD as Consulting Physician (Radiation Oncology) Serena Croissant, MD as Consulting Physician (Hematology and Oncology) Claud Kelp, MD as Consulting Physician (General Surgery) Axel Filler Larna Daughters, NP as Nurse Practitioner (Hematology and Oncology)  DIAGNOSIS:  Encounter Diagnosis  Name Primary?   Malignant neoplasm of upper-inner quadrant of left breast in female, estrogen receptor positive ([facility]) Yes    SUMMARY OF ONCOLOGIC HISTORY: Oncology History  Malignant neoplasm of upper-inner quadrant of left breast in female, estrogen receptor positive ([facility])  02/24/2017 Initial Diagnosis   Screening detected left breast mass UIQ 1130 position: 1.9 cm, ultrasound biopsy: IDC grade 2, DCIS, ER 100%, PR 20%, Ki-67 25%, HER-2 negative ratio 1.43, T1 cN0 stage I a AJCC 8   03/17/2017 Genetic Testing   Negative genetic testing on the 9-gene STAT panel.  The STAT Breast cancer panel offered by [facility] includes sequencing and rearrangement analysis for the following 9 genes:  ATM, BRCA1, BRCA2, CDH1, CHEK2, PALB2, PTEN, STK11 and TP53.   The report date is March 17, 2017.  Testing was reflexed to larger common hereditary cancer panel.    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  The Hereditary Gene Panel offered by [facility] includes sequencing and/or deletion duplication testing of the following 47 genes: APC, ATM, AXIN2, BARD1, BMPR1A, BRCA1, BRCA2, BRIP1, CDH1, CDK4, CDKN2A (p14ARF), CDKN2A (p16INK4a), CHEK2, CTNNA1, DICER1, EPCAM (Deletion/duplication testing only), GREM1 (promoter region deletion/duplication testing only), KIT, MEN1, MLH1, MSH2, MSH3, MSH6, MUTYH, NBN, NF1, NHTL1, PALB2, PDGFRA, PMS2, POLD1, POLE, PTEN, RAD50, RAD51C, RAD51D, SDHB, SDHC, SDHD, SMAD4, SMARCA4. STK11, TP53, TSC1, TSC2, and VHL.  The following genes were evaluated for sequence changes only:  SDHA and HOXB13 c.251G>A variant only. The report date is March 19, 2017.    03/21/2017 Surgery   Left lumpectomy: IDC grade 2, 1.5 cm, DCIS intermediate grade, margins negative, intraductal papilloma and fibrocystic changes, 0/3 lymph nodes negative, ER 100%, PR 20%, HER-2 negative, Ki-67 25%, T1CN0 stage I a   04/10/2017 Oncotype testing   Oncotype DX recurrence score 20: 6% risk of recurrence with hormone therapy   05/12/2017 - 06/06/2017 Radiation Therapy   Adjuvant radiation therapy: Left breast treated to 42.5 Gy with 17 fx of 2.5 Gy followed by a boost of 7.5 Gy with 3 fx of 2.5 Gy   06/2017 -  Anti-estrogen oral therapy   Letrozole daily     CHIEF COMPLIANT: Follow-up of left breast cancer on letrozole therapy   INTERVAL HISTORY: Melody Zhang is a 67 y.o. with above-mentioned history of left breast cancer treated with lumpectomy, radiation, and who is currently on letrozole therapy. She reports that she is tolerating the letrozole extremely well with no side effects or complaints. She says she does have some mild hot flashes. She says she has been doing good on her weight loss journey. She works a lot so she doesn't get time for exercise.  ALLERGIES:  is allergic to dobutamine and lisinopril.  MEDICATIONS:  Current Outpatient Medications  Medication Sig Dispense Refill   acetaminophen (TYLENOL) 500 MG tablet Take 1 tablet by mouth as needed.     aspirin 81 MG chewable tablet Chew 1 tablet by mouth daily.     diltiazem (CARDIZEM) 120 MG tablet Take 1 tablet (120 mg total) by mouth every evening. 90 tablet 1   famotidine (PEPCID) 20 MG tablet Take 40 mg by mouth as needed. at night  glucose blood (FREESTYLE LITE) test strip For use when checking blood sugars daily, alternating mornings and evenings before meals 100 strip 1   ibuprofen (ADVIL) 800 MG tablet Take 1 tablet (800 mg total) by mouth every 8 (eight) hours as needed with food or milk 90 tablet 1   KLOR-CON M20 20  MEQ tablet TAKE ONE TABLET BY MOUTH EVERY DAY 15 tablet 0   Lancets (FREESTYLE) lancets Use as directed to check blood sugars daily alternating mornings and evenings before meals 100 each 1   losartan-hydrochlorothiazide (HYZAAR) 100-25 MG tablet Take 0.5 tablets by mouth daily. 45 tablet 1   naproxen sodium (ALEVE) 220 MG tablet Take 1 tablet by mouth as needed.     nitroGLYCERIN (NITROSTAT) 0.4 MG SL tablet Place 1 tablet (0.4 mg total) under the tongue every 5 (five) minutes x 3 doses as needed for chest pain. 25 tablet 4   potassium chloride (KLOR-CON M) 10 MEQ tablet Take 1 tablet (10 mEq total) by mouth daily. 90 tablet 1   rosuvastatin (CRESTOR) 20 MG tablet Take 1 tablet (20 mg total) by mouth daily. 90 tablet 3   Semaglutide, 1 MG/DOSE, (OZEMPIC, 1 MG/DOSE,) 4 MG/3ML SOPN Inject 1 mg into the skin once a week 9 mL 2   Semaglutide, 2 MG/DOSE, (OZEMPIC, 2 MG/DOSE,) 8 MG/3ML SOPN Inject 2 mg into the skin once a week. 9 mL 1   letrozole (FEMARA) 2.5 MG tablet Take 1 tablet (2.5 mg total) by mouth daily. 90 tablet 3   No current facility-administered medications for this visit.    PHYSICAL EXAMINATION: ECOG PERFORMANCE STATUS: 1 - Symptomatic but completely ambulatory  Vitals:   03/11/22 0808  BP: 123/77  Pulse: 79  Resp: 18  Temp: (!) 97.3 F (36.3 C)  SpO2: 98%   Filed Weights   03/11/22 0808  Weight: 257 lb 9 oz (116.8 kg)    BREAST: No palpable masses or nodules in either right or left breasts. No palpable axillary supraclavicular or infraclavicular adenopathy no breast tenderness or nipple discharge. (exam performed in the presence of a chaperone)  LABORATORY DATA:  I have reviewed the data as listed    Latest Ref Rng & Units 03/18/2017    2:11 PM 03/05/2017   12:06 PM 04/12/2011   11:20 AM  CMP  Glucose 65 - 99 mg/dL 99  657  97   BUN 6 - 20 mg/dL 13  22  18    Creatinine 0.44 - 1.00 mg/dL 8.46  9.62  0.8   Sodium 135 - 145 mmol/L 140  140  139   Potassium 3.5 - 5.1  mmol/L 3.5  3.5  3.7   Chloride 101 - 111 mmol/L 102  102  102   CO2 22 - 32 mmol/L 26  26  29    Calcium 8.9 - 10.3 mg/dL 9.7  9.9  9.6   Total Protein 6.4 - 8.3 g/dL  7.4    Total Bilirubin 0.2 - 1.2 mg/dL  0.3    Alkaline Phos 40 - 150 U/L  73    AST 5 - 34 U/L  14    ALT 0 - 55 U/L  13      Lab Results  Component Value Date   WBC 7.4 03/18/2017   HGB 12.0 03/18/2017   HCT 38.1 03/18/2017   MCV 81.8 03/18/2017   PLT 280 03/18/2017   NEUTROABS 7.0 (H) 03/05/2017    ASSESSMENT & PLAN:  Malignant neoplasm of upper-inner quadrant of  left breast in female, estrogen receptor positive ([facility]) 03/21/2017: Left lumpectomy: IDC grade 2, 1.5 cm, DCIS intermediate grade, margins negative, intraductal papilloma and fibrocystic changes, 0/3 lymph nodes negative, ER 100%, PR 20%, HER-2 negative, Ki-67 25%, T1CN0 stage I a Oncotype DX recurrence score 20: 6% risk of recurrence with hormone therapy   Adjuvant radiation therapy started 05/12/2017-06/06/2017 Current treatment: Letrozole 2.5 mg daily x 7 years started 06/28/2017   Letrozole toxicities: Denies any adverse effects to letrozole. Doing very well.   Breast cancer surveillance: 1.  Breast exam 03/11/2022: Benign 2.  Mammogram 03/12/21: Benign fat necrosis. 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  She also works at [employer] as well as [employer].  She stays extremely busy. She has been on Ozempic and lost significant weight     Return to clinic in 1 year for follow-up    No orders of the defined types were placed in this encounter.  The patient has a good understanding of the overall plan. she agrees with it. she will call with any problems that may develop before the next visit here. Total time spent: 30 mins including face to face time and time spent for planning, charting and co-ordination of care   Tamsen Meek, MD 03/11/22    I Janan Ridge am acting as a  Neurosurgeon for [employer]  I have reviewed the above documentation for accuracy and completeness, and I agree with the above.

## 2022-03-09 NOTE — Assessment & Plan Note (Signed)
03/21/2017: Left lumpectomy: IDC grade 2, 1.5 cm, DCIS intermediate grade, margins negative, intraductal papilloma and fibrocystic changes, 0/3 lymph nodes negative, ER 100%, PR 20%, HER-2 negative, Ki-67 25%, T1CN0 stage I a Oncotype DX recurrence score 20: 6% risk of recurrence with hormone therapy   Adjuvant radiation therapy started 05/12/2017-06/06/2017 Current treatment: Letrozole 2.5 mg daily x5 to 7 years started 06/28/2017   Letrozole toxicities: Denies any adverse effects to letrozole. Doing very well.   Breast cancer surveillance: 1.  Breast exam 03/11/2022: Benign 2.  Mammogram 03/12/21: Benign fat necrosis. 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  She also works at [employer] as well as [employer].  She stays extremely busy.  She was started on Ozympic   Return to clinic in 1 year for follow-up

## 2022-03-11 ENCOUNTER — Other Ambulatory Visit (HOSPITAL_COMMUNITY): Payer: Self-pay

## 2022-03-11 ENCOUNTER — Inpatient Hospital Stay: Payer: 59 | Attending: Hematology and Oncology | Admitting: Hematology and Oncology

## 2022-03-11 ENCOUNTER — Other Ambulatory Visit: Payer: Self-pay

## 2022-03-11 VITALS — BP 123/77 | HR 79 | Temp 97.3°F | Resp 18 | Wt 257.6 lb

## 2022-03-11 DIAGNOSIS — Z79811 Long term (current) use of aromatase inhibitors: Secondary | ICD-10-CM | POA: Diagnosis not present

## 2022-03-11 DIAGNOSIS — C50212 Malignant neoplasm of upper-inner quadrant of left female breast: Secondary | ICD-10-CM | POA: Diagnosis not present

## 2022-03-11 DIAGNOSIS — Z170 Estrogen receptor positive status [ER+]: Secondary | ICD-10-CM | POA: Insufficient documentation

## 2022-03-11 MED ORDER — LETROZOLE 2.5 MG PO TABS
2.5000 mg | ORAL_TABLET | Freq: Every day | ORAL | 3 refills | Status: DC
  Filled 2022-03-11 – 2022-05-14 (×2): qty 90, 90d supply, fill #0
  Filled 2022-08-21: qty 90, 90d supply, fill #1
  Filled 2022-11-19: qty 90, 90d supply, fill #2

## 2022-03-12 ENCOUNTER — Telehealth: Payer: Self-pay | Admitting: Hematology and Oncology

## 2022-03-12 NOTE — Telephone Encounter (Signed)
Scheduled appointment per 2/12 los. Patient is aware.

## 2022-03-14 ENCOUNTER — Other Ambulatory Visit (HOSPITAL_COMMUNITY): Payer: Self-pay

## 2022-03-14 ENCOUNTER — Other Ambulatory Visit: Payer: Self-pay | Admitting: Adult Health

## 2022-03-14 ENCOUNTER — Ambulatory Visit
Admission: RE | Admit: 2022-03-14 | Discharge: 2022-03-14 | Disposition: A | Discharge status: Home / Self Care | Payer: 59 | Source: Ambulatory Visit | Attending: Adult Health | Admitting: Adult Health

## 2022-03-14 DIAGNOSIS — Z9889 Other specified postprocedural states: Secondary | ICD-10-CM

## 2022-03-14 DIAGNOSIS — Z1231 Encounter for screening mammogram for malignant neoplasm of breast: Secondary | ICD-10-CM | POA: Diagnosis not present

## 2022-03-14 MED ORDER — OZEMPIC (2 MG/DOSE) 8 MG/3ML ~~LOC~~ SOPN
2.0000 mg | PEN_INJECTOR | SUBCUTANEOUS | 1 refills | Status: DC
  Filled 2022-03-14: qty 3, 28d supply, fill #0
  Filled 2022-04-16 – 2022-04-19 (×2): qty 3, 28d supply, fill #1
  Filled 2022-10-28: qty 9, 84d supply, fill #2

## 2022-03-18 ENCOUNTER — Other Ambulatory Visit (HOSPITAL_COMMUNITY): Payer: Self-pay

## 2022-04-06 DIAGNOSIS — G4733 Obstructive sleep apnea (adult) (pediatric): Secondary | ICD-10-CM | POA: Diagnosis not present

## 2022-04-19 ENCOUNTER — Other Ambulatory Visit (HOSPITAL_COMMUNITY): Payer: Self-pay

## 2022-04-22 ENCOUNTER — Other Ambulatory Visit (HOSPITAL_COMMUNITY): Payer: Self-pay

## 2022-04-22 ENCOUNTER — Other Ambulatory Visit: Payer: Self-pay | Admitting: Family Medicine

## 2022-04-22 DIAGNOSIS — E7800 Pure hypercholesterolemia, unspecified: Secondary | ICD-10-CM | POA: Diagnosis not present

## 2022-04-22 DIAGNOSIS — E1165 Type 2 diabetes mellitus with hyperglycemia: Secondary | ICD-10-CM | POA: Diagnosis not present

## 2022-04-22 DIAGNOSIS — D649 Anemia, unspecified: Secondary | ICD-10-CM | POA: Diagnosis not present

## 2022-04-22 DIAGNOSIS — I10 Essential (primary) hypertension: Secondary | ICD-10-CM | POA: Diagnosis not present

## 2022-04-22 DIAGNOSIS — E2839 Other primary ovarian failure: Secondary | ICD-10-CM

## 2022-04-22 MED ORDER — POTASSIUM CHLORIDE CRYS ER 10 MEQ PO TBCR
10.0000 meq | EXTENDED_RELEASE_TABLET | Freq: Every day | ORAL | 1 refills | Status: AC
  Filled 2022-04-22 – 2022-06-15 (×2): qty 90, 90d supply, fill #0
  Filled 2022-08-21 – 2022-09-22 (×2): qty 90, 90d supply, fill #1
  Filled 2023-01-05 – 2023-04-07 (×2): qty 90, 90d supply, fill #2

## 2022-04-22 MED ORDER — LOSARTAN POTASSIUM-HCTZ 100-25 MG PO TABS
0.5000 | ORAL_TABLET | Freq: Every day | ORAL | 1 refills | Status: AC
  Filled 2022-04-22: qty 50, 50d supply, fill #0
  Filled 2022-05-14: qty 45, 90d supply, fill #0
  Filled 2022-08-21: qty 45, 90d supply, fill #1
  Filled 2022-11-19 – 2023-01-05 (×2): qty 45, 90d supply, fill #2

## 2022-04-22 MED ORDER — IBUPROFEN 800 MG PO TABS
800.0000 mg | ORAL_TABLET | Freq: Three times a day (TID) | ORAL | 1 refills | Status: AC
  Filled 2022-04-22 – 2022-11-19 (×2): qty 100, 34d supply, fill #0

## 2022-04-22 MED ORDER — DILTIAZEM HCL 120 MG PO TABS
120.0000 mg | ORAL_TABLET | Freq: Every evening | ORAL | 1 refills | Status: DC
  Filled 2022-04-22 – 2022-05-14 (×2): qty 90, 90d supply, fill #0
  Filled 2022-08-21: qty 90, 90d supply, fill #1
  Filled 2022-11-19: qty 90, 90d supply, fill #2

## 2022-04-22 MED ORDER — OZEMPIC (2 MG/DOSE) 8 MG/3ML ~~LOC~~ SOPN
2.0000 mg | PEN_INJECTOR | SUBCUTANEOUS | 1 refills | Status: DC
  Filled 2022-04-22: qty 9, 90d supply, fill #0
  Filled 2022-05-13: qty 9, 84d supply, fill #0
  Filled 2022-08-04: qty 9, 84d supply, fill #1

## 2022-04-22 MED ORDER — ROSUVASTATIN CALCIUM 20 MG PO TABS
20.0000 mg | ORAL_TABLET | Freq: Every day | ORAL | 3 refills | Status: AC
  Filled 2022-04-22: qty 100, 100d supply, fill #0
  Filled 2022-06-15: qty 90, 90d supply, fill #0
  Filled 2022-09-22: qty 90, 90d supply, fill #1

## 2022-05-07 DIAGNOSIS — G4733 Obstructive sleep apnea (adult) (pediatric): Secondary | ICD-10-CM | POA: Diagnosis not present

## 2022-05-13 ENCOUNTER — Other Ambulatory Visit (HOSPITAL_COMMUNITY): Payer: Self-pay

## 2022-05-14 ENCOUNTER — Other Ambulatory Visit (HOSPITAL_COMMUNITY): Payer: Self-pay

## 2022-05-14 ENCOUNTER — Other Ambulatory Visit: Payer: Self-pay

## 2022-05-14 DIAGNOSIS — E782 Mixed hyperlipidemia: Secondary | ICD-10-CM | POA: Diagnosis not present

## 2022-05-14 DIAGNOSIS — Z1211 Encounter for screening for malignant neoplasm of colon: Secondary | ICD-10-CM | POA: Diagnosis not present

## 2022-05-14 DIAGNOSIS — K5904 Chronic idiopathic constipation: Secondary | ICD-10-CM | POA: Diagnosis not present

## 2022-05-14 DIAGNOSIS — K5730 Diverticulosis of large intestine without perforation or abscess without bleeding: Secondary | ICD-10-CM | POA: Diagnosis not present

## 2022-05-14 DIAGNOSIS — G4733 Obstructive sleep apnea (adult) (pediatric): Secondary | ICD-10-CM | POA: Diagnosis not present

## 2022-05-14 DIAGNOSIS — Z86010 Personal history of colonic polyps: Secondary | ICD-10-CM | POA: Diagnosis not present

## 2022-05-14 DIAGNOSIS — I10 Essential (primary) hypertension: Secondary | ICD-10-CM | POA: Diagnosis not present

## 2022-05-14 DIAGNOSIS — E119 Type 2 diabetes mellitus without complications: Secondary | ICD-10-CM | POA: Diagnosis not present

## 2022-05-14 DIAGNOSIS — K219 Gastro-esophageal reflux disease without esophagitis: Secondary | ICD-10-CM | POA: Diagnosis not present

## 2022-05-21 ENCOUNTER — Encounter: Payer: Self-pay | Admitting: Dermatology

## 2022-05-21 ENCOUNTER — Ambulatory Visit (INDEPENDENT_AMBULATORY_CARE_PROVIDER_SITE_OTHER): Payer: 59 | Admitting: Dermatology

## 2022-05-21 DIAGNOSIS — L820 Inflamed seborrheic keratosis: Secondary | ICD-10-CM | POA: Diagnosis not present

## 2022-05-21 DIAGNOSIS — B079 Viral wart, unspecified: Secondary | ICD-10-CM

## 2022-05-21 DIAGNOSIS — L821 Other seborrheic keratosis: Secondary | ICD-10-CM

## 2022-05-21 NOTE — Patient Instructions (Signed)
Cryotherapy Aftercare  Wash gently with soap and water everyday.   Apply Vaseline and Band-Aid daily until healed.   Due to recent changes in healthcare laws, you may see results of your pathology and/or laboratory studies on MyChart before the doctors have had a chance to review them. We understand that in some cases there may be results that are confusing or concerning to you. Please understand that not all results are received at the same time and often the doctors may need to interpret multiple results in order to provide you with the best plan of care or course of treatment. Therefore, we ask that you please give us 2 business days to thoroughly review all your results before contacting the office for clarification. Should we see a critical lab result, you will be contacted sooner.   If You Need Anything After Your Visit  If you have any questions or concerns for your doctor, please call our main line at [phone] If no one answers, please leave a voicemail as directed and we will return your call as soon as possible. Messages left after 4 pm will be answered the following business day.   You may also send us a message via MyChart. We typically respond to MyChart messages within 1-2 business days.  For prescription refills, please ask your pharmacy to contact our office. Our fax number is [phone].  If you have an urgent issue when the clinic is closed that cannot wait until the next business day, you can page your doctor at the number below.    Please note that while we do our best to be available for urgent issues outside of office hours, we are not available 24/7.   If you have an urgent issue and are unable to reach us, you may choose to seek medical care at your doctor's office, retail clinic, urgent care center, or emergency room.  If you have a medical emergency, please immediately call 911 or go to the emergency department. In the event of inclement weather, please call our  main line at [phone] for an update on the status of any delays or closures.  Dermatology Medication Tips: Please keep the boxes that topical medications come in in order to help keep track of the instructions about where and how to use these. Pharmacies typically print the medication instructions only on the boxes and not directly on the medication tubes.   If your medication is too expensive, please contact our office at [phone] or send us a message through MyChart.   We are unable to tell what your co-pay for medications will be in advance as this is different depending on your insurance coverage. However, we may be able to find a substitute medication at lower cost or fill out paperwork to get insurance to cover a needed medication.   If a prior authorization is required to get your medication covered by your insurance company, please allow us 1-2 business days to complete this process.  Drug prices often vary depending on where the prescription is filled and some pharmacies may offer cheaper prices.  The website www.goodrx.com contains coupons for medications through different pharmacies. The prices here do not account for what the cost may be with help from insurance (it may be cheaper with your insurance), but the website can give you the price if you did not use any insurance.  - You can print the associated coupon and take it with your prescription to the pharmacy.  - You may also   stop by our office during regular business hours and pick up a GoodRx coupon card.  - If you need your prescription sent electronically to a different pharmacy, notify our office through [place] MyChart or by phone at [phone]     

## 2022-05-21 NOTE — Progress Notes (Unsigned)
   New Patient Visit   Subjective  Melody Zhang is a 67 y.o. female who presents for the following: Nevi  Spot on left cheek, right rib line and right eyelid. The spot on the cheek gets itchy. No family hx of skin cancer.  The following portions of the chart were reviewed this encounter and updated as appropriate: medications, allergies, medical history  Review of Systems:  No other skin or systemic complaints except as noted in HPI or Assessment and Plan.  Objective  Well appearing patient in no apparent distress; mood and affect are within normal limits.  A focused examination was performed of the following areas: Face and right rib line  Relevant exam findings are noted in the Assessment and Plan.  Right Zygomatic Area Verrucous papules   Left Parotid Area, Right Flank Irritated stuck-on, waxy, tan-brown papules         Assessment & Plan     Viral warts, unspecified type Right Zygomatic Area  Destruction of lesion - Right Zygomatic Area Complexity: simple   Destruction method: cryotherapy   Informed consent: discussed and consent obtained   Timeout:  patient name, date of birth, surgical site, and procedure verified Lesion destroyed using liquid nitrogen: Yes   Region frozen until ice ball extended beyond lesion: Yes   Outcome: patient tolerated procedure well with no complications   Post-procedure details: wound care instructions given    Seborrheic keratosis, inflamed (2) Right Flank; Left Parotid Area  Destruction of lesion - Left Parotid Area, Right Flank Complexity: simple   Destruction method: cryotherapy   Informed consent: discussed and consent obtained   Timeout:  patient name, date of birth, surgical site, and procedure verified Lesion destroyed using liquid nitrogen: Yes   Region frozen until ice ball extended beyond lesion: Yes   Outcome: patient tolerated procedure well with no complications   Post-procedure details: wound care  instructions given    Dermatosis papulosa nigra   DERMATOSIS PAPULOSA NIGRA Exam: brown/black pedunculated and flat-topped papules on face/neck/eyelids  Treatment Plan: -Reassured pt lesions are benign  -If they obstruct her vision or become painful /irritated they can be removed, otherwise removal is considered cosmetic   Return if symptoms worsen or fail to improve.    Documentation: I have reviewed the above documentation for accuracy and completeness, and I agree with the above.  Melody Reusing, MD  I, Melody Zhang, CMA, am acting as scribe for Melody Reusing, MD.

## 2022-05-24 ENCOUNTER — Other Ambulatory Visit: Payer: Self-pay

## 2022-05-27 ENCOUNTER — Ambulatory Visit: Payer: 59 | Admitting: Podiatry

## 2022-06-03 ENCOUNTER — Ambulatory Visit (INDEPENDENT_AMBULATORY_CARE_PROVIDER_SITE_OTHER): Payer: 59

## 2022-06-03 ENCOUNTER — Ambulatory Visit (INDEPENDENT_AMBULATORY_CARE_PROVIDER_SITE_OTHER): Payer: 59 | Admitting: Podiatry

## 2022-06-03 ENCOUNTER — Encounter: Payer: Self-pay | Admitting: Podiatry

## 2022-06-03 ENCOUNTER — Telehealth: Payer: Self-pay | Admitting: Urology

## 2022-06-03 DIAGNOSIS — M205X1 Other deformities of toe(s) (acquired), right foot: Secondary | ICD-10-CM | POA: Diagnosis not present

## 2022-06-03 DIAGNOSIS — R52 Pain, unspecified: Secondary | ICD-10-CM

## 2022-06-03 NOTE — Telephone Encounter (Signed)
DOS - 06/11/22  KELLER BUNION IMPLANT RIGHT --- [id number]  [employer]   PER [employer]'S AUTOMATED SYSTEM FOR CPT CODE 28291 NO PRIOR AUTH IS REQUIRED.  CALL REF # [id number]

## 2022-06-03 NOTE — Progress Notes (Signed)
Subjective:   Patient ID: Melody Zhang, female   DOB: 67 y.o.   MRN: [id number]   HPI Patient states that she has to get this big toe joint taking care of that has been very sore and been going on for a long time worse recently and also the ankle is getting very sore from walking differently.  Patient is trying to lose weight and as a result is trying to increase activity.  Patient does not smoke likes to be active   Review of Systems  All other systems reviewed and are negative.       Objective:  Physical Exam Vitals and nursing note reviewed.  Constitutional:      Appearance: She is well-developed.  Pulmonary:     Effort: Pulmonary effort is normal.  Musculoskeletal:        General: Normal range of motion.  Skin:    General: Skin is warm.  Neurological:     Mental Status: She is alert.     Neurovascular status intact muscle strength found to be adequate range of motion adequate severe loss of motion of the first MPJ right with pain fluid buildup around the joint surface and inflammation of the sinus tarsi right.  Good digital perfusion well-oriented     Assessment:  Significant arthritis of the first MPJ right with patient needs to get more active but cannot do it due to the pain present along with compensatory sinus tarsitis.  Diabetes under good control     Plan:  H&P reviewed x-rays and at this point I have recommended a implant arthroplasty due to the condition of the joint no motion and pain as I think this will heal faster than fusion.  I explained this to patient I reviewed the surgery allowed her to read consent form going over all possible alternative treatments complications and patient wants this fixed surgically scheduled for outpatient surgery with all questions answered today.  Also will do injection of the sinus tarsi at the time of surgery and air fracture walker dispensed fitted properly to her lower leg and she can wear this prior to surgery to settle  symptoms down  X-rays indicate significant spurring around the first metatarsal head right narrowing of the joint surface noted

## 2022-06-10 ENCOUNTER — Other Ambulatory Visit (HOSPITAL_COMMUNITY): Payer: Self-pay

## 2022-06-10 MED ORDER — OXYCODONE-ACETAMINOPHEN 10-325 MG PO TABS
1.0000 | ORAL_TABLET | ORAL | 0 refills | Status: DC | PRN
  Filled 2022-06-10: qty 20, 4d supply, fill #0

## 2022-06-10 MED ORDER — ONDANSETRON HCL 4 MG PO TABS
4.0000 mg | ORAL_TABLET | Freq: Three times a day (TID) | ORAL | 0 refills | Status: DC | PRN
  Filled 2022-06-10: qty 20, 7d supply, fill #0

## 2022-06-10 NOTE — Addendum Note (Signed)
Addended by: Lenn Sink on: 06/10/2022 02:27 PM   Modules accepted: Orders

## 2022-06-11 DIAGNOSIS — M205X1 Other deformities of toe(s) (acquired), right foot: Secondary | ICD-10-CM | POA: Diagnosis not present

## 2022-06-11 DIAGNOSIS — M7751 Other enthesopathy of right foot: Secondary | ICD-10-CM | POA: Diagnosis not present

## 2022-06-11 DIAGNOSIS — M2022 Hallux rigidus, left foot: Secondary | ICD-10-CM

## 2022-06-11 DIAGNOSIS — G4730 Sleep apnea, unspecified: Secondary | ICD-10-CM | POA: Diagnosis not present

## 2022-06-11 DIAGNOSIS — M2021 Hallux rigidus, right foot: Secondary | ICD-10-CM | POA: Diagnosis not present

## 2022-06-11 DIAGNOSIS — M19071 Primary osteoarthritis, right ankle and foot: Secondary | ICD-10-CM | POA: Diagnosis not present

## 2022-06-15 ENCOUNTER — Other Ambulatory Visit (HOSPITAL_COMMUNITY): Payer: Self-pay

## 2022-06-17 ENCOUNTER — Ambulatory Visit (INDEPENDENT_AMBULATORY_CARE_PROVIDER_SITE_OTHER): Payer: 59 | Admitting: Podiatry

## 2022-06-17 ENCOUNTER — Encounter: Payer: Self-pay | Admitting: Podiatry

## 2022-06-17 DIAGNOSIS — Z9889 Other specified postprocedural states: Secondary | ICD-10-CM

## 2022-06-17 NOTE — Progress Notes (Signed)
Subjective:   Patient ID: Melody Zhang, female   DOB: 67 y.o.   MRN: [id number]   HPI Patient states she is doing very well with surgery very pleased   ROS      Objective:  Physical Exam  Neurovascular status intact negative Denna Haggard' sign noted wound edges well coapted good range of motion no crepitus within the joint surface at all with significant improvement from preoperatively     Assessment:  Doing excellent post implant arthroplasty procedure first MPJ right foot     Plan:  H&P x-ray reviewed advised this patient on continued elevation compression and reapplied compression dressing and dispensed surgical shoe.  Reappoint in 3 weeks or earlier if needed and encouraged continued immobilization until that time  X-rays indicate that the implant is in excellent position with good alignment noted

## 2022-07-01 ENCOUNTER — Ambulatory Visit (INDEPENDENT_AMBULATORY_CARE_PROVIDER_SITE_OTHER): Payer: 59 | Admitting: Podiatry

## 2022-07-01 ENCOUNTER — Encounter: Payer: Self-pay | Admitting: Podiatry

## 2022-07-01 ENCOUNTER — Ambulatory Visit (INDEPENDENT_AMBULATORY_CARE_PROVIDER_SITE_OTHER): Payer: 59

## 2022-07-01 VITALS — Ht 67.0 in

## 2022-07-01 DIAGNOSIS — M205X1 Other deformities of toe(s) (acquired), right foot: Secondary | ICD-10-CM

## 2022-07-01 NOTE — Progress Notes (Signed)
Subjective:   Patient ID: Melody Zhang, female   DOB: 67 y.o.   MRN: [id number]   HPI Patient states doing well with surgery very pleased   ROS      Objective:  Physical Exam  Neuro vas status intact negative Denna Haggard' sign noted wound edges coapted well good range of motion no crepitus of the joint noted     Assessment:  Doing well post Keller implant right     Plan:  Reviewed final x-ray instructed on continued elevation compression dispensed ankle compression stocking begin shoe gear usage see back 6 weeks for final visit  X-rays indicate implant is in place no signs of pathology

## 2022-07-15 DIAGNOSIS — G4733 Obstructive sleep apnea (adult) (pediatric): Secondary | ICD-10-CM | POA: Diagnosis not present

## 2022-07-25 ENCOUNTER — Ambulatory Visit (INDEPENDENT_AMBULATORY_CARE_PROVIDER_SITE_OTHER): Payer: 59 | Admitting: Dermatology

## 2022-07-25 ENCOUNTER — Encounter: Payer: Self-pay | Admitting: Dermatology

## 2022-07-25 VITALS — BP 116/76

## 2022-07-25 DIAGNOSIS — L820 Inflamed seborrheic keratosis: Secondary | ICD-10-CM | POA: Diagnosis not present

## 2022-07-25 NOTE — Patient Instructions (Addendum)
Thank you for visiting my office today. I appreciate your commitment to managing your skin health.  Here is a summary of the key points from today's visit:  - We treated the inflamed seborrheic keratosis on your cheek with liquid nitrogen. A part of it has fallen off, but a follow-up treatment will be necessary as we aim to avoid overfreezing and potential permanent discoloration. - Continue to wash your face gently, avoiding vigorous scrubbing on the treated area. - Apply a generous amount of Vaseline on the treated area twice daily, in the morning and at night, to promote healing.  Please monitor the treated areas for any changes and do not hesitate to contact the office if you have any concerns or questions. We will schedule a follow-up appointment to reassess and possibly treat the area again.  Wishing you a comfortable rest of the day working from home. Stay cool and take it easy around the holiday!        Cryotherapy Aftercare  Wash gently with soap and water everyday.   Apply Vaseline and Band-Aid daily until healed.   Due to recent changes in healthcare laws, you may see results of your pathology and/or laboratory studies on MyChart before the doctors have had a chance to review them. We understand that in some cases there may be results that are confusing or concerning to you. Please understand that not all results are received at the same time and often the doctors may need to interpret multiple results in order to provide you with the best plan of care or course of treatment. Therefore, we ask that you please give [place] 2 business days to thoroughly review all your results before contacting the office for clarification. Should we see a critical lab result, you will be contacted sooner.   If You Need Anything After Your Visit  If you have any questions or concerns for your doctor, please call our main line at [phone] If no one answers, please leave a voicemail as directed  and we will return your call as soon as possible. Messages left after 4 pm will be answered the following business day.   You may also send [place] a message via MyChart. We typically respond to MyChart messages within 1-2 business days.  For prescription refills, please ask your pharmacy to contact our office. Our fax number is [phone].  If you have an urgent issue when the clinic is closed that cannot wait until the next business day, you can page your doctor at the number below.    Please note that while we do our best to be available for urgent issues outside of office hours, we are not available 24/7.   If you have an urgent issue and are unable to reach [place], you may choose to seek medical care at your doctor's office, retail clinic, urgent care center, or emergency room.  If you have a medical emergency, please immediately call 911 or go to the emergency department. In the event of inclement weather, please call our main line at [phone] for an update on the status of any delays or closures.  Dermatology Medication Tips: Please keep the boxes that topical medications come in in order to help keep track of the instructions about where and how to use these. Pharmacies typically print the medication instructions only on the boxes and not directly on the medication tubes.   If your medication is too expensive, please contact our office at [phone] or send [place] a message through  MyChart.   We are unable to tell what your co-pay for medications will be in advance as this is different depending on your insurance coverage. However, we may be able to find a substitute medication at lower cost or fill out paperwork to get insurance to cover a needed medication.   If a prior authorization is required to get your medication covered by your insurance company, please allow [place] 1-2 business days to complete this process.  Drug prices often vary depending on where the prescription is filled and  some pharmacies may offer cheaper prices.  The website www.goodrx.com contains coupons for medications through different pharmacies. The prices here do not account for what the cost may be with help from insurance (it may be cheaper with your insurance), but the website can give you the price if you did not use any insurance.  - You can print the associated coupon and take it with your prescription to the pharmacy.  - You may also stop by our office during regular business hours and pick up a GoodRx coupon card.  - If you need your prescription sent electronically to a different pharmacy, notify our office through [facility] or by phone at [phone]

## 2022-07-25 NOTE — Progress Notes (Signed)
   Follow-Up Visit   Subjective  Melody Zhang is a 67 y.o. female who presents for the following: She is here today to have the ISK of left cheek rechecked. It was treated with LN2 05/21/2022 but did not resolve all the way.   The following portions of the chart were reviewed this encounter and updated as appropriate: medications, allergies, medical history  Review of Systems:  No other skin or systemic complaints except as noted in HPI or Assessment and Plan.  Objective  Well appearing patient in no apparent distress; mood and affect are within normal limits.   A focused examination was performed of the following areas: Face  Relevant exam findings are noted in the Assessment and Plan.  Left cheek 1.0 cm brown stuck on papule   Previously treated inflamed seborrheic keratosis on the left cheek, partially detached after cryotherapy. Described as a one centimeter brown crusted, stuck-on papule. Patient reported post-treatment crusting described as scaly.   - Other Skin Lesions: Additional lesions previously treated with cryotherapy on the body, one of which detached in a large clump without leaving a scar.  Assessment & Plan     Inflamed seborrheic keratosis Left cheek  Residual Inflamed SK  Symptomatic, irritating, patient would like treated.  Benign-appearing.  Call clinic for new or changing lesions.   Prior to procedure, discussed risks of blister formation, small wound, skin dyspigmentation, or rare scar following treatment. Recommend Vaseline ointment to treated areas while healing.   Destruction of lesion - Left cheek Complexity: simple   Destruction method: cryotherapy   Informed consent: discussed and consent obtained   Timeout:  patient name, date of birth, surgical site, and procedure verified Lesion destroyed using liquid nitrogen: Yes   Region frozen until ice ball extended beyond lesion: Yes   Outcome: patient tolerated procedure well with no  complications   Post-procedure details: wound care instructions given    2. Resolved seborrheic keratosis on the body    - Plan: No further treatment needed as the lesion has fallen off completely. Continue to monitor for any new lesions.  3. Resolved wart (right eye)    - Plan: No further treatment needed as the wart has been successfully removed. Continue to monitor for any recurrence or new warts.  Follow-up: - Schedule a follow-up appointment in 4-6 weeks to assess the healing of the treated seborrheic keratosis and monitor for any new skin lesions.    Return if symptoms worsen or fail to improve.  I, Joanie Coddington, CMA, am acting as scribe for [employer], DO .   Documentation: I have reviewed the above documentation for accuracy and completeness, and I agree with the above.  Langston Reusing, DO

## 2022-08-12 ENCOUNTER — Ambulatory Visit (INDEPENDENT_AMBULATORY_CARE_PROVIDER_SITE_OTHER): Payer: 59 | Admitting: Podiatry

## 2022-08-12 ENCOUNTER — Encounter: Payer: Self-pay | Admitting: Podiatry

## 2022-08-12 ENCOUNTER — Ambulatory Visit (INDEPENDENT_AMBULATORY_CARE_PROVIDER_SITE_OTHER): Payer: 59

## 2022-08-12 ENCOUNTER — Other Ambulatory Visit: Payer: Self-pay | Admitting: Podiatry

## 2022-08-12 DIAGNOSIS — Z9889 Other specified postprocedural states: Secondary | ICD-10-CM

## 2022-08-12 NOTE — Progress Notes (Signed)
Subjective:   Patient ID: Melody Zhang, female   DOB: 67 y.o.   MRN: [id number]   HPI Patient states she is doing very well with surgery very pleased   ROS      Objective:  Physical Exam  Neurovascular status intact right first MPJ has limited motion but no pain in the joint surface minimal edema noted     Assessment:  Overall doing well post implant arthroplasty first MPJ right     Plan:  Final x-ray reviewed discussed the continuation of exercises and walking on the inside of her foot patient's discharge will be seen back as needed  X-rays indicate that the implant is in perfect position and functioning well

## 2022-08-21 ENCOUNTER — Other Ambulatory Visit (HOSPITAL_COMMUNITY): Payer: Self-pay

## 2022-08-21 ENCOUNTER — Other Ambulatory Visit: Payer: Self-pay

## 2022-09-20 ENCOUNTER — Other Ambulatory Visit (HOSPITAL_COMMUNITY): Payer: Self-pay

## 2022-09-20 MED ORDER — GOLYTELY 236 G PO SOLR
ORAL | 0 refills | Status: DC
  Filled 2022-09-20: qty 4000, 1d supply, fill #0

## 2022-10-04 DIAGNOSIS — K5730 Diverticulosis of large intestine without perforation or abscess without bleeding: Secondary | ICD-10-CM | POA: Diagnosis not present

## 2022-10-04 DIAGNOSIS — Z86010 Personal history of colonic polyps: Secondary | ICD-10-CM | POA: Diagnosis not present

## 2022-10-04 DIAGNOSIS — Z1211 Encounter for screening for malignant neoplasm of colon: Secondary | ICD-10-CM | POA: Diagnosis not present

## 2022-10-04 DIAGNOSIS — K635 Polyp of colon: Secondary | ICD-10-CM | POA: Diagnosis not present

## 2022-10-04 DIAGNOSIS — D122 Benign neoplasm of ascending colon: Secondary | ICD-10-CM | POA: Diagnosis not present

## 2022-10-14 ENCOUNTER — Encounter: Payer: Self-pay | Admitting: Gastroenterology

## 2022-10-25 DIAGNOSIS — E1159 Type 2 diabetes mellitus with other circulatory complications: Secondary | ICD-10-CM | POA: Diagnosis not present

## 2022-10-25 DIAGNOSIS — I10 Essential (primary) hypertension: Secondary | ICD-10-CM | POA: Diagnosis not present

## 2022-10-25 DIAGNOSIS — G4733 Obstructive sleep apnea (adult) (pediatric): Secondary | ICD-10-CM | POA: Diagnosis not present

## 2022-10-25 DIAGNOSIS — H35033 Hypertensive retinopathy, bilateral: Secondary | ICD-10-CM | POA: Diagnosis not present

## 2022-10-25 DIAGNOSIS — E7800 Pure hypercholesterolemia, unspecified: Secondary | ICD-10-CM | POA: Diagnosis not present

## 2022-10-25 DIAGNOSIS — Z6841 Body Mass Index (BMI) 40.0 and over, adult: Secondary | ICD-10-CM | POA: Diagnosis not present

## 2022-10-28 ENCOUNTER — Other Ambulatory Visit (HOSPITAL_COMMUNITY): Payer: Self-pay

## 2022-10-28 MED ORDER — ROSUVASTATIN CALCIUM 40 MG PO TABS
40.0000 mg | ORAL_TABLET | Freq: Every day | ORAL | 0 refills | Status: DC
  Filled 2022-10-28: qty 90, 90d supply, fill #0
  Filled 2023-03-13: qty 90, 90d supply, fill #1

## 2022-10-30 ENCOUNTER — Other Ambulatory Visit (HOSPITAL_COMMUNITY): Payer: Self-pay

## 2022-10-30 ENCOUNTER — Ambulatory Visit
Admission: RE | Admit: 2022-10-30 | Discharge: 2022-10-30 | Disposition: A | Discharge status: Home / Self Care | Payer: 59 | Source: Ambulatory Visit | Attending: Family Medicine | Admitting: Family Medicine

## 2022-10-30 DIAGNOSIS — N958 Other specified menopausal and perimenopausal disorders: Secondary | ICD-10-CM | POA: Diagnosis not present

## 2022-10-30 DIAGNOSIS — E2839 Other primary ovarian failure: Secondary | ICD-10-CM

## 2022-10-30 DIAGNOSIS — E349 Endocrine disorder, unspecified: Secondary | ICD-10-CM | POA: Diagnosis not present

## 2022-10-30 MED ORDER — INFLUENZA VAC A&B SURF ANT ADJ 0.5 ML IM SUSY
0.5000 mL | PREFILLED_SYRINGE | Freq: Once | INTRAMUSCULAR | 0 refills | Status: AC
  Filled 2022-10-30: qty 0.5, 1d supply, fill #0

## 2022-10-30 MED ORDER — ZOSTER VAC RECOMB ADJUVANTED 50 MCG/0.5ML IM SUSR
0.5000 mL | Freq: Once | INTRAMUSCULAR | 0 refills | Status: AC
  Filled 2022-10-30: qty 0.5, 1d supply, fill #0

## 2022-11-20 ENCOUNTER — Other Ambulatory Visit: Payer: Self-pay

## 2022-11-20 ENCOUNTER — Other Ambulatory Visit (HOSPITAL_COMMUNITY): Payer: Self-pay

## 2022-11-20 MED ORDER — DILTIAZEM HCL 120 MG PO TABS
120.0000 mg | ORAL_TABLET | Freq: Every evening | ORAL | 1 refills | Status: AC
Start: 2022-11-20 — End: ?
  Filled 2022-11-20: qty 90, 90d supply, fill #0
  Filled 2023-03-13: qty 90, 90d supply, fill #1
  Filled 2023-07-10: qty 90, 90d supply, fill #2

## 2022-11-20 MED ORDER — LOSARTAN POTASSIUM-HCTZ 100-25 MG PO TABS
0.5000 | ORAL_TABLET | Freq: Every day | ORAL | 1 refills | Status: AC
Start: 2022-11-20 — End: ?
  Filled 2022-11-20: qty 45, 90d supply, fill #0

## 2022-11-26 DIAGNOSIS — G4733 Obstructive sleep apnea (adult) (pediatric): Secondary | ICD-10-CM | POA: Diagnosis not present

## 2022-12-04 ENCOUNTER — Ambulatory Visit (INDEPENDENT_AMBULATORY_CARE_PROVIDER_SITE_OTHER): Payer: 59 | Admitting: Internal Medicine

## 2022-12-04 ENCOUNTER — Other Ambulatory Visit (HOSPITAL_COMMUNITY): Payer: Self-pay

## 2022-12-04 ENCOUNTER — Encounter (INDEPENDENT_AMBULATORY_CARE_PROVIDER_SITE_OTHER): Payer: Self-pay | Admitting: Internal Medicine

## 2022-12-04 VITALS — BP 119/80 | HR 75 | Temp 98.2°F | Ht 67.0 in | Wt 255.0 lb

## 2022-12-04 DIAGNOSIS — C50212 Malignant neoplasm of upper-inner quadrant of left female breast: Secondary | ICD-10-CM | POA: Diagnosis not present

## 2022-12-04 DIAGNOSIS — I10 Essential (primary) hypertension: Secondary | ICD-10-CM | POA: Diagnosis not present

## 2022-12-04 DIAGNOSIS — E785 Hyperlipidemia, unspecified: Secondary | ICD-10-CM | POA: Diagnosis not present

## 2022-12-04 DIAGNOSIS — E66812 Obesity, class 2: Secondary | ICD-10-CM

## 2022-12-04 DIAGNOSIS — Z6839 Body mass index (BMI) 39.0-39.9, adult: Secondary | ICD-10-CM

## 2022-12-04 DIAGNOSIS — Z0289 Encounter for other administrative examinations: Secondary | ICD-10-CM

## 2022-12-04 DIAGNOSIS — Z170 Estrogen receptor positive status [ER+]: Secondary | ICD-10-CM

## 2022-12-04 DIAGNOSIS — E1169 Type 2 diabetes mellitus with other specified complication: Secondary | ICD-10-CM | POA: Diagnosis not present

## 2022-12-04 DIAGNOSIS — Z7985 Long-term (current) use of injectable non-insulin antidiabetic drugs: Secondary | ICD-10-CM | POA: Diagnosis not present

## 2022-12-04 DIAGNOSIS — G4733 Obstructive sleep apnea (adult) (pediatric): Secondary | ICD-10-CM

## 2022-12-04 NOTE — Progress Notes (Signed)
Office: [phone]  /  Fax: [phone]   Initial Visit  Melody Zhang was seen in clinic today to evaluate for obesity. She is interested in losing weight to improve overall health and reduce the risk of weight related complications. She presents today to review program treatment options, initial physical assessment, and evaluation.     She was referred by: PCP, patient does not perceive weight   When asked what else they would like to accomplish? She states: Adopt healthier eating patterns  Weight history: started gain weight with kids as an adult  When asked how has your weight affected you? She states: Contributed to medical problems  Some associated conditions: Hypertension, Hyperlipidemia, OSA, Diabetes, and Other: breast cancer  Contributing factors: Family history, Medications, Reduced physical activity, Hx of pregnancies, Menopause, and Other: chronic skipping of meals.  Weight promoting medications identified: Contraceptives or hormonal therapy  Current nutrition plan: None  Current level of physical activity: None  Current or previous pharmacotherapy: GLP-1 on Ozempic for 2 years, was 270's   Response to medication: Lost weight initially but was unable to sustain weight loss   Past medical history includes:   Past Medical History:  Diagnosis Date   Breast cancer (HCC) 01/2017   LEFT BREAST CA   Cancer (HCC)    breast cancer   Diabetes mellitus    Family history of adverse reaction to anesthesia    Brother's heart stopped during surgery on urethra at [facility]   Family history of breast cancer    Family history of pancreatic cancer    GERD (gastroesophageal reflux disease)    not on medication for this, TUMS PRN   Heart murmur    Hyperlipemia    Hypertension    Intermittent palpitations    once a month   Paroxysmal SVT (supraventricular tachycardia) (HCC)    Myoview was performed 03/26/11: Low risk with small partially reversible apical perfusion defect  likely breast attenuation, no ischemia, no scar, EF 62%.    Personal history of radiation therapy 2019   Sleep apnea    CPAP     Objective:   BP 119/80   Pulse 75   Temp 98.2 F (36.8 C)   Ht 5\' 7"  (1.702 m)   Wt 255 lb (115.7 kg)   SpO2 98%   BMI 39.94 kg/m  She was weighed on the bioimpedance scale: Body mass index is 39.94 kg/m.  Peak Weight:275 , Body Fat%: 50.7, Visceral Fat Rating: 17, Weight trend over the last 12 months: Increasing  General:  Alert, oriented and cooperative. Patient is in no acute distress.  Respiratory: Normal respiratory effort, no problems with respiration noted   Gait: able to ambulate independently  Mental Status: Normal mood and affect. Normal behavior. Normal judgment and thought content.   DIAGNOSTIC DATA REVIEWED:  BMET    Component Value Date/Time   NA 140 03/18/2017 1411   K 3.5 03/18/2017 1411   CL 102 03/18/2017 1411   CO2 26 03/18/2017 1411   GLUCOSE 99 03/18/2017 1411   BUN 13 03/18/2017 1411   CREATININE 0.93 03/18/2017 1411   CREATININE 0.92 03/05/2017 1206   CALCIUM 9.7 03/18/2017 1411   GFRNONAA >60 03/18/2017 1411   GFRNONAA >60 03/05/2017 1206   GFRAA >60 03/18/2017 1411   GFRAA >60 03/05/2017 1206   Lab Results  Component Value Date   HGBA1C 6.7 (H) 03/18/2017   HGBA1C 6.1 (H) 03/17/2011   No results found for: "INSULIN" CBC    Component Value  Date/Time   WBC 7.4 03/18/2017 1411   RBC 4.66 03/18/2017 1411   HGB 12.0 03/18/2017 1411   HGB 12.1 03/05/2017 1206   HCT 38.1 03/18/2017 1411   PLT 280 03/18/2017 1411   PLT 252 03/05/2017 1206   MCV 81.8 03/18/2017 1411   MCH 25.8 (L) 03/18/2017 1411   MCHC 31.5 03/18/2017 1411   RDW 15.7 (H) 03/18/2017 1411   Iron/TIBC/Ferritin/ %Sat No results found for: "IRON", "TIBC", "FERRITIN", "IRONPCTSAT" Lipid Panel     Component Value Date/Time   CHOL 163 03/18/2011 0331   TRIG 114 03/18/2011 0331   HDL 49 03/18/2011 0331   CHOLHDL 3.3 03/18/2011 0331   VLDL 23  03/18/2011 0331   LDLCALC 91 03/18/2011 0331   Hepatic Function Panel     Component Value Date/Time   PROT 7.4 03/05/2017 1206   ALBUMIN 3.7 03/05/2017 1206   AST 14 03/05/2017 1206   ALT 13 03/05/2017 1206   ALKPHOS 73 03/05/2017 1206   BILITOT 0.3 03/05/2017 1206      Component Value Date/Time   TSH 2.664 03/17/2011 2210     Assessment and Plan:   Essential hypertension Assessment & Plan: Blood pressure is well-controlled.  Currently on losartan, hydrochlorothiazide and diltiazem.  She will continue current regimen.  We will check renal parameters with intake labs.  Losing 10% of body weight may improve blood pressure control.   Type 2 diabetes mellitus with other specified complication, without long-term current use of insulin (HCC) Assessment & Plan: I do not have a most recent hemoglobin A1c the last 1 on file is from a few years ago and it was 6.7.  We will look in Care Everywhere to obtain the most updated labs.  We we will check hemoglobin A1c with intake labs as well as lipids and urine microalbumin.    Hyperlipidemia associated with type 2 diabetes mellitus (HCC)  OSA (obstructive sleep apnea) Assessment & Plan: On CPAP with reported good compliance. Continue PAP therapy. Losing 15% or more of body weight may improve AHI.  Patient counseled also on the effects of sleep apnea on weight.    Malignant neoplasm of upper-inner quadrant of left breast in female, estrogen receptor positive (HCC) Assessment & Plan: Patient is on letrozole which may contribute to weight gain.  We discussed how obesity may increase her risk of recurrence.   Class 2 severe obesity with serious comorbidity and body mass index (BMI) of 39.0 to 39.9 in adult, unspecified obesity type [facility]) Assessment & Plan: We reviewed anthropometrics, biometrics, associated medical conditions and contributing factors with patient. she would benefit from a medically tailored reduced calorie nutrional plan  based on her REE (resting energy expenditure), which will be determined by indirect calorimetry.  We will also assess for cardiometabolic risk and nutritional derangements via fasting labs at intake appointment.           Obesity Treatment / Action Plan:  Patient will work on garnering support from family and friends to begin weight loss journey. Will work on eliminating or reducing the presence of highly palatable, calorie dense foods in the home. Will complete provided nutritional and psychosocial assessment questionnaire before the next appointment. Will be scheduled for indirect calorimetry to determine resting energy expenditure in a fasting state.  This will allow [place] to create a reduced calorie, high-protein meal plan to promote loss of fat mass while preserving muscle mass. Counseled on the health benefits of losing 5%-15% of total body weight. Was counseled on  nutritional approaches to weight loss and benefits of reducing processed foods and consuming plant-based foods and high quality protein as part of nutritional weight management. Was counseled on pharmacotherapy and role as an adjunct in weight management.   Obesity Education Performed Today:  She was weighed on the bioimpedance scale and results were discussed and documented in the synopsis.  We discussed obesity as a disease and the importance of a more detailed evaluation of all the factors contributing to the disease.  We discussed the importance of long term lifestyle changes which include nutrition, exercise and behavioral modifications as well as the importance of customizing this to her specific health and social needs.  We discussed the benefits of reaching a healthier weight to alleviate the symptoms of existing conditions and reduce the risks of the biomechanical, metabolic and psychological effects of obesity.  Melody Zhang appears to be in the action stage of change and states they are ready to start  intensive lifestyle modifications and behavioral modifications.  30 minutes was spent today on this visit including the above counseling, pre-visit chart review, and post-visit documentation.  Reviewed by clinician on day of visit: allergies, medications, problem list, medical history, surgical history, family history, social history, and previous encounter notes pertinent to obesity diagnosis.   Worthy Rancher, MD

## 2022-12-04 NOTE — Assessment & Plan Note (Signed)
We reviewed anthropometrics, biometrics, associated medical conditions and contributing factors with patient. she would benefit from a medically tailored reduced calorie nutrional plan based on her REE (resting energy expenditure), which will be determined by indirect calorimetry.  We will also assess for cardiometabolic risk and nutritional derangements via fasting labs at intake appointment.

## 2022-12-04 NOTE — Assessment & Plan Note (Signed)
Blood pressure is well-controlled.  Currently on losartan, hydrochlorothiazide and diltiazem.  She will continue current regimen.  We will check renal parameters with intake labs.  Losing 10% of body weight may improve blood pressure control.

## 2022-12-04 NOTE — Assessment & Plan Note (Signed)
Patient is on letrozole which may contribute to weight gain.  We discussed how obesity may increase her risk of recurrence.

## 2022-12-04 NOTE — Assessment & Plan Note (Signed)
I do not have a most recent hemoglobin A1c the last 1 on file is from a few years ago and it was 6.7.  We will look in Care Everywhere to obtain the most updated labs.  We we will check hemoglobin A1c with intake labs as well as lipids and urine microalbumin.

## 2022-12-04 NOTE — Assessment & Plan Note (Signed)
On CPAP with reported good compliance. Continue PAP therapy. Losing 15% or more of body weight may improve AHI.  Patient counseled also on the effects of sleep apnea on weight.

## 2022-12-13 DIAGNOSIS — E7800 Pure hypercholesterolemia, unspecified: Secondary | ICD-10-CM | POA: Diagnosis not present

## 2022-12-27 DIAGNOSIS — G4733 Obstructive sleep apnea (adult) (pediatric): Secondary | ICD-10-CM | POA: Diagnosis not present

## 2023-01-01 ENCOUNTER — Ambulatory Visit (INDEPENDENT_AMBULATORY_CARE_PROVIDER_SITE_OTHER): Payer: 59 | Admitting: Internal Medicine

## 2023-01-01 ENCOUNTER — Encounter (INDEPENDENT_AMBULATORY_CARE_PROVIDER_SITE_OTHER): Payer: Self-pay | Admitting: Internal Medicine

## 2023-01-01 VITALS — BP 128/81 | HR 74 | Temp 98.2°F | Ht 67.0 in | Wt 251.0 lb

## 2023-01-01 DIAGNOSIS — Z1331 Encounter for screening for depression: Secondary | ICD-10-CM | POA: Diagnosis not present

## 2023-01-01 DIAGNOSIS — E1169 Type 2 diabetes mellitus with other specified complication: Secondary | ICD-10-CM

## 2023-01-01 DIAGNOSIS — E66812 Obesity, class 2: Secondary | ICD-10-CM

## 2023-01-01 DIAGNOSIS — E785 Hyperlipidemia, unspecified: Secondary | ICD-10-CM

## 2023-01-01 DIAGNOSIS — R5383 Other fatigue: Secondary | ICD-10-CM

## 2023-01-01 DIAGNOSIS — R0602 Shortness of breath: Secondary | ICD-10-CM

## 2023-01-01 DIAGNOSIS — Z6839 Body mass index (BMI) 39.0-39.9, adult: Secondary | ICD-10-CM

## 2023-01-01 DIAGNOSIS — I10 Essential (primary) hypertension: Secondary | ICD-10-CM | POA: Diagnosis not present

## 2023-01-01 DIAGNOSIS — Z7985 Long-term (current) use of injectable non-insulin antidiabetic drugs: Secondary | ICD-10-CM

## 2023-01-01 DIAGNOSIS — G4733 Obstructive sleep apnea (adult) (pediatric): Secondary | ICD-10-CM

## 2023-01-01 NOTE — Progress Notes (Signed)
Office: [phone]  /  Fax: [phone]   Subjective   Initial Visit  Melody Zhang (MR# [id number]) is an 67 y.o. female who presents for evaluation and treatment of obesity and related comorbidities. Current BMI is Body mass index is 39.31 kg/m. Melody Zhang has been struggling with her weight for many years and has been unsuccessful in either losing weight, maintaining weight loss, or reaching her healthy weight goal.  Melody Zhang is currently in the action stage of change and ready to dedicate time achieving and maintaining a healthier weight. Melody Zhang is interested in becoming our patient and working on intensive lifestyle modifications including (but not limited to) diet and exercise for weight loss.  When asked how their weight has affected their life and health, she states: Contributed to medical problems  When asked what else they would like to accomplish? She states: Adopt healthier eating patterns, Improve energy levels and physical activity, Improve existing medical conditions, and Improve quality of life  Weight history:  She starting to note weight gain during : adulthood.  Life events associated with weight gain include : pregnancy.   Other contributing factors: Family history of obesity, Use of obesogenic medications: Contraceptives or hormonal therapy, Reduced physical activity, Menopause, and chronic skipping of meals .  Their highest weight has been:  275 lbs.  Previous weight-loss programs : Weight Watchers.  Their maximum weight loss was:  23 lbs.  Their greatest challenge with dieting: none.  Weight promoting medications identified: Contraceptives or hormonal therapy  Current or previous pharmacotherapy: GLP-1.  Response to medication: Lost weight and was able to maintain weight loss and plateaued  Nutritional History:  Current nutrition plan: None.  How often do they eat breakfast : everyday.  Number of times they eat per day: 2  What beverages  do they drink: regular soda rarely, juice 2-4 per week, and sweet tea 5-7 per week.   Use of artificial sweetners : No  Food intolerances or restrictions: none.  Food triggers: None.  Food cravings: Sugary, Starches, and Salty  Current level of physical activity: None  Past medical history includes:   Past Medical History:  Diagnosis Date   Back pain    Breast cancer (HCC) 01/2017   LEFT BREAST CA   Cancer (HCC)    breast cancer   Constipation    Diabetes mellitus    Edema of both lower extremities    Family history of adverse reaction to anesthesia    Brother's heart stopped during surgery on urethra at [facility]   Family history of breast cancer    Family history of pancreatic cancer    GERD (gastroesophageal reflux disease)    not on medication for this, TUMS PRN   Heart murmur    Hx of blood clots    Hyperlipemia    Hypertension    Intermittent palpitations    once a month   Palpitations    Paroxysmal SVT (supraventricular tachycardia) (HCC)    Myoview was performed 03/26/11: Low risk with small partially reversible apical perfusion defect likely breast attenuation, no ischemia, no scar, EF 62%.    Personal history of radiation therapy 2019   Sleep apnea    CPAP     Objective   BP 128/81   Pulse 74   Temp 98.2 F (36.8 C)   Ht 5\' 7"  (1.702 m)   Wt 251 lb (113.9 kg)   SpO2 97%   BMI 39.31 kg/m  She was weighed on the bioimpedance scale: Body mass index  is 39.31 kg/m.    Anthropometrics:  Vitals Temp: 98.2 F (36.8 C) BP: 128/81 Pulse Rate: 74 SpO2: 97 %   Anthropometric Measurements Height: 5\' 7"  (1.702 m) Weight: 251 lb (113.9 kg) BMI (Calculated): 39.3 Starting Weight: 251 lb Peak Weight: 272 lb Waist Measurement : 46 inches   Body Composition  Body Fat %: 49.7 % Fat Mass (lbs): 125 lbs Muscle Mass (lbs): 120 lbs Total Body Water (lbs): 84.2 lbs Visceral Fat Rating : 16   Other Clinical Data RMR: [id number] Fasting: Yes Labs:  Yes Today's Visit #: 1 Starting Date: 01/01/23    Physical Exam:  General: She is overweight, cooperative, alert, well developed, and in no acute distress. PSYCH: Has normal mood, affect and thought process.   HEENT: EOMI, sclerae are anicteric. Lungs: Normal breathing effort, no conversational dyspnea. Extremities: No edema.  Neurologic: No gross sensory or motor deficits. No tremors or fasciculations noted.    Diagnostic Data Reviewed  EKG: Normal sinus rhythm, rate 72 BPM.  Indirect Calorimeter completed today shows a VO2 of 243 and a REE of 1670.  Her calculated basal metabolic rate is 1610 thus her resting energy expenditure same as calculated.  Depression Screen  Melody Zhang's PHQ-9 score was: 3.     07/14/2017    2:28 PM  Depression screen PHQ 2/9  Decreased Interest 0  Down, Depressed, Hopeless 0  PHQ - 2 Score 0    Screening for Sleep Related Breathing Disorders  Melody Zhang admits to daytime somnolence and reports waking up still tired. Patient has a history of symptoms of daytime fatigue. Melody Zhang generally gets 8 to 10 hours of sleep per night, and states that she has generally restful sleep. Snoring is present. Apneic episodes are present. Epworth Sleepiness Score is 3.   BMET    Component Value Date/Time   NA 140 03/18/2017 1411   K 3.5 03/18/2017 1411   CL 102 03/18/2017 1411   CO2 26 03/18/2017 1411   GLUCOSE 99 03/18/2017 1411   BUN 13 03/18/2017 1411   CREATININE 0.93 03/18/2017 1411   CREATININE 0.92 03/05/2017 1206   CALCIUM 9.7 03/18/2017 1411   GFRNONAA >60 03/18/2017 1411   GFRNONAA >60 03/05/2017 1206   GFRAA >60 03/18/2017 1411   GFRAA >60 03/05/2017 1206   Lab Results  Component Value Date   HGBA1C 6.7 (H) 03/18/2017   HGBA1C 6.1 (H) 03/17/2011   No results found for: "INSULIN" CBC    Component Value Date/Time   WBC 7.4 03/18/2017 1411   RBC 4.66 03/18/2017 1411   HGB 12.0 03/18/2017 1411   HGB 12.1 03/05/2017 1206   HCT 38.1  03/18/2017 1411   PLT 280 03/18/2017 1411   PLT 252 03/05/2017 1206   MCV 81.8 03/18/2017 1411   MCH 25.8 (L) 03/18/2017 1411   MCHC 31.5 03/18/2017 1411   RDW 15.7 (H) 03/18/2017 1411   Iron/TIBC/Ferritin/ %Sat No results found for: "IRON", "TIBC", "FERRITIN", "IRONPCTSAT" Lipid Panel     Component Value Date/Time   CHOL 163 03/18/2011 0331   TRIG 114 03/18/2011 0331   HDL 49 03/18/2011 0331   CHOLHDL 3.3 03/18/2011 0331   VLDL 23 03/18/2011 0331   LDLCALC 91 03/18/2011 0331   Hepatic Function Panel     Component Value Date/Time   PROT 7.4 03/05/2017 1206   ALBUMIN 3.7 03/05/2017 1206   AST 14 03/05/2017 1206   ALT 13 03/05/2017 1206   ALKPHOS 73 03/05/2017 1206   BILITOT 0.3 03/05/2017 1206  Component Value Date/Time   TSH 2.664 03/17/2011 2210     Assessment and Plan   TREATMENT PLAN FOR OBESITY:  Recommended Dietary Goals  Melody Zhang is currently in the action stage of change. As such, her goal is to implement medically supervised weight loss plan.  She has agreed to implement: the Category 2 plan - 1200 kcal per day  Behavioral Intervention  We discussed the following Behavioral Modification Strategies today: increasing lean protein intake to established goals, decreasing simple carbohydrates , increasing vegetables, increasing lower glycemic fruits, increasing fiber rich foods, avoiding skipping meals, increasing water intake, work on meal planning and preparation, reading food labels , keeping healthy foods at home, identifying sources and decreasing liquid calories, decreasing eating out or consumption of processed foods, and making healthy choices when eating convenient foods, planning for success, and better snacking choices  Additional resources provided today:  Category 2 packet  Recommended Physical Activity Goals  Janace has been advised to work up to 150 minutes of moderate intensity aerobic activity a week and strengthening exercises 2-3  times per week for cardiovascular health, weight loss maintenance and preservation of muscle mass.   She has agreed to :  Think about enjoyable ways to increase daily physical activity and overcoming barriers to exercise and Increase physical activity in their day and reduce sedentary time (increase NEAT).  Pharmacotherapy We will work on building a Therapist, art and behavioral strategies. We will discuss the role of pharmacotherapy as an adjunct at subsequent visits.   ASSOCIATED CONDITIONS ADDRESSED TODAY   Shortness of Breath Taleshia notes increasing shortness of breath with exercising and seems to be worsening over time with weight gain. She notes getting out of breath sooner with activity than she used to. This has not gotten worse recently. Hollyn denies shortness of breath at rest or orthopnea.Sumiya notes increasing shortness of breath with exercising and seems to be worsening over time with weight gain. She notes getting out of breath sooner with activity than she used to. This has not gotten worse recently. Cassidi denies shortness of breath at rest or orthopnea.  Other fatigue -     EKG 12-Lead  SOB (shortness of breath) on exertion  Depression screen  Type 2 diabetes mellitus with other specified complication, without long-term current use of insulin [facility]) Assessment & Plan: I reviewed all of her labs from December 16, 2022 from [facility].  Her hemoglobin A1c was 6.4.  She is currently on Ozempic 2 mg once a week without any adverse effects.  She benefits from a low-carb high-protein meal plan.  We discussed the benefits of reducing simple and added sugars in her diet.  She was also counseled on the carb insulin model of obesity.  She will continue current regimen and implementation of reduced calorie nutrition plan.  Her LDL cholesterol is at 93 she has a family history of heart disease and her statin was recently increased which  I agree with for a goal of 50% reduction in LDL cholesterol.  Orders: -     Vitamin B12 -     Insulin, random  Essential hypertension Assessment & Plan: Her blood pressure is well-controlled.  She is currently on losartan hydrochlorothiazide 100 mg 25 mg once a day, diltiazem 120 mg daily.  Losing 10% of body weight may improve blood pressure control.  Monitor for orthostasis while losing weight.  Continue current regimen   Hyperlipidemia associated with type 2 diabetes mellitus (HCC) Assessment & Plan: I reviewed  all of her labs from December 16, 2022 from [facility].  Her hemoglobin A1c was 6.4.  She is currently on Ozempic 2 mg once a week without any adverse effects.  She benefits from a low-carb high-protein meal plan.  We discussed the benefits of reducing simple and added sugars in her diet.  She was also counseled on the carb insulin model of obesity.  She will continue current regimen and implementation of reduced calorie nutrition plan.  Her LDL cholesterol is at 93 she has a family history of heart disease and her statin was recently increased which I agree with for a goal of 50% reduction in LDL cholesterol.    Orders: -     TSH  OSA (obstructive sleep apnea) Assessment & Plan: On CPAP with reported good compliance. Continue PAP therapy. Losing 15% or more of body weight may improve AHI.  Patient counseled also on the effects of sleep apnea on weight.    Class 2 severe obesity with serious comorbidity and body mass index (BMI) of 39.0 to 39.9 in adult, unspecified obesity type [facility]) Assessment & Plan: See obesity treatment plan  Orders: -     VITAMIN D 25 Hydroxy (Vit-D Deficiency, Fractures)      Follow-up  She was informed of the importance of frequent follow-up visits to maximize her success with intensive lifestyle modifications for her multiple health conditions. She was informed we would discuss her lab results at her next visit unless there  is a critical issue that needs to be addressed sooner. Melody Zhang agreed to keep her next visit at the agreed upon time to discuss these results.  Attestation Statement  This is the patient's intake visit at [facility]. The patient's Health Questionnaire was reviewed at length. Included in the packet: current and past health history, medications, allergies, ROS, gynecologic history (women only), surgical history, family history, social history, weight history, weight loss surgery history (for those that have had weight loss surgery), nutritional evaluation, mood and food questionnaire, PHQ9, Epworth questionnaire, sleep habits questionnaire, patient life and health improvement goals questionnaire. These will all be scanned into the patient's chart under media.   During the visit, I independently reviewed the patient's EKG, bioimpedance scale results, and indirect calorimetry results. I used this information to medically tailor a meal plan for the patient that will help her to lose weight and will improve her obesity-related conditions. I performed a medically necessary appropriate examination and/or evaluation. I discussed the assessment and treatment plan with the patient. The patient was provided an opportunity to ask questions and all were answered. The patient agreed with the plan and demonstrated an understanding of the instructions. Labs were ordered at this visit and will be reviewed at the next visit unless critical results need to be addressed immediately. Clinical information was updated and documented in the EMR.     Reviewed by clinician on day of visit: allergies, medications, problem list, medical history, surgical history, family history, social history, and previous encounter notes.  I have spent 60 minutes in the care of the patient today including: preparing to see patient (e.g. review and interpretation of tests, old notes ), obtaining and/or reviewing separately  obtained history, performing a medically appropriate examination or evaluation, counseling and educating the patient, ordering medications, test or procedures, documenting clinical information in the electronic or other health care record, and independently interpreting results and communicating results to the patient, family, or caregiver      Worthy Rancher,  MD

## 2023-01-01 NOTE — Assessment & Plan Note (Signed)
 See obesity treatment plan

## 2023-01-01 NOTE — Assessment & Plan Note (Signed)
Her blood pressure is well-controlled.  She is currently on losartan hydrochlorothiazide 100 mg 25 mg once a day, diltiazem 120 mg daily.  Losing 10% of body weight may improve blood pressure control.  Monitor for orthostasis while losing weight.  Continue current regimen

## 2023-01-01 NOTE — Assessment & Plan Note (Signed)
 On CPAP with reported good compliance. Continue PAP therapy. Losing 15% or more of body weight may improve AHI.  Patient counseled also on the effects of sleep apnea on weight.

## 2023-01-01 NOTE — Assessment & Plan Note (Signed)
I reviewed all of her labs from December 16, 2022 from [place] [facility].  Her hemoglobin A1c was 6.4.  She is currently on Ozempic 2 mg once a week without any adverse effects.  She benefits from a low-carb high-protein meal plan.  We discussed the benefits of reducing simple and added sugars in her diet.  She was also counseled on the carb insulin model of obesity.  She will continue current regimen and implementation of reduced calorie nutrition plan.  Her LDL cholesterol is at 93 she has a family history of heart disease and her statin was recently increased which I agree with for a goal of 50% reduction in LDL cholesterol.

## 2023-01-03 LAB — TSH: TSH: 1.27 u[IU]/mL (ref 0.450–4.500)

## 2023-01-03 LAB — VITAMIN D 25 HYDROXY (VIT D DEFICIENCY, FRACTURES): Vit D, 25-Hydroxy: 52.7 ng/mL (ref 30.0–100.0)

## 2023-01-03 LAB — INSULIN, RANDOM: INSULIN: 45.1 u[IU]/mL — ABNORMAL HIGH (ref 2.6–24.9)

## 2023-01-03 LAB — VITAMIN B12: Vitamin B-12: 793 pg/mL (ref 232–1245)

## 2023-01-06 ENCOUNTER — Other Ambulatory Visit (HOSPITAL_COMMUNITY): Payer: Self-pay

## 2023-01-06 MED ORDER — LOSARTAN POTASSIUM-HCTZ 100-25 MG PO TABS
0.5000 | ORAL_TABLET | Freq: Every day | ORAL | 1 refills | Status: AC
  Filled 2023-03-13: qty 45, 90d supply, fill #0

## 2023-01-06 MED ORDER — POTASSIUM CHLORIDE CRYS ER 10 MEQ PO TBCR
10.0000 meq | EXTENDED_RELEASE_TABLET | Freq: Every day | ORAL | 1 refills | Status: AC
  Filled 2023-01-06: qty 90, 90d supply, fill #0
  Filled 2023-03-13 – 2023-05-23 (×2): qty 90, 90d supply, fill #1
  Filled 2023-08-29 – 2023-12-02 (×2): qty 20, 20d supply, fill #2

## 2023-01-15 ENCOUNTER — Ambulatory Visit (INDEPENDENT_AMBULATORY_CARE_PROVIDER_SITE_OTHER): Payer: 59 | Admitting: Internal Medicine

## 2023-01-15 ENCOUNTER — Encounter (INDEPENDENT_AMBULATORY_CARE_PROVIDER_SITE_OTHER): Payer: Self-pay | Admitting: Internal Medicine

## 2023-01-15 ENCOUNTER — Other Ambulatory Visit (HOSPITAL_COMMUNITY): Payer: Self-pay

## 2023-01-15 VITALS — BP 137/75 | HR 101 | Temp 98.4°F | Ht 67.0 in | Wt 245.0 lb

## 2023-01-15 DIAGNOSIS — Z6833 Body mass index (BMI) 33.0-33.9, adult: Secondary | ICD-10-CM

## 2023-01-15 DIAGNOSIS — G4733 Obstructive sleep apnea (adult) (pediatric): Secondary | ICD-10-CM

## 2023-01-15 DIAGNOSIS — Z7985 Long-term (current) use of injectable non-insulin antidiabetic drugs: Secondary | ICD-10-CM

## 2023-01-15 DIAGNOSIS — E669 Obesity, unspecified: Secondary | ICD-10-CM | POA: Diagnosis not present

## 2023-01-15 DIAGNOSIS — E119 Type 2 diabetes mellitus without complications: Secondary | ICD-10-CM

## 2023-01-15 DIAGNOSIS — E1169 Type 2 diabetes mellitus with other specified complication: Secondary | ICD-10-CM

## 2023-01-15 DIAGNOSIS — I10 Essential (primary) hypertension: Secondary | ICD-10-CM | POA: Diagnosis not present

## 2023-01-15 NOTE — Progress Notes (Signed)
Office: [phone]  /  Fax: [phone]  Weight Summary And Biometrics  Vitals Temp: 98.4 F (36.9 C) BP: 137/75 Pulse Rate: (!) 101 SpO2: 97 %   Anthropometric Measurements Height: 5\' 7"  (1.702 m) Weight: 245 lb (111.1 kg) BMI (Calculated): 38.36 Weight at Last Visit: 251 lb Weight Lost Since Last Visit: 6 lb Weight Gained Since Last Visit: 0 lb Starting Weight: 251 lb Total Weight Loss (lbs): 6 lb (2.722 kg) Peak Weight: 272 lb   Body Composition  Body Fat %: 48.6 % Fat Mass (lbs): 119.2 lbs Muscle Mass (lbs): 119.6 lbs Total Body Water (lbs): 83.2 lbs Visceral Fat Rating : 16  The ASCVD Risk score (Arnett DK, et al., 2019) failed to calculate for the following reasons:   Cannot find a previous HDL lab   Cannot find a previous total cholesterol lab   RMR: [id number]  Today's Visit #: 2  Starting Date: 01/01/23   Subjective   Chief Complaint: Obesity  Jaimelee is here to discuss her progress with her obesity treatment plan. She is on the the Category 2 Plan and states she is following her eating plan approximately 70 % of the time. She states she is not exercising.  Interval History:   Discussed the use of AI scribe software for clinical note transcription with the patient, who gave verbal consent to proceed.  History of Present Illness   Patient presents for medical weight management.  Has lost six pounds since the last visit. She attributes this weight loss to making healthier dietary choices and monitoring calorie intake. The patient has been following a diet plan but has expressed concerns about getting enough protein. She has been incorporating more water into her diet, replacing sugary drinks and apple juice.  The patient has been taking Ozempic 2mg  once a week and using a CPAP machine for sleep. She has not been engaging in regular physical exercise, with the exception of occasional walking during a recent trip to [place]. The patient works from home  and identifies lack of motivation and enjoyment as barriers to regular exercise.  The patient has a history of high insulin levels, indicating insulin resistance. She has been advised to limit her intake of simple carbohydrates and increase her intake of complex carbohydrates to manage this. The patient has shown understanding of this dietary change and has been making efforts to incorporate these changes into her diet.  The patient is also on cholesterol medication, which was recently increased to 40mg .   The patient is up-to-date with her mammogram and colonoscopy, and has had a bone density test within the last year. She has a history of breast cancer and is due to see her oncologist next month. The patient has lost ten pounds of fat since her initial visit in November, with no loss of muscle mass noted.       Orexigenic Control:  Denies problems with appetite and hunger signals.  Denies problems with satiety and satiation.  Denies problems with eating patterns and portion control.  Denies abnormal cravings. Denies feeling deprived or restricted.   Barriers identified: low volume of physical activity at present  and medical comorbidities.   Pharmacotherapy for weight loss: She is currently taking Ozempic with diabetes as the primary indication with adequate clinical response  and without side effects..   Assessment and Plan   Treatment Plan For Obesity:  Recommended Dietary Goals  Janya is currently in the action stage of change. As such, her goal is to  continue weight management plan. She has agreed to: continue current plan  Behavioral Intervention  We discussed the following Behavioral Modification Strategies today: continue to work on maintaining a reduced calorie state, getting the recommended amount of protein, incorporating whole foods, making healthy choices, staying well hydrated and practicing mindfulness when eating..  Additional resources provided today:  None  Recommended Physical Activity Goals  Zaidy has been advised to work up to 150 minutes of moderate intensity aerobic activity a week and strengthening exercises 2-3 times per week for cardiovascular health, weight loss maintenance and preservation of muscle mass.   She has agreed to :  Think about enjoyable ways to increase daily physical activity and overcoming barriers to exercise and Increase physical activity in their day and reduce sedentary time (increase NEAT).  Pharmacotherapy  We discussed various medication options to help Pessy with her weight loss efforts and we both agreed to : continue current anti-obesity medication regimen  Associated Conditions Addressed Today  Assessment and Plan    Obesity   She has lost 6 pounds since the last visit and 10 pounds since November through dietary changes and minimal physical activity, currently benefiting from Ozempic 2 mg weekly. She is making healthier food choices, reducing calorie intake, and increasing water consumption, without experiencing significant hunger and feeling satisfied with meals. She has expressed interest in incorporating physical activity into her routine. We discussed the benefits of adding exercise to enhance weight loss and muscle preservation. We will encourage the continuation of current dietary habits and recommend adding protein to meals, such as through protein shakes or [place] sausage, advising her to weigh portions of protein to ensure adequate intake. We suggest incorporating strength training exercises using dumbbells and a small bench and recommend purchasing a walking pad for home use to increase physical activity. We also encourage watching beginner workout videos on YouTube for guidance.  Type 2 diabetes / insulin Resistance   Her last A1c was 6.4 in outside labs.  Her insulin levels are elevated at 45.1, indicating a high degree of insulin resistance, exacerbated by the consumption of simple  carbohydrates. We educated her on the difference between simple and complex carbohydrates and the importance of choosing whole grains and high-fiber foods. We discussed that reducing carbohydrate intake and increasing physical activity can improve insulin sensitivity. We advise reducing intake of simple carbohydrates and processed foods, encouraging the consumption of complex carbohydrates, such as whole grains, beans, and vegetables. We recommend pairing carbohydrates with protein and fiber to slow sugar absorption and educate on reading food labels to identify high-fiber and high-protein foods. We discussed the benefits of exercise in reducing insulin resistance.  Hypertension   Her blood pressure management is suboptimal, currently on a time-released medication, which has been halved, leading to improper dosing and experienced symptoms of hypotension when taking the full dose. We discussed the importance of proper dosing and the potential need for medication adjustment. We advise against halving time-released medications and recommend discussing medication adjustment with her PCP to find an appropriate dose. We instruct her to monitor her blood pressure at home three days a week, checking in the morning and before bedtime, and advise contacting her PCP if blood pressure remains above 130/80 mmHg.  OSA Patient reports good adherence continue PAP therapy losing 15% of body weight may improve condition.  General Health Maintenance   She is up to date on mammogram, colonoscopy, and bone density screenings and sees her oncologist regularly due to breast cancer. We will continue  annual mammograms, follow up with her oncologist as scheduled, repeat colonoscopy in five years, and maintain regular bone density screenings.  Follow-up   We will schedule a follow-up appointment in four weeks.        Objective   Physical Exam:  Blood pressure 137/75, pulse (!) 101, temperature 98.4 F (36.9 C), height 5'  7" (1.702 m), weight 245 lb (111.1 kg), SpO2 97%. Body mass index is 38.37 kg/m.  General: She is overweight, cooperative, alert, well developed, and in no acute distress. PSYCH: Has normal mood, affect and thought process.   HEENT: EOMI, sclerae are anicteric. Lungs: Normal breathing effort, no conversational dyspnea. Extremities: No edema.  Neurologic: No gross sensory or motor deficits. No tremors or fasciculations noted.    Diagnostic Data Reviewed:  BMET    Component Value Date/Time   NA 140 03/18/2017 1411   K 3.5 03/18/2017 1411   CL 102 03/18/2017 1411   CO2 26 03/18/2017 1411   GLUCOSE 99 03/18/2017 1411   BUN 13 03/18/2017 1411   CREATININE 0.93 03/18/2017 1411   CREATININE 0.92 03/05/2017 1206   CALCIUM 9.7 03/18/2017 1411   GFRNONAA >60 03/18/2017 1411   GFRNONAA >60 03/05/2017 1206   GFRAA >60 03/18/2017 1411   GFRAA >60 03/05/2017 1206   Lab Results  Component Value Date   HGBA1C 6.7 (H) 03/18/2017   HGBA1C 6.1 (H) 03/17/2011   Lab Results  Component Value Date   INSULIN 45.1 (H) 01/01/2023   Lab Results  Component Value Date   TSH 1.270 01/01/2023   CBC    Component Value Date/Time   WBC 7.4 03/18/2017 1411   RBC 4.66 03/18/2017 1411   HGB 12.0 03/18/2017 1411   HGB 12.1 03/05/2017 1206   HCT 38.1 03/18/2017 1411   PLT 280 03/18/2017 1411   PLT 252 03/05/2017 1206   MCV 81.8 03/18/2017 1411   MCH 25.8 (L) 03/18/2017 1411   MCHC 31.5 03/18/2017 1411   RDW 15.7 (H) 03/18/2017 1411   Iron Studies No results found for: "IRON", "TIBC", "FERRITIN", "IRONPCTSAT" Lipid Panel     Component Value Date/Time   CHOL 163 03/18/2011 0331   TRIG 114 03/18/2011 0331   HDL 49 03/18/2011 0331   CHOLHDL 3.3 03/18/2011 0331   VLDL 23 03/18/2011 0331   LDLCALC 91 03/18/2011 0331   Hepatic Function Panel     Component Value Date/Time   PROT 7.4 03/05/2017 1206   ALBUMIN 3.7 03/05/2017 1206   AST 14 03/05/2017 1206   ALT 13 03/05/2017 1206   ALKPHOS  73 03/05/2017 1206   BILITOT 0.3 03/05/2017 1206      Component Value Date/Time   TSH 1.270 01/01/2023 0911   Nutritional Lab Results  Component Value Date   VD25OH 52.7 01/01/2023    Follow-Up   No follow-ups on file.Marland Kitchen She was informed of the importance of frequent follow up visits to maximize her success with intensive lifestyle modifications for her multiple health conditions.  Attestation Statement   Reviewed by clinician on day of visit: allergies, medications, problem list, medical history, surgical history, family history, social history, and previous encounter notes.   I have spent 40 minutes in the care of the patient today including: preparing to see patient (e.g. review and interpretation of tests, old notes ), obtaining and/or reviewing separately obtained history, performing a medically appropriate examination or evaluation, counseling and educating the patient, documenting clinical information in the electronic or other health care record, and independently interpreting  results and communicating results to the patient, family, or caregiver   Worthy Rancher, MD

## 2023-01-16 ENCOUNTER — Other Ambulatory Visit (HOSPITAL_COMMUNITY): Payer: Self-pay

## 2023-01-16 MED ORDER — OZEMPIC (2 MG/DOSE) 8 MG/3ML ~~LOC~~ SOPN
2.0000 mg | PEN_INJECTOR | SUBCUTANEOUS | 1 refills | Status: DC
  Filled 2023-01-16: qty 9, 84d supply, fill #0
  Filled 2023-04-08: qty 9, 84d supply, fill #1

## 2023-01-17 ENCOUNTER — Other Ambulatory Visit (HOSPITAL_COMMUNITY): Payer: Self-pay

## 2023-01-26 DIAGNOSIS — G4733 Obstructive sleep apnea (adult) (pediatric): Secondary | ICD-10-CM | POA: Diagnosis not present

## 2023-01-30 ENCOUNTER — Other Ambulatory Visit: Payer: Self-pay | Admitting: Family Medicine

## 2023-01-30 DIAGNOSIS — Z1231 Encounter for screening mammogram for malignant neoplasm of breast: Secondary | ICD-10-CM

## 2023-02-19 ENCOUNTER — Ambulatory Visit (INDEPENDENT_AMBULATORY_CARE_PROVIDER_SITE_OTHER): Payer: 59 | Admitting: Internal Medicine

## 2023-02-27 DIAGNOSIS — H40023 Open angle with borderline findings, high risk, bilateral: Secondary | ICD-10-CM | POA: Diagnosis not present

## 2023-02-27 DIAGNOSIS — E119 Type 2 diabetes mellitus without complications: Secondary | ICD-10-CM | POA: Diagnosis not present

## 2023-02-27 DIAGNOSIS — H35033 Hypertensive retinopathy, bilateral: Secondary | ICD-10-CM | POA: Diagnosis not present

## 2023-02-27 DIAGNOSIS — H2513 Age-related nuclear cataract, bilateral: Secondary | ICD-10-CM | POA: Diagnosis not present

## 2023-03-12 ENCOUNTER — Inpatient Hospital Stay: Payer: 59 | Attending: Hematology and Oncology | Admitting: Hematology and Oncology

## 2023-03-12 ENCOUNTER — Other Ambulatory Visit (HOSPITAL_COMMUNITY): Payer: Self-pay

## 2023-03-12 VITALS — BP 116/65 | HR 86 | Temp 97.6°F | Resp 18 | Ht 67.0 in | Wt 241.4 lb

## 2023-03-12 DIAGNOSIS — Z923 Personal history of irradiation: Secondary | ICD-10-CM | POA: Diagnosis not present

## 2023-03-12 DIAGNOSIS — Z79811 Long term (current) use of aromatase inhibitors: Secondary | ICD-10-CM | POA: Insufficient documentation

## 2023-03-12 DIAGNOSIS — K5909 Other constipation: Secondary | ICD-10-CM | POA: Insufficient documentation

## 2023-03-12 DIAGNOSIS — Z170 Estrogen receptor positive status [ER+]: Secondary | ICD-10-CM | POA: Diagnosis not present

## 2023-03-12 DIAGNOSIS — C50212 Malignant neoplasm of upper-inner quadrant of left female breast: Secondary | ICD-10-CM | POA: Diagnosis not present

## 2023-03-12 MED ORDER — LETROZOLE 2.5 MG PO TABS
2.5000 mg | ORAL_TABLET | Freq: Every day | ORAL | 3 refills | Status: AC
  Filled 2023-03-12: qty 90, 90d supply, fill #0
  Filled 2023-07-10: qty 90, 90d supply, fill #1
  Filled 2023-10-19: qty 90, 90d supply, fill #2
  Filled 2024-01-23: qty 90, 90d supply, fill #3

## 2023-03-12 MED ORDER — DOCUSATE SODIUM 100 MG PO CAPS: 400.0000 mg | ORAL_CAPSULE | Freq: Every day | ORAL | Status: AC

## 2023-03-12 MED ORDER — MULTIVITAMINS PO CAPS: 1.0000 | ORAL_CAPSULE | Freq: Every day | ORAL | Status: AC

## 2023-03-12 MED ORDER — B-12 5000 MCG SL SUBL: 1.0000 | SUBLINGUAL_TABLET | Freq: Every day | SUBLINGUAL | Status: AC

## 2023-03-12 NOTE — Assessment & Plan Note (Signed)
03/21/2017: Left lumpectomy: IDC grade 2, 1.5 cm, DCIS intermediate grade, margins negative, intraductal papilloma and fibrocystic changes, 0/3 lymph nodes negative, ER 100%, PR 20%, HER-2 negative, Ki-67 25%, T1CN0 stage I a Oncotype DX recurrence score 20: 6% risk of recurrence with hormone therapy   Adjuvant radiation therapy started 05/12/2017-06/06/2017 Current treatment: Letrozole 2.5 mg daily x 7 years started 06/28/2017   Letrozole toxicities: Denies any adverse effects to letrozole. Doing very well.   Breast cancer surveillance: 1.  Breast exam 03/12/2023: Benign 2.  Mammogram scheduled for 03/19/2023 3.  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  She also works at [employer] as well as [employer] hospital.  She stays extremely busy. She has been on Ozempic and lost significant weight     Return to clinic in 1 year for follow-up

## 2023-03-12 NOTE — Progress Notes (Signed)
Patient Care Team: Elias Else, MD as PCP - General (Family Medicine) Dorothy Puffer, MD as Consulting Physician (Radiation Oncology) Serena Croissant, MD as Consulting Physician (Hematology and Oncology) Claud Kelp, MD as Consulting Physician (General Surgery) Axel Filler Larna Daughters, NP as Nurse Practitioner (Hematology and Oncology)  DIAGNOSIS:  Encounter Diagnosis  Name Primary?   Malignant neoplasm of upper-inner quadrant of left breast in female, estrogen receptor positive (HCC) Yes    SUMMARY OF ONCOLOGIC HISTORY: Oncology History  Malignant neoplasm of upper-inner quadrant of left breast in female, estrogen receptor positive (HCC)  02/24/2017 Initial Diagnosis   Screening detected left breast mass UIQ 1130 position: 1.9 cm, ultrasound biopsy: IDC grade 2, DCIS, ER 100%, PR 20%, Ki-67 25%, HER-2 negative ratio 1.43, T1 cN0 stage I a AJCC 8   03/17/2017 Genetic Testing   Negative genetic testing on the 9-gene STAT panel.  The STAT Breast cancer panel offered by [facility] includes sequencing and rearrangement analysis for the following 9 genes:  ATM, BRCA1, BRCA2, CDH1, CHEK2, PALB2, PTEN, STK11 and TP53.   The report date is March 17, 2017.  Testing was reflexed to larger common hereditary cancer panel.    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  The Hereditary Gene Panel offered by [facility] includes sequencing and/or deletion duplication testing of the following 47 genes: APC, ATM, AXIN2, BARD1, BMPR1A, BRCA1, BRCA2, BRIP1, CDH1, CDK4, CDKN2A (p14ARF), CDKN2A (p16INK4a), CHEK2, CTNNA1, DICER1, EPCAM (Deletion/duplication testing only), GREM1 (promoter region deletion/duplication testing only), KIT, MEN1, MLH1, MSH2, MSH3, MSH6, MUTYH, NBN, NF1, NHTL1, PALB2, PDGFRA, PMS2, POLD1, POLE, PTEN, RAD50, RAD51C, RAD51D, SDHB, SDHC, SDHD, SMAD4, SMARCA4. STK11, TP53, TSC1, TSC2, and VHL.  The following genes were evaluated for sequence changes only: SDHA and  HOXB13 c.251G>A variant only. The report date is March 19, 2017.    03/21/2017 Surgery   Left lumpectomy: IDC grade 2, 1.5 cm, DCIS intermediate grade, margins negative, intraductal papilloma and fibrocystic changes, 0/3 lymph nodes negative, ER 100%, PR 20%, HER-2 negative, Ki-67 25%, T1CN0 stage I a   04/10/2017 Oncotype testing   Oncotype DX recurrence score 20: 6% risk of recurrence with hormone therapy   05/12/2017 - 06/06/2017 Radiation Therapy   Adjuvant radiation therapy: Left breast treated to 42.5 Gy with 17 fx of 2.5 Gy followed by a boost of 7.5 Gy with 3 fx of 2.5 Gy   06/2017 -  Anti-estrogen oral therapy   Letrozole daily     CHIEF COMPLIANT: Follow-up on letrozole therapy  HISTORY OF PRESENT ILLNESS:  History of Present Illness   Melody Zhang is a 68 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  She is six years post-diagnosis of ductal carcinoma in situ (DCIS) and has undergone surgery. She is currently on letrozole, with one year remaining on this medication, and has no issues with it. Her bone density is within the normal range with a T score of minus one. She is also on Ozempic and is now experiencing weight loss after initially not losing weight.  She is engaged in a [employer] program and has purchased a walking pad and weights. She is considering joining a gym for further physical activity. Her current medications include letrozole, a multivitamin, vitamin B12 (5000 mcg, sublingual), and Colace (400 mg at night for constipation). She takes these medications regularly and notes that the B12 may help with her energy levels.  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         ALLERGIES:  is allergic to dobutamine and  lisinopril.  MEDICATIONS:  Current Outpatient Medications  Medication Sig Dispense Refill   Cyanocobalamin (B-12) 5000 MCG SUBL Place 1 tablet under the tongue daily at 12 noon.     docusate sodium (COLACE) 100 MG capsule Take 4 capsules (400 mg total) by mouth daily.     Multiple Vitamin (MULTIVITAMIN) capsule Take 1 capsule by mouth daily.     aspirin 81 MG chewable tablet Chew 1 tablet by mouth daily.     diltiazem (CARDIZEM) 120 MG tablet Take 1 tablet (120 mg total) by mouth every evening. 100 tablet 1   famotidine (PEPCID) 20 MG tablet Take 40 mg by mouth as needed. at night     glucose blood (FREESTYLE LITE) test strip For use when checking blood sugars daily, alternating mornings and evenings before meals 100 strip 1   ibuprofen (ADVIL) 800 MG tablet Take 1 tablet (800 mg total) by mouth every 8 (eight) hours as needed with food or milk 90 tablet 1   ibuprofen (ADVIL) 800 MG tablet Take 1 tablet (800 mg total) by mouth every 8 (eight) hours with food or milk as needed. 100 tablet 1   Lancets (FREESTYLE) lancets Use as directed to check blood sugars daily alternating mornings and evenings before meals 100 each 1   letrozole (FEMARA) 2.5 MG tablet Take 1 tablet (2.5 mg total) by mouth daily. 90 tablet 3   losartan-hydrochlorothiazide (HYZAAR) 100-25 MG tablet Take 0.5 tablets by mouth daily. 50 tablet 1   losartan-hydrochlorothiazide (HYZAAR) 100-25 MG tablet Take 0.5 tablets by mouth daily. 50 tablet 1   losartan-hydrochlorothiazide (HYZAAR) 100-25 MG tablet Take 1/2 tablet by mouth once daily. 50 tablet 1   nitroGLYCERIN (NITROSTAT) 0.4 MG SL tablet Place 1 tablet (0.4 mg total) under the tongue every 5 (five) minutes x 3 doses as needed for chest pain. 25 tablet 4   potassium chloride (KLOR-CON M) 10 MEQ tablet Take 1 tablet (10 mEq total) by mouth daily. 100 tablet 1   potassium chloride (KLOR-CON M) 10 MEQ tablet Take 1 tablet (10 mEq total) by mouth daily. 100 tablet 1    rosuvastatin (CRESTOR) 20 MG tablet Take 1 tablet (20 mg total) by mouth daily. 100 tablet 3   rosuvastatin (CRESTOR) 40 MG tablet Take 1 tablet (40 mg total) by mouth daily. 100 tablet 0   Semaglutide, 2 MG/DOSE, (OZEMPIC, 2 MG/DOSE,) 8 MG/3ML SOPN Inject 2 mg into the skin once a week. 9 mL 1   Semaglutide, 2 MG/DOSE, (OZEMPIC, 2 MG/DOSE,) 8 MG/3ML SOPN Inject 2 mg into the skin once a week. 9 mL 1   No current facility-administered medications for this visit.    PHYSICAL EXAMINATION: ECOG PERFORMANCE STATUS: 1 - Symptomatic but completely ambulatory  Vitals:   03/12/23 0832  BP: 116/65  Pulse: 86  Resp: 18  Temp: 97.6 F (36.4 C)  SpO2: 100%   Filed Weights   03/12/23 0832  Weight: 241 lb 6.4 oz (109.5 kg)    Physical Exam   BREAST: No masses or tenderness on palpation.      (exam performed in the presence of a chaperone)  LABORATORY DATA:  I have reviewed the data as listed    Latest Ref Rng & Units 03/18/2017  2:11 PM 03/05/2017   12:06 PM 04/12/2011   11:20 AM  CMP  Glucose 65 - 99 mg/dL 99  161  97   BUN 6 - 20 mg/dL 13  22  18    Creatinine 0.44 - 1.00 mg/dL 0.96  0.45  0.8   Sodium 135 - 145 mmol/L 140  140  139   Potassium 3.5 - 5.1 mmol/L 3.5  3.5  3.7   Chloride 101 - 111 mmol/L 102  102  102   CO2 22 - 32 mmol/L 26  26  29    Calcium 8.9 - 10.3 mg/dL 9.7  9.9  9.6   Total Protein 6.4 - 8.3 g/dL  7.4    Total Bilirubin 0.2 - 1.2 mg/dL  0.3    Alkaline Phos 40 - 150 U/L  73    AST 5 - 34 U/L  14    ALT 0 - 55 U/L  13      Lab Results  Component Value Date   WBC 7.4 03/18/2017   HGB 12.0 03/18/2017   HCT 38.1 03/18/2017   MCV 81.8 03/18/2017   PLT 280 03/18/2017   NEUTROABS 7.0 (H) 03/05/2017    ASSESSMENT & PLAN:  Malignant neoplasm of upper-inner quadrant of left breast in female, estrogen receptor positive (HCC) 03/21/2017: Left lumpectomy: IDC grade 2, 1.5 cm, DCIS intermediate grade, margins negative, intraductal papilloma and fibrocystic  changes, 0/3 lymph nodes negative, ER 100%, PR 20%, HER-2 negative, Ki-67 25%, T1CN0 stage I a Oncotype DX recurrence score 20: 6% risk of recurrence with hormone therapy   Adjuvant radiation therapy started 05/12/2017-06/06/2017 Current treatment: Letrozole 2.5 mg daily x 7 years started 06/28/2017   Letrozole toxicities: Denies any adverse effects to letrozole. Doing very well.   Breast cancer surveillance: 1.  Breast exam 03/12/2023: Benign 2.  Mammogram scheduled for 03/19/2023 3.  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  She stays extremely busy. She has been on Ozempic and lost significant weight especially by exercises.  She will plan to join a gym.   Return to clinic in 1 year for follow-up ------------------------------------- Assessment and Plan    Ductal Carcinoma In Situ (DCIS) Six years post-diagnosis of DCIS, status post-surgery. Currently on letrozole with no reported side effects. Bone density within normal range (T score of -1). - Continue letrozole for one more year - Perform annual breast exam  Weight Management Participating in a weight management program and using Ozempic. Initially did not lose weight on Ozempic but is now experiencing weight loss. Purchased a walking pad and weights to increase physical activity. - Encourage continued use of walking pad and weights - Discuss potential benefits of joining a gym  Constipation Chronic constipation, taking 400 mg of Colace nightly without significant relief. - Continue Colace 400 mg nightly  General Health Maintenance Taking a multivitamin (Alive) and Vitamin B12 (5000 mcg, dissolvable). - Continue multivitamin - Continue Vitamin B12 5000 mcg dissolvable.          No orders of the defined types were placed in this encounter.  The patient has a good understanding of the overall plan.  she agrees with it. she will call with any problems that may develop before the next visit here. Total time spent: 30 mins including face to face time and time spent for planning, charting and co-ordination of care  Tamsen Meek, MD 03/12/23

## 2023-03-13 ENCOUNTER — Other Ambulatory Visit: Payer: Self-pay

## 2023-03-13 ENCOUNTER — Other Ambulatory Visit (HOSPITAL_COMMUNITY): Payer: Self-pay

## 2023-03-13 ENCOUNTER — Encounter (INDEPENDENT_AMBULATORY_CARE_PROVIDER_SITE_OTHER): Payer: Self-pay | Admitting: Internal Medicine

## 2023-03-13 ENCOUNTER — Ambulatory Visit (INDEPENDENT_AMBULATORY_CARE_PROVIDER_SITE_OTHER): Payer: 59 | Admitting: Internal Medicine

## 2023-03-13 VITALS — BP 118/81 | HR 77 | Temp 98.5°F | Ht 67.0 in | Wt 236.0 lb

## 2023-03-13 DIAGNOSIS — I10 Essential (primary) hypertension: Secondary | ICD-10-CM | POA: Diagnosis not present

## 2023-03-13 DIAGNOSIS — Z6839 Body mass index (BMI) 39.0-39.9, adult: Secondary | ICD-10-CM

## 2023-03-13 DIAGNOSIS — Z7985 Long-term (current) use of injectable non-insulin antidiabetic drugs: Secondary | ICD-10-CM

## 2023-03-13 DIAGNOSIS — G4733 Obstructive sleep apnea (adult) (pediatric): Secondary | ICD-10-CM

## 2023-03-13 DIAGNOSIS — E66812 Obesity, class 2: Secondary | ICD-10-CM | POA: Diagnosis not present

## 2023-03-13 DIAGNOSIS — E1169 Type 2 diabetes mellitus with other specified complication: Secondary | ICD-10-CM

## 2023-03-13 NOTE — Assessment & Plan Note (Signed)
Asymptomatic.  On Ozempic 2 mg once a week has been on medication for about 2 years.  She is now losing weight secondary to nutritional changes.  We are checking disease monitoring labs today please refer to orders.  She continues to reduce simple and added sugars in her diet

## 2023-03-13 NOTE — Assessment & Plan Note (Signed)
See obesity treatment plan - Continue with current weight management strategy. - Discussed adjustments to overcome identified barriers. - Reinforce dietary and exercise goals. - Follow-up in 4 weeks.

## 2023-03-13 NOTE — Progress Notes (Signed)
Office: [phone]  /  Fax: [phone]  Weight Summary And Biometrics  Vitals Temp: 98.5 F (36.9 C) BP: 118/81 Pulse Rate: 77 SpO2: 98 %   Anthropometric Measurements Height: 5\' 7"  (1.702 m) Weight: 236 lb (107 kg) BMI (Calculated): 36.95 Weight at Last Visit: 245 lb Weight Lost Since Last Visit: 9 lb Weight Gained Since Last Visit: 0 lb Starting Weight: 251 lb Total Weight Loss (lbs): 15 lb (6.804 kg) Peak Weight: 272 lb   Body Composition  Body Fat %: 46.9 % Fat Mass (lbs): 110.8 lbs Muscle Mass (lbs): 119 lbs Total Body Water (lbs): 77.6 lbs Visceral Fat Rating : 15    RMR: [id number]  Today's Visit #: 3  Starting Date: 01/01/23   Subjective   Chief Complaint: Obesity  Melody Zhang is here to discuss her progress with her obesity treatment plan. She is on the the Category 2 Plan and states she is following her eating plan approximately 80 % of the time. She not states she is exercising.  Weight Progress Since Last Visit:  Since last office visit she has lost 9 pounds.  BIA information suggest reduction in body fat percentage and preservation of muscle mass there is also continued improvement in her VAT. She reports good adherence to reduced calorie nutritional plan. She has been working on reading food labels, not skipping meals, increasing protein intake at every meal, drinking more water, making healthier choices, reducing portion sizes, and incorporating more whole foods   Challenges affecting patient progress: low volume of physical activity at present .   Orexigenic Control: Denies problems with appetite and hunger signals.  Denies problems with satiety and satiation.  Denies problems with eating patterns and portion control.  Denies abnormal cravings. Denies feeling deprived or restricted.   Pharmacotherapy for weight management: She is currently taking Ozempic with diabetes as the primary indication with adequate clinical response  and without  side effects..   Assessment and Plan   Treatment Plan For Obesity:  Recommended Dietary Goals  Melody Zhang is currently in the action stage of change. As such, her goal is to continue weight management plan. She has agreed to: continue current plan  Behavioral Health and Counseling  We discussed the following behavioral modification strategies today: continue to work on maintaining a reduced calorie state, getting the recommended amount of protein, incorporating whole foods, making healthy choices, staying well hydrated and practicing mindfulness when eating..  Additional education and resources provided today: None  Recommended Physical Activity Goals  Melody Zhang has been advised to work up to 150 minutes of moderate intensity aerobic activity a week and strengthening exercises 2-3 times per week for cardiovascular health, weight loss maintenance and preservation of muscle mass.   She has agreed to :  Think about enjoyable ways to increase daily physical activity and overcoming barriers to exercise and Increase physical activity in their day and reduce sedentary time (increase NEAT).  Patient considering joining [employer] or using her Silver sneakers benefit.  We also discussed doing exercise at home using YouTube videos and a light set of dumbbells.  Pharmacotherapy  We discussed various medication options to help Melody Zhang with her weight loss efforts and we both agreed to : adequate clinical response to current dose, continue current regimen  Associated Conditions Impacted by Obesity Treatment  Essential hypertension Assessment & Plan: Her blood pressure is well-controlled.  She is currently on losartan hydrochlorothiazide 100 mg 25 mg once a day, diltiazem 120 mg daily.  Losing 10% of body  weight may improve blood pressure control.  Monitor for orthostasis while losing weight.  Continue current regimen.  Check disease monitoring labs today please refer to orders   OSA  (obstructive sleep apnea) Assessment & Plan: On CPAP with reported good compliance. Continue PAP therapy. Losing 15% or more of body weight may improve AHI.  Patient counseled also on the effects of sleep apnea on weight.    Class 2 severe obesity with serious comorbidity and body mass index (BMI) of 39.0 to 39.9 in adult, unspecified obesity type [facility]) Assessment & Plan: See obesity treatment plan - Continue with current weight management strategy. - Discussed adjustments to overcome identified barriers. - Reinforce dietary and exercise goals. - Follow-up in 4 weeks.    Type 2 diabetes mellitus with other specified complication, without long-term current use of insulin (HCC) Assessment & Plan: Asymptomatic.  On Ozempic 2 mg once a week has been on medication for about 2 years.  She is now losing weight secondary to nutritional changes.  We are checking disease monitoring labs today please refer to orders.  She continues to reduce simple and added sugars in her diet  Orders: -     Lipid Panel With LDL/HDL Ratio -     Hemoglobin A1c -     CMP14+EGFR     Objective   Physical Exam:  Blood pressure 118/81, pulse 77, temperature 98.5 F (36.9 C), height 5\' 7"  (1.702 m), weight 236 lb (107 kg), SpO2 98%. Body mass index is 36.96 kg/m.  General: She is overweight, cooperative, alert, well developed, and in no acute distress. PSYCH: Has normal mood, affect and thought process.   HEENT: EOMI, sclerae are anicteric. Lungs: Normal breathing effort, no conversational dyspnea. Extremities: No edema.  Neurologic: No gross sensory or motor deficits. No tremors or fasciculations noted.    Diagnostic Data Reviewed:  BMET    Component Value Date/Time   NA 140 03/18/2017 1411   K 3.5 03/18/2017 1411   CL 102 03/18/2017 1411   CO2 26 03/18/2017 1411   GLUCOSE 99 03/18/2017 1411   BUN 13 03/18/2017 1411   CREATININE 0.93 03/18/2017 1411   CREATININE 0.92 03/05/2017 1206   CALCIUM  9.7 03/18/2017 1411   GFRNONAA >60 03/18/2017 1411   GFRNONAA >60 03/05/2017 1206   GFRAA >60 03/18/2017 1411   GFRAA >60 03/05/2017 1206   Lab Results  Component Value Date   HGBA1C 6.7 (H) 03/18/2017   HGBA1C 6.1 (H) 03/17/2011   Lab Results  Component Value Date   INSULIN 45.1 (H) 01/01/2023   Lab Results  Component Value Date   TSH 1.270 01/01/2023   CBC    Component Value Date/Time   WBC 7.4 03/18/2017 1411   RBC 4.66 03/18/2017 1411   HGB 12.0 03/18/2017 1411   HGB 12.1 03/05/2017 1206   HCT 38.1 03/18/2017 1411   PLT 280 03/18/2017 1411   PLT 252 03/05/2017 1206   MCV 81.8 03/18/2017 1411   MCH 25.8 (L) 03/18/2017 1411   MCHC 31.5 03/18/2017 1411   RDW 15.7 (H) 03/18/2017 1411   Iron Studies No results found for: "IRON", "TIBC", "FERRITIN", "IRONPCTSAT" Lipid Panel     Component Value Date/Time   CHOL 163 03/18/2011 0331   TRIG 114 03/18/2011 0331   HDL 49 03/18/2011 0331   CHOLHDL 3.3 03/18/2011 0331   VLDL 23 03/18/2011 0331   LDLCALC 91 03/18/2011 0331   Hepatic Function Panel     Component Value Date/Time  PROT 7.4 03/05/2017 1206   ALBUMIN 3.7 03/05/2017 1206   AST 14 03/05/2017 1206   ALT 13 03/05/2017 1206   ALKPHOS 73 03/05/2017 1206   BILITOT 0.3 03/05/2017 1206      Component Value Date/Time   TSH 1.270 01/01/2023 0911   Nutritional Lab Results  Component Value Date   VD25OH 52.7 01/01/2023    Follow-Up   No follow-ups on file.Marland Kitchen She was informed of the importance of frequent follow up visits to maximize her success with intensive lifestyle modifications for her multiple health conditions.  Attestation Statement   Reviewed by clinician on day of visit: allergies, medications, problem list, medical history, surgical history, family history, social history, and previous encounter notes.     Worthy Rancher, MD

## 2023-03-13 NOTE — Assessment & Plan Note (Signed)
Her blood pressure is well-controlled.  She is currently on losartan hydrochlorothiazide 100 mg 25 mg once a day, diltiazem 120 mg daily.  Losing 10% of body weight may improve blood pressure control.  Monitor for orthostasis while losing weight.  Continue current regimen.  Check disease monitoring labs today please refer to orders

## 2023-03-13 NOTE — Assessment & Plan Note (Signed)
On CPAP with reported good compliance. Continue PAP therapy. Losing 15% or more of body weight may improve AHI.  Patient counseled also on the effects of sleep apnea on weight.

## 2023-03-14 ENCOUNTER — Other Ambulatory Visit (HOSPITAL_COMMUNITY): Payer: Self-pay

## 2023-03-14 MED ORDER — ROSUVASTATIN CALCIUM 40 MG PO TABS
40.0000 mg | ORAL_TABLET | Freq: Every day | ORAL | 0 refills | Status: DC
  Filled 2023-03-14: qty 90, 90d supply, fill #0
  Filled 2023-07-10: qty 90, 90d supply, fill #1

## 2023-03-19 ENCOUNTER — Ambulatory Visit: Payer: 59

## 2023-03-28 ENCOUNTER — Ambulatory Visit: Payer: 59

## 2023-04-04 ENCOUNTER — Ambulatory Visit
Admission: RE | Admit: 2023-04-04 | Discharge: 2023-04-04 | Disposition: A | Discharge status: Home / Self Care | Payer: 59 | Source: Ambulatory Visit | Attending: Family Medicine | Admitting: Family Medicine

## 2023-04-04 DIAGNOSIS — Z1231 Encounter for screening mammogram for malignant neoplasm of breast: Secondary | ICD-10-CM | POA: Diagnosis not present

## 2023-04-07 ENCOUNTER — Other Ambulatory Visit (HOSPITAL_COMMUNITY): Payer: Self-pay

## 2023-04-08 ENCOUNTER — Other Ambulatory Visit (HOSPITAL_COMMUNITY): Payer: Self-pay

## 2023-04-08 ENCOUNTER — Other Ambulatory Visit: Payer: Self-pay

## 2023-04-14 ENCOUNTER — Encounter (INDEPENDENT_AMBULATORY_CARE_PROVIDER_SITE_OTHER): Payer: Self-pay | Admitting: Internal Medicine

## 2023-04-14 ENCOUNTER — Ambulatory Visit (INDEPENDENT_AMBULATORY_CARE_PROVIDER_SITE_OTHER): Payer: 59 | Admitting: Internal Medicine

## 2023-04-14 VITALS — BP 108/72 | HR 74 | Temp 97.7°F | Ht 67.0 in | Wt 234.0 lb

## 2023-04-14 DIAGNOSIS — Z723 Lack of physical exercise: Secondary | ICD-10-CM

## 2023-04-14 DIAGNOSIS — Z7985 Long-term (current) use of injectable non-insulin antidiabetic drugs: Secondary | ICD-10-CM

## 2023-04-14 DIAGNOSIS — I10 Essential (primary) hypertension: Secondary | ICD-10-CM | POA: Diagnosis not present

## 2023-04-14 DIAGNOSIS — E66812 Obesity, class 2: Secondary | ICD-10-CM | POA: Diagnosis not present

## 2023-04-14 DIAGNOSIS — G4733 Obstructive sleep apnea (adult) (pediatric): Secondary | ICD-10-CM | POA: Diagnosis not present

## 2023-04-14 DIAGNOSIS — Z6839 Body mass index (BMI) 39.0-39.9, adult: Secondary | ICD-10-CM

## 2023-04-14 DIAGNOSIS — E1169 Type 2 diabetes mellitus with other specified complication: Secondary | ICD-10-CM

## 2023-04-14 NOTE — Assessment & Plan Note (Signed)
 See obesity treatment plan - Continue with current weight management strategy. - Discussed adjustments to overcome identified barriers. - Reinforce dietary and exercise goals. - Follow-up in 4 weeks.

## 2023-04-14 NOTE — Assessment & Plan Note (Signed)
 Asymptomatic.  On Ozempic 2 mg once a week has been on medication for about 2 years.  She is now losing weight secondary to nutritional changes.  We are checking disease monitoring labs today please refer to orders.  We again reviewed the carb insulin model today she will continue to work on reducing simple and processed carbs and increasing consumption of fibrous foods.

## 2023-04-14 NOTE — Assessment & Plan Note (Signed)
 Her blood pressure is well-controlled.  She is currently on losartan hydrochlorothiazide 100 mg 25 mg once a day, diltiazem 120 mg daily.  Losing 10% of body weight may improve blood pressure control.  Monitor for orthostasis while losing weight.  Continue current regimen.  Check disease monitoring labs today please refer to orders

## 2023-04-14 NOTE — Assessment & Plan Note (Signed)
 Patient counseled on overcoming barriers to exercise.  She is waiting for the flu season to be over to start going to the gym.  We had discussed doing YouTube videos at home

## 2023-04-14 NOTE — Assessment & Plan Note (Signed)
 On CPAP with reported good compliance. Continue PAP therapy. Losing 15% or more of body weight may improve AHI.  Patient counseled also on the effects of sleep apnea on weight.

## 2023-04-14 NOTE — Progress Notes (Signed)
 Office: [phone]  /  Fax: [phone]  Weight Summary And Biometrics  Vitals Temp: 97.7 F (36.5 C) BP: 108/72 Pulse Rate: 74 SpO2: 99 %   Anthropometric Measurements Height: 5\' 7"  (1.702 m) Weight: 234 lb (106.1 kg) BMI (Calculated): 36.64 Weight at Last Visit: 236 lb Weight Lost Since Last Visit: 2 lb Weight Gained Since Last Visit: 0 Starting Weight: 251 lb Total Weight Loss (lbs): 17 lb (7.711 kg) Peak Weight: 272 lb   Body Composition  Body Fat %: 47.5 % Fat Mass (lbs): 111.2 lbs Muscle Mass (lbs): 116.6 lbs Total Body Water (lbs): 79.4 lbs Visceral Fat Rating : 15    No data recorded Today's Visit #: 4  Starting Date: 01/01/23   Subjective   Chief Complaint: Obesity  Melody Zhang is here to discuss her progress with her obesity treatment plan. She is on the the Category 2 Plan and states she is following her eating plan approximately 60-70% of the time. She states she is not exercising  Weight Progress Since Last Visit:  Since last office visit she has lost 2 pounds. She reports fair adherence to reduced calorie nutritional plan. She has been working on reading food labels, not skipping meals, increasing protein intake at every meal, drinking more water, making healthier choices, reducing portion sizes, and incorporating more whole foods   Challenges affecting patient progress: limited food variation or intolerances, low volume of physical activity at present , medical comorbidities, and presence of obesogenic drugs. Waiting for flu season to be over.  Orexigenic Control: Denies problems with appetite and hunger signals.  Denies problems with satiety and satiation.  Denies problems with eating patterns and portion control.  Denies abnormal cravings. Denies feeling deprived or restricted.   Pharmacotherapy for weight management: She is currently taking Ozempic with diabetes as the primary indication with adequate clinical response  and without  side effects..   Assessment and Plan   Treatment Plan For Obesity:  Recommended Dietary Goals  Melody Zhang is currently in the action stage of change. As such, her goal is to continue weight management plan. She has agreed to: continue current plan  Behavioral Health and Counseling  We discussed the following behavioral modification strategies today: continue to work on maintaining a reduced calorie state, getting the recommended amount of protein, incorporating whole foods, making healthy choices, staying well hydrated and practicing mindfulness when eating..  Additional education and resources provided today: None  Recommended Physical Activity Goals  Melody Zhang has been advised to work up to 150 minutes of moderate intensity aerobic activity a week and strengthening exercises 2-3 times per week for cardiovascular health, weight loss maintenance and preservation of muscle mass.   She has agreed to :  Think about enjoyable ways to increase daily physical activity and overcoming barriers to exercise and Increase physical activity in their day and reduce sedentary time (increase NEAT).  Pharmacotherapy  We discussed various medication options to help Jarrah with her weight loss efforts and we both agreed to : adequate clinical response to current dose, continue current regimen  Associated Conditions Impacted by Obesity Treatment  Essential hypertension Assessment & Plan: Her blood pressure is well-controlled.  She is currently on losartan hydrochlorothiazide 100 mg 25 mg once a day, diltiazem 120 mg daily.  Losing 10% of body weight may improve blood pressure control.  Monitor for orthostasis while losing weight.  Continue current regimen.  Check disease monitoring labs today please refer to orders   OSA (obstructive sleep apnea) Assessment &  Plan: On CPAP with reported good compliance. Continue PAP therapy. Losing 15% or more of body weight may improve AHI.  Patient counseled  also on the effects of sleep apnea on weight.    Class 2 severe obesity with serious comorbidity and body mass index (BMI) of 39.0 to 39.9 in adult, unspecified obesity type [facility]) Assessment & Plan: See obesity treatment plan - Continue with current weight management strategy. - Discussed adjustments to overcome identified barriers. - Reinforce dietary and exercise goals. - Follow-up in 4 weeks.    Type 2 diabetes mellitus with other specified complication, without long-term current use of insulin (HCC) Assessment & Plan: Asymptomatic.  On Ozempic 2 mg once a week has been on medication for about 2 years.  She is now losing weight secondary to nutritional changes.  We are checking disease monitoring labs today please refer to orders.  We again reviewed the carb insulin model today she will continue to work on reducing simple and processed carbs and increasing consumption of fibrous foods.   Physically inactive Assessment & Plan: Patient counseled on overcoming barriers to exercise.  She is waiting for the flu season to be over to start going to the gym.  We had discussed doing YouTube videos at home      Objective   Physical Exam:  Blood pressure 108/72, pulse 74, temperature 97.7 F (36.5 C), height 5\' 7"  (1.702 m), weight 234 lb (106.1 kg), SpO2 99%. Body mass index is 36.65 kg/m.  General: She is overweight, cooperative, alert, well developed, and in no acute distress. PSYCH: Has normal mood, affect and thought process.   HEENT: EOMI, sclerae are anicteric. Lungs: Normal breathing effort, no conversational dyspnea. Extremities: No edema.  Neurologic: No gross sensory or motor deficits. No tremors or fasciculations noted.    Diagnostic Data Reviewed:  BMET    Component Value Date/Time   NA 140 03/18/2017 1411   K 3.5 03/18/2017 1411   CL 102 03/18/2017 1411   CO2 26 03/18/2017 1411   GLUCOSE 99 03/18/2017 1411   BUN 13 03/18/2017 1411   CREATININE 0.93  03/18/2017 1411   CREATININE 0.92 03/05/2017 1206   CALCIUM 9.7 03/18/2017 1411   GFRNONAA >60 03/18/2017 1411   GFRNONAA >60 03/05/2017 1206   GFRAA >60 03/18/2017 1411   GFRAA >60 03/05/2017 1206   Lab Results  Component Value Date   HGBA1C 6.7 (H) 03/18/2017   HGBA1C 6.1 (H) 03/17/2011   Lab Results  Component Value Date   INSULIN 45.1 (H) 01/01/2023   Lab Results  Component Value Date   TSH 1.270 01/01/2023   CBC    Component Value Date/Time   WBC 7.4 03/18/2017 1411   RBC 4.66 03/18/2017 1411   HGB 12.0 03/18/2017 1411   HGB 12.1 03/05/2017 1206   HCT 38.1 03/18/2017 1411   PLT 280 03/18/2017 1411   PLT 252 03/05/2017 1206   MCV 81.8 03/18/2017 1411   MCH 25.8 (L) 03/18/2017 1411   MCHC 31.5 03/18/2017 1411   RDW 15.7 (H) 03/18/2017 1411   Iron Studies No results found for: "IRON", "TIBC", "FERRITIN", "IRONPCTSAT" Lipid Panel     Component Value Date/Time   CHOL 163 03/18/2011 0331   TRIG 114 03/18/2011 0331   HDL 49 03/18/2011 0331   CHOLHDL 3.3 03/18/2011 0331   VLDL 23 03/18/2011 0331   LDLCALC 91 03/18/2011 0331   Hepatic Function Panel     Component Value Date/Time   PROT 7.4 03/05/2017 1206  ALBUMIN 3.7 03/05/2017 1206   AST 14 03/05/2017 1206   ALT 13 03/05/2017 1206   ALKPHOS 73 03/05/2017 1206   BILITOT 0.3 03/05/2017 1206      Component Value Date/Time   TSH 1.270 01/01/2023 0911   Nutritional Lab Results  Component Value Date   VD25OH 52.7 01/01/2023    Medications: Outpatient Encounter Medications as of 04/14/2023  Medication Sig   aspirin 81 MG chewable tablet Chew 1 tablet by mouth daily.   Cyanocobalamin (B-12) 5000 MCG SUBL Place 1 tablet under the tongue daily at 12 noon.   diltiazem (CARDIZEM) 120 MG tablet Take 1 tablet (120 mg total) by mouth every evening.   docusate sodium (COLACE) 100 MG capsule Take 4 capsules (400 mg total) by mouth daily.   famotidine (PEPCID) 20 MG tablet Take 40 mg by mouth as needed. at night    glucose blood (FREESTYLE LITE) test strip For use when checking blood sugars daily, alternating mornings and evenings before meals   ibuprofen (ADVIL) 800 MG tablet Take 1 tablet (800 mg total) by mouth every 8 (eight) hours as needed with food or milk   ibuprofen (ADVIL) 800 MG tablet Take 1 tablet (800 mg total) by mouth every 8 (eight) hours with food or milk as needed.   Lancets (FREESTYLE) lancets Use as directed to check blood sugars daily alternating mornings and evenings before meals   letrozole (FEMARA) 2.5 MG tablet Take 1 tablet (2.5 mg total) by mouth daily.   losartan-hydrochlorothiazide (HYZAAR) 100-25 MG tablet Take 0.5 tablets by mouth daily.   losartan-hydrochlorothiazide (HYZAAR) 100-25 MG tablet Take 0.5 tablets by mouth daily.   losartan-hydrochlorothiazide (HYZAAR) 100-25 MG tablet Take 1/2 tablet by mouth once daily.   Multiple Vitamin (MULTIVITAMIN) capsule Take 1 capsule by mouth daily.   nitroGLYCERIN (NITROSTAT) 0.4 MG SL tablet Place 1 tablet (0.4 mg total) under the tongue every 5 (five) minutes x 3 doses as needed for chest pain.   potassium chloride (KLOR-CON M) 10 MEQ tablet Take 1 tablet (10 mEq total) by mouth daily.   potassium chloride (KLOR-CON M) 10 MEQ tablet Take 1 tablet (10 mEq total) by mouth daily.   rosuvastatin (CRESTOR) 20 MG tablet Take 1 tablet (20 mg total) by mouth daily.   rosuvastatin (CRESTOR) 40 MG tablet Take 1 tablet (40 mg total) by mouth daily.   Semaglutide, 2 MG/DOSE, (OZEMPIC, 2 MG/DOSE,) 8 MG/3ML SOPN Inject 2 mg into the skin once a week.   Semaglutide, 2 MG/DOSE, (OZEMPIC, 2 MG/DOSE,) 8 MG/3ML SOPN Inject 2 mg into the skin once a week.   No facility-administered encounter medications on file as of 04/14/2023.     Follow-Up   Return in about 3 weeks (around 05/05/2023) for For Weight Mangement with Dr. Rikki Spearing.Marland Kitchen She was informed of the importance of frequent follow up visits to maximize her success with intensive lifestyle  modifications for her multiple health conditions.  Attestation Statement   Reviewed by clinician on day of visit: allergies, medications, problem list, medical history, surgical history, family history, social history, and previous encounter notes.     Worthy Rancher, MD

## 2023-04-15 ENCOUNTER — Encounter (INDEPENDENT_AMBULATORY_CARE_PROVIDER_SITE_OTHER): Payer: Self-pay | Admitting: Internal Medicine

## 2023-04-15 LAB — CMP14+EGFR
ALT: 21 IU/L (ref 0–32)
AST: 18 IU/L (ref 0–40)
Albumin: 4.1 g/dL (ref 3.9–4.9)
Alkaline Phosphatase: 81 IU/L (ref 44–121)
BUN/Creatinine Ratio: 15 (ref 12–28)
BUN: 12 mg/dL (ref 8–27)
Bilirubin Total: 0.2 mg/dL (ref 0.0–1.2)
CO2: 23 mmol/L (ref 20–29)
Calcium: 9.8 mg/dL (ref 8.7–10.3)
Chloride: 104 mmol/L (ref 96–106)
Creatinine, Ser: 0.78 mg/dL (ref 0.57–1.00)
Globulin, Total: 3 g/dL (ref 1.5–4.5)
Glucose: 90 mg/dL (ref 70–99)
Potassium: 3.8 mmol/L (ref 3.5–5.2)
Sodium: 144 mmol/L (ref 134–144)
Total Protein: 7.1 g/dL (ref 6.0–8.5)
eGFR: 83 mL/min/{1.73_m2} (ref >59)

## 2023-04-15 LAB — LIPID PANEL WITH LDL/HDL RATIO
Cholesterol, Total: 149 mg/dL (ref 100–199)
HDL: 50 mg/dL (ref >39)
LDL Chol Calc (NIH): 81 mg/dL (ref 0–99)
LDL/HDL Ratio: 1.6 ratio (ref 0.0–3.2)
Triglycerides: 95 mg/dL (ref 0–149)
VLDL Cholesterol Cal: 18 mg/dL (ref 5–40)

## 2023-04-15 LAB — HEMOGLOBIN A1C
Est. average glucose Bld gHb Est-mCnc: 131 mg/dL
Hgb A1c MFr Bld: 6.2 % — ABNORMAL HIGH (ref 4.8–5.6)

## 2023-05-01 ENCOUNTER — Other Ambulatory Visit (HOSPITAL_COMMUNITY): Payer: Self-pay

## 2023-05-01 DIAGNOSIS — G4733 Obstructive sleep apnea (adult) (pediatric): Secondary | ICD-10-CM | POA: Diagnosis not present

## 2023-05-01 DIAGNOSIS — H35033 Hypertensive retinopathy, bilateral: Secondary | ICD-10-CM | POA: Diagnosis not present

## 2023-05-01 DIAGNOSIS — E7800 Pure hypercholesterolemia, unspecified: Secondary | ICD-10-CM | POA: Diagnosis not present

## 2023-05-01 DIAGNOSIS — Z6836 Body mass index (BMI) 36.0-36.9, adult: Secondary | ICD-10-CM | POA: Diagnosis not present

## 2023-05-01 DIAGNOSIS — E1159 Type 2 diabetes mellitus with other circulatory complications: Secondary | ICD-10-CM | POA: Diagnosis not present

## 2023-05-01 DIAGNOSIS — Z0000 Encounter for general adult medical examination without abnormal findings: Secondary | ICD-10-CM | POA: Diagnosis not present

## 2023-05-01 DIAGNOSIS — I10 Essential (primary) hypertension: Secondary | ICD-10-CM | POA: Diagnosis not present

## 2023-05-01 MED ORDER — LOSARTAN POTASSIUM-HCTZ 50-12.5 MG PO TABS
1.0000 | ORAL_TABLET | Freq: Every day | ORAL | 1 refills | Status: DC
  Filled 2023-05-01: qty 90, 90d supply, fill #0
  Filled 2023-07-10 – 2023-08-29 (×3): qty 90, 90d supply, fill #1
  Filled 2023-10-19: qty 90, 90d supply, fill #2

## 2023-05-06 ENCOUNTER — Other Ambulatory Visit (HOSPITAL_COMMUNITY): Payer: Self-pay

## 2023-05-06 MED ORDER — TETANUS-DIPHTH-ACELL PERTUSSIS 5-2.5-18.5 LF-MCG/0.5 IM SUSY
0.5000 mL | PREFILLED_SYRINGE | Freq: Once | INTRAMUSCULAR | 0 refills | Status: AC
  Filled 2023-05-06: qty 0.5, 1d supply, fill #0

## 2023-05-06 MED ORDER — ZOSTER VAC RECOMB ADJUVANTED 50 MCG/0.5ML IM SUSR
INTRAMUSCULAR | 0 refills | Status: AC
  Filled 2023-05-06: qty 0.5, 1d supply, fill #0

## 2023-05-14 ENCOUNTER — Ambulatory Visit (INDEPENDENT_AMBULATORY_CARE_PROVIDER_SITE_OTHER): Admitting: Internal Medicine

## 2023-07-10 ENCOUNTER — Other Ambulatory Visit: Payer: Self-pay

## 2023-07-10 ENCOUNTER — Other Ambulatory Visit (HOSPITAL_COMMUNITY): Payer: Self-pay

## 2023-07-11 ENCOUNTER — Other Ambulatory Visit (HOSPITAL_COMMUNITY): Payer: Self-pay

## 2023-07-11 ENCOUNTER — Other Ambulatory Visit: Payer: Self-pay

## 2023-07-11 MED ORDER — DILTIAZEM HCL 120 MG PO TABS
120.0000 mg | ORAL_TABLET | Freq: Every evening | ORAL | 1 refills | Status: DC
  Filled 2023-07-11: qty 90, 90d supply, fill #0
  Filled 2023-10-19: qty 90, 90d supply, fill #1
  Filled 2024-01-23: qty 90, 90d supply, fill #2

## 2023-07-11 MED ORDER — ROSUVASTATIN CALCIUM 40 MG PO TABS
40.0000 mg | ORAL_TABLET | Freq: Every day | ORAL | 0 refills | Status: DC
  Filled 2023-07-11: qty 90, 90d supply, fill #0
  Filled 2023-10-19: qty 90, 90d supply, fill #1

## 2023-07-16 ENCOUNTER — Other Ambulatory Visit (HOSPITAL_COMMUNITY): Payer: Self-pay

## 2023-07-16 MED ORDER — OZEMPIC (2 MG/DOSE) 8 MG/3ML ~~LOC~~ SOPN
2.0000 mg | PEN_INJECTOR | SUBCUTANEOUS | 1 refills | Status: AC
  Filled 2023-07-16: qty 9, 84d supply, fill #0
  Filled 2023-10-19: qty 9, 84d supply, fill #1

## 2023-07-31 DIAGNOSIS — G4733 Obstructive sleep apnea (adult) (pediatric): Secondary | ICD-10-CM | POA: Diagnosis not present

## 2023-08-05 ENCOUNTER — Other Ambulatory Visit (HOSPITAL_COMMUNITY): Payer: Self-pay

## 2023-08-29 ENCOUNTER — Other Ambulatory Visit (HOSPITAL_COMMUNITY): Payer: Self-pay

## 2023-08-29 ENCOUNTER — Other Ambulatory Visit: Payer: Self-pay

## 2023-08-31 ENCOUNTER — Other Ambulatory Visit (HOSPITAL_COMMUNITY): Payer: Self-pay

## 2023-08-31 MED ORDER — POTASSIUM CHLORIDE CRYS ER 10 MEQ PO TBCR
10.0000 meq | EXTENDED_RELEASE_TABLET | Freq: Every day | ORAL | 1 refills | Status: AC
  Filled 2023-08-31 (×2): qty 90, 90d supply, fill #0
  Filled 2023-12-06: qty 90, 90d supply, fill #1

## 2023-09-01 ENCOUNTER — Other Ambulatory Visit (HOSPITAL_COMMUNITY): Payer: Self-pay

## 2023-09-02 ENCOUNTER — Other Ambulatory Visit (HOSPITAL_COMMUNITY): Payer: Self-pay

## 2023-10-20 ENCOUNTER — Other Ambulatory Visit (HOSPITAL_COMMUNITY): Payer: Self-pay

## 2023-10-20 ENCOUNTER — Other Ambulatory Visit: Payer: Self-pay

## 2023-10-21 ENCOUNTER — Other Ambulatory Visit (HOSPITAL_COMMUNITY): Payer: Self-pay

## 2023-10-22 ENCOUNTER — Other Ambulatory Visit (HOSPITAL_COMMUNITY): Payer: Self-pay

## 2023-10-22 MED ORDER — ROSUVASTATIN CALCIUM 40 MG PO TABS
40.0000 mg | ORAL_TABLET | Freq: Every day | ORAL | 0 refills | Status: DC
  Filled 2023-10-22: qty 90, 90d supply, fill #0
  Filled 2024-01-23: qty 90, 90d supply, fill #1

## 2023-10-23 ENCOUNTER — Other Ambulatory Visit (HOSPITAL_COMMUNITY): Payer: Self-pay

## 2023-10-23 MED ORDER — LOSARTAN POTASSIUM-HCTZ 50-12.5 MG PO TABS
1.0000 | ORAL_TABLET | Freq: Every day | ORAL | 1 refills | Status: AC
  Filled 2023-12-02: qty 90, 90d supply, fill #0
  Filled 2024-01-23: qty 90, 90d supply, fill #1

## 2023-10-24 ENCOUNTER — Other Ambulatory Visit (HOSPITAL_COMMUNITY): Payer: Self-pay

## 2023-12-02 ENCOUNTER — Other Ambulatory Visit (HOSPITAL_COMMUNITY): Payer: Self-pay

## 2023-12-02 ENCOUNTER — Other Ambulatory Visit: Payer: Self-pay

## 2023-12-04 ENCOUNTER — Other Ambulatory Visit (HOSPITAL_COMMUNITY): Payer: Self-pay

## 2023-12-06 ENCOUNTER — Other Ambulatory Visit (HOSPITAL_COMMUNITY): Payer: Self-pay

## 2024-01-23 ENCOUNTER — Other Ambulatory Visit: Payer: Self-pay

## 2024-01-23 ENCOUNTER — Other Ambulatory Visit (HOSPITAL_COMMUNITY): Payer: Self-pay

## 2024-01-23 MED ORDER — OZEMPIC (2 MG/DOSE) 8 MG/3ML ~~LOC~~ SOPN
2.0000 mg | PEN_INJECTOR | SUBCUTANEOUS | 0 refills | Status: AC
  Filled 2024-01-23: qty 9, 84d supply, fill #0

## 2024-01-23 MED ORDER — ROSUVASTATIN CALCIUM 40 MG PO TABS
40.0000 mg | ORAL_TABLET | Freq: Every day | ORAL | 0 refills | Status: AC
  Filled 2024-01-23: qty 90, 90d supply, fill #0

## 2024-01-23 MED ORDER — DILTIAZEM HCL 120 MG PO TABS
120.0000 mg | ORAL_TABLET | Freq: Every evening | ORAL | 0 refills | Status: AC
  Filled 2024-01-23: qty 90, 90d supply, fill #0

## 2024-03-04 ENCOUNTER — Other Ambulatory Visit: Payer: Self-pay

## 2024-03-04 DIAGNOSIS — Z1231 Encounter for screening mammogram for malignant neoplasm of breast: Secondary | ICD-10-CM

## 2024-03-11 ENCOUNTER — Inpatient Hospital Stay: Payer: 59 | Admitting: Hematology and Oncology

## 2024-04-16 ENCOUNTER — Ambulatory Visit
